# Patient Record
Sex: Female | Born: 1940 | Race: White | Hispanic: No | Marital: Married | Smoking: Current every day smoker
Health system: Southern US, Community
[De-identification: ages and names within clinical notes are randomized; demographics above are authoritative.]

## PROBLEM LIST (undated history)

## (undated) DIAGNOSIS — J449 Chronic obstructive pulmonary disease, unspecified: Secondary | ICD-10-CM

## (undated) DIAGNOSIS — A419 Sepsis, unspecified organism: Secondary | ICD-10-CM

## (undated) DIAGNOSIS — IMO0002 Reserved for concepts with insufficient information to code with codable children: Secondary | ICD-10-CM

## (undated) DIAGNOSIS — K2970 Gastritis, unspecified, without bleeding: Secondary | ICD-10-CM

## (undated) DIAGNOSIS — I209 Angina pectoris, unspecified: Secondary | ICD-10-CM

## (undated) DIAGNOSIS — E7800 Pure hypercholesterolemia, unspecified: Secondary | ICD-10-CM

## (undated) DIAGNOSIS — F419 Anxiety disorder, unspecified: Secondary | ICD-10-CM

## (undated) DIAGNOSIS — K224 Dyskinesia of esophagus: Secondary | ICD-10-CM

## (undated) DIAGNOSIS — K5792 Diverticulitis of intestine, part unspecified, without perforation or abscess without bleeding: Secondary | ICD-10-CM

## (undated) DIAGNOSIS — G2581 Restless legs syndrome: Secondary | ICD-10-CM

## (undated) DIAGNOSIS — I10 Essential (primary) hypertension: Secondary | ICD-10-CM

## (undated) DIAGNOSIS — K589 Irritable bowel syndrome without diarrhea: Secondary | ICD-10-CM

## (undated) DIAGNOSIS — J45909 Unspecified asthma, uncomplicated: Secondary | ICD-10-CM

## (undated) DIAGNOSIS — K219 Gastro-esophageal reflux disease without esophagitis: Secondary | ICD-10-CM

## (undated) DIAGNOSIS — IMO0001 Reserved for inherently not codable concepts without codable children: Secondary | ICD-10-CM

## (undated) DIAGNOSIS — I2510 Atherosclerotic heart disease of native coronary artery without angina pectoris: Secondary | ICD-10-CM

## (undated) HISTORY — DX: Anxiety disorder, unspecified: F41.9

## (undated) HISTORY — DX: Gastritis, unspecified, without bleeding: K29.70

## (undated) HISTORY — DX: Irritable bowel syndrome, unspecified: K58.9

## (undated) HISTORY — PX: ABDOMINAL HYSTERECTOMY: SHX81

## (undated) HISTORY — DX: Pure hypercholesterolemia, unspecified: E78.00

## (undated) HISTORY — DX: Essential (primary) hypertension: I10

## (undated) HISTORY — PX: OTHER SURGICAL HISTORY: SHX169

## (undated) HISTORY — PX: HERNIA REPAIR: SHX51

## (undated) HISTORY — DX: Chronic obstructive pulmonary disease, unspecified: J44.9

## (undated) HISTORY — DX: Dyskinesia of esophagus: K22.4

## (undated) HISTORY — PX: BLADDER SURGERY: SHX569

## (undated) HISTORY — DX: Diverticulitis of intestine, part unspecified, without perforation or abscess without bleeding: K57.92

## (undated) HISTORY — PX: APPENDECTOMY: SHX54

## (undated) HISTORY — DX: Reserved for concepts with insufficient information to code with codable children: IMO0002

## (undated) HISTORY — PX: CORONARY STENT PLACEMENT: SHX1402

---

## 2000-01-07 ENCOUNTER — Encounter: Payer: Self-pay | Admitting: Cardiology

## 2000-01-07 ENCOUNTER — Inpatient Hospital Stay (HOSPITAL_COMMUNITY): Admission: AD | Admit: 2000-01-07 | Discharge: 2000-01-09 | Payer: Self-pay | Admitting: Cardiology

## 2000-01-09 ENCOUNTER — Encounter: Payer: Self-pay | Admitting: Cardiology

## 2005-08-02 ENCOUNTER — Ambulatory Visit: Payer: Self-pay | Admitting: Cardiology

## 2005-08-09 ENCOUNTER — Ambulatory Visit: Payer: Self-pay

## 2005-08-12 ENCOUNTER — Ambulatory Visit: Payer: Self-pay | Admitting: Cardiology

## 2005-09-23 ENCOUNTER — Ambulatory Visit: Payer: Self-pay | Admitting: Cardiology

## 2011-11-23 ENCOUNTER — Institutional Professional Consult (permissible substitution): Payer: Self-pay | Admitting: Cardiology

## 2011-12-21 ENCOUNTER — Ambulatory Visit (INDEPENDENT_AMBULATORY_CARE_PROVIDER_SITE_OTHER): Payer: Medicare PPO | Admitting: Pulmonary Disease

## 2011-12-21 ENCOUNTER — Encounter: Payer: Self-pay | Admitting: Pulmonary Disease

## 2011-12-21 VITALS — BP 108/62 | HR 62 | Temp 98.6°F | Ht 60.0 in | Wt 120.8 lb

## 2011-12-21 DIAGNOSIS — R911 Solitary pulmonary nodule: Secondary | ICD-10-CM | POA: Insufficient documentation

## 2011-12-21 DIAGNOSIS — J449 Chronic obstructive pulmonary disease, unspecified: Secondary | ICD-10-CM | POA: Insufficient documentation

## 2011-12-21 DIAGNOSIS — F17200 Nicotine dependence, unspecified, uncomplicated: Secondary | ICD-10-CM

## 2011-12-21 DIAGNOSIS — R490 Dysphonia: Secondary | ICD-10-CM | POA: Insufficient documentation

## 2011-12-21 DIAGNOSIS — Z720 Tobacco use: Secondary | ICD-10-CM | POA: Insufficient documentation

## 2011-12-21 MED ORDER — TIOTROPIUM BROMIDE MONOHYDRATE 18 MCG IN CAPS: 18.0000 ug | ORAL_CAPSULE | Freq: Every day | RESPIRATORY_TRACT | Status: DC

## 2011-12-21 NOTE — Assessment & Plan Note (Signed)
She has noticed more hoarseness since her recent hernia surgery in March 2013.  From her description it seems like she had endotracheal intubation for her procedure.  If her symptoms persist, she may need further evaluation by ENT.  Explained how smoking is likely affecting this as well.

## 2011-12-21 NOTE — Patient Instructions (Signed)
Will schedule PET scan Will schedule breathing test (PFT) Spiriva one puff daily Albuterol two puffs as needed for cough, wheeze, or chest congestion Follow up in 2 to 3 weeks

## 2011-12-21 NOTE — Assessment & Plan Note (Signed)
She was found to have pulmonary nodule through CT chest from June 2012 during research protocol at [facility].  She was noted to have persistence of lesion on CT abdomen from February 2013.  She has an extensive history of smoking.  She does not have any other imaging studies predating these tests available for review.  To further assess will arrange for PET scan.  Will then determine if further radiographic monitoring is indicated, or if she needs to proceed with tissue sampling.

## 2011-12-21 NOTE — Progress Notes (Deleted)
  Subjective:    Patient ID: Melinda Schroeder, female    DOB: 20-May-1941, 71 y.o.   MRN: [id number]  HPI    Review of Systems  Constitutional: Positive for appetite change and unexpected weight change.  HENT: Negative for congestion, sore throat, trouble swallowing and dental problem.   Respiratory: Positive for cough. Negative for shortness of breath.   Cardiovascular: Negative for chest pain, palpitations and leg swelling.  Gastrointestinal: Negative for abdominal pain.  Musculoskeletal: Negative for joint swelling.  Skin: Negative for rash.  Neurological: Negative for headaches.  Psychiatric/Behavioral: Negative for dysphoric mood. The patient is nervous/anxious.        Objective:   Physical Exam        Assessment & Plan:

## 2011-12-21 NOTE — Assessment & Plan Note (Signed)
She had spirometry in June 2012 which showed severe obstruction.  To further assess will arrange for full pulmonary function testing.  Will add spiriva to her inhaler regimen.  I have given her a sample of this.  Will continue albuterol as needed.

## 2011-12-21 NOTE — Assessment & Plan Note (Signed)
Explained the importance of smoking cessation.  Reviewed different treatment options.  She was intolerant of chantix, and did not have benefit from nicotine patch.  She will think about her options and discuss further at next visit.

## 2011-12-21 NOTE — Progress Notes (Signed)
Chief Complaint  Patient presents with  . Advice Only    Lung mass per Dr. Veatrice Kells    History of Present Illness: Melinda Schroeder is a 71 y.o. female for evaluation of pulmonary nodule.  She has extensive history of smoking.  She started smoking in her 57's.  She smoked up to 1 pack per day, and currently smokes 1/2 pack per day.    She is enrolled in a research protocol through [facility].  She had CT chest through the protocol from June 2012.  This apparently was not reviewed until January 2013.  This showed 1.3 cm right lower lobe spiculated nodule.  She had CT abdomen in February 2013 prior to hernia surgery which showed persistence of right lower lobe 1.1 cm nodule.  She was then advised to have further evaluation with pulmonary.  She does not recall having any other chest imaging studies prior to June 2012.    She also had spirometry in June 2012 which showed severe obstruction (PFT 03/02/11>>FEV1 0.98(51%), FEV1% 58).  She has an albuterol inhaler, but does not use this much.  She gets occasional cough.  She does not have wheeze, sputum, or hemoptysis.  She gets winded if she walks up stairs, but does okay on level ground.  She does not think her breathing limits her activity.  She has lost about 10 lbs over the past year.    She has noticed more trouble with hoarseness since she had her hernia surgery in March 2013.  She believes she was intubated for the procedure.  There is no history of asthma.  She had pneumonia years ago.  She denies history of TB.  She recalls having PPD at work years ago, and was told this was negative. She gets bronchitis about once per year.  She uses to work in [employer], and now works as a Interior and spatial designer.  She has a Development worker, international aid, and denies other animal exposures.   Past Medical History  Diagnosis Date  . Diverticulitis   . Gastritis   . Hypercholesterolemia   . Hypertension   . Anxiety   . COPD (chronic obstructive pulmonary disease)     . DDD (degenerative disc disease)   . IBS (irritable bowel syndrome)   . Gout   . Esophageal dysmotilities     Past Surgical History  Procedure Date  . Abdominal hysterectomy   . Bladder surgery   . Breast mass right excision   . Hernia repair   . Coronary stent placement     Current Outpatient Prescriptions on File Prior to Visit  Medication Sig Dispense Refill  . albuterol (PROVENTIL HFA;VENTOLIN HFA) 108 (90 BASE) MCG/ACT inhaler Inhale 2 puffs into the lungs every 6 (six) hours as needed.      [name] losartan-hydrochlorothiazide (HYZAAR) 100-12.5 MG per tablet Take 1 tablet by mouth daily.      . metoprolol succinate (TOPROL-XL) 100 MG 24 hr tablet Take 100 mg by mouth 2 (two) times daily. Take with or immediately following a meal.      . omeprazole (PRILOSEC) 20 MG capsule Take 20 mg by mouth daily.      [name] PARoxetine (PAXIL) 20 MG tablet Take 20 mg by mouth daily.      . simvastatin (ZOCOR) 80 MG tablet Take 80 mg by mouth at bedtime.        Allergies  Allergen Reactions  . Ace Inhibitors     cough  . Sulfa Antibiotics  nausea    family history includes Emphysema in her father; Stomach cancer in her brother; and Stroke in her mother.   reports that she has been smoking.  She does not have any smokeless tobacco history on file. She reports that she does not drink alcohol or use illicit drugs.  Review of Systems  Constitutional: Positive for appetite change and unexpected weight change.  HENT: Negative for congestion, sore throat, trouble swallowing and dental problem.   Respiratory: Positive for cough. Negative for shortness of breath.   Cardiovascular: Negative for chest pain, palpitations and leg swelling.  Gastrointestinal: Negative for abdominal pain.  Musculoskeletal: Negative for joint swelling.  Skin: Negative for rash.  Neurological: Negative for headaches.  Psychiatric/Behavioral: Negative for dysphoric mood. The patient is nervous/anxious.     Physical  Exam: BP 108/62  Pulse 62  Temp(Src) 98.6 F (37 C) (Oral)  Ht 5' (1.524 m)  Wt 120 lb 12.8 oz (54.795 kg)  BMI 23.59 kg/m2  SpO2 96% Body mass index is 23.59 kg/(m^2).  General - Thin, no distress, speaks in full sentences, deep voice quality HEENT - Wears glasses, PERRLA, EOMI, wears glasses, no sinus tenderness, no oral exudate, MP 3, no LAN Cardiac - s1s2 regular, no murmur Chest - prolonged exhalation, normal respiratory excursion, no wheeze/rales/dullness Abdomen - soft, nontender, normal bowel sounds Extremities - no e/c/c Neurologic - normal strength, CN intact Skin - no rashes Psychiatric - normal mood, behavior  Assessment/Plan:  Outpatient Encounter Prescriptions as of 12/21/2011  Medication Sig Dispense Refill  . albuterol (PROVENTIL HFA;VENTOLIN HFA) 108 (90 BASE) MCG/ACT inhaler Inhale 2 puffs into the lungs every 6 (six) hours as needed.      [name] losartan-hydrochlorothiazide (HYZAAR) 100-12.5 MG per tablet Take 1 tablet by mouth daily.      . metoprolol succinate (TOPROL-XL) 100 MG 24 hr tablet Take 100 mg by mouth 2 (two) times daily. Take with or immediately following a meal.      . omeprazole (PRILOSEC) 20 MG capsule Take 20 mg by mouth daily.      [name] PARoxetine (PAXIL) 20 MG tablet Take 20 mg by mouth daily.      . simvastatin (ZOCOR) 80 MG tablet Take 80 mg by mouth at bedtime.        Melinda Schroeder Pager:  [phone] 12/21/2011, 4:22 PM

## 2012-01-03 ENCOUNTER — Encounter (HOSPITAL_COMMUNITY)
Admission: RE | Admit: 2012-01-03 | Discharge: 2012-01-03 | Disposition: A | Discharge status: Home / Self Care | Payer: Medicare PPO | Source: Ambulatory Visit | Attending: Pulmonary Disease | Admitting: Pulmonary Disease

## 2012-01-03 DIAGNOSIS — R222 Localized swelling, mass and lump, trunk: Secondary | ICD-10-CM | POA: Insufficient documentation

## 2012-01-03 DIAGNOSIS — R911 Solitary pulmonary nodule: Secondary | ICD-10-CM

## 2012-01-03 DIAGNOSIS — J984 Other disorders of lung: Secondary | ICD-10-CM | POA: Insufficient documentation

## 2012-01-03 LAB — GLUCOSE, CAPILLARY: Glucose-Capillary: 103 mg/dL — ABNORMAL HIGH (ref 70–99)

## 2012-01-03 MED ORDER — FLUDEOXYGLUCOSE F - 18 (FDG) INJECTION
18.4000 | Freq: Once | INTRAVENOUS | Status: AC | PRN
  Administered 2012-01-03: 18.4 via INTRAVENOUS

## 2012-01-04 ENCOUNTER — Telehealth: Payer: Self-pay | Admitting: Pulmonary Disease

## 2012-01-04 NOTE — Telephone Encounter (Signed)
Nm Pet Image Initial (pi) Skull Base To Thigh  01/03/2012  *RADIOLOGY REPORT*  Clinical Data:  Recent imaging demonstrates a solitary pulmonary nodule.  FDG PET CT requested to evaluate for possible malignancy.  NUCLEAR MEDICINE PET SKULL BASE TO THIGH  Fasting Blood Glucose:  103  Technique:  18.4 mCi F-18 FDG was injected intravenously.  CT data was obtained and used for attenuation correction and anatomic localization only.  (This was not acquired as a diagnostic CT examination.) Additional exam technical data entered on technologist worksheet.  Comparison:  None  Findings:  Head/Neck:  No hypermetabolic lymph nodes in the neck.  Chest:  12 mm smoothly marginated pulmonary nodule in the right lung base shows no hypermetabolic activity, suggesting a benign etiology.  No other suspicious pulmonary nodules are identified on CT images.  No hypermetabolic mediastinal or hilar nodes.  Abdomen/Pelvis:  No abnormal hypermetabolic activity within the liver, pancreas, adrenal glands, or spleen.  No hypermetabolic lymph nodes in the abdomen or pelvis.  Skeleton:  No focal hypermetabolic activity to suggest skeletal metastasis.  IMPRESSION: 12 mm right lower lobe pulmonary nodule shows no metabolic activity, suggesting a benign etiology.  Follow-up by chest CT is recommended in 6 months to confirm stability of this nodule.  Original Report Authenticated By: Danae Orleans, M.D.    Will have my nurse inform patient that PET scan was negative.  This is very reassuring.  Will discuss in more detail at visit on 01/07/12.

## 2012-01-04 NOTE — Telephone Encounter (Signed)
[id number] x1 

## 2012-01-05 NOTE — Telephone Encounter (Signed)
Pt informed of PET scan results per Dr Craige Cotta and will f/u with him in office on 01-07-12.

## 2012-01-07 ENCOUNTER — Ambulatory Visit (INDEPENDENT_AMBULATORY_CARE_PROVIDER_SITE_OTHER): Payer: Medicare PPO | Admitting: Pulmonary Disease

## 2012-01-07 ENCOUNTER — Encounter: Payer: Self-pay | Admitting: Pulmonary Disease

## 2012-01-07 VITALS — BP 132/70 | HR 66 | Temp 98.0°F | Ht 60.0 in | Wt 118.0 lb

## 2012-01-07 DIAGNOSIS — J449 Chronic obstructive pulmonary disease, unspecified: Secondary | ICD-10-CM

## 2012-01-07 DIAGNOSIS — Z720 Tobacco use: Secondary | ICD-10-CM

## 2012-01-07 DIAGNOSIS — J4489 Other specified chronic obstructive pulmonary disease: Secondary | ICD-10-CM

## 2012-01-07 DIAGNOSIS — R911 Solitary pulmonary nodule: Secondary | ICD-10-CM

## 2012-01-07 DIAGNOSIS — R490 Dysphonia: Secondary | ICD-10-CM

## 2012-01-07 DIAGNOSIS — F17200 Nicotine dependence, unspecified, uncomplicated: Secondary | ICD-10-CM

## 2012-01-07 LAB — PULMONARY FUNCTION TEST

## 2012-01-07 NOTE — Assessment & Plan Note (Signed)
Her PFT today is better compared to her spirometry from June 2012.  She has GOLD 1 COPD.  She does not have much as far as respiratory symptoms.  Will have her stop spiriva.  She can continue as needed albuterol.

## 2012-01-07 NOTE — Assessment & Plan Note (Signed)
Improved.  Will monitor clinically, and then decide if she needs evaluation by ENT.

## 2012-01-07 NOTE — Patient Instructions (Signed)
Stop spiriva Follow up in 6 months after CT chest

## 2012-01-07 NOTE — Progress Notes (Signed)
PFT done today. 

## 2012-01-07 NOTE — Progress Notes (Signed)
Chief Complaint  Patient presents with  . Follow-up    w/ PFT. Pt states her breathing has been fine. pt also wants to discuss pet scan    History of Present Illness: Melinda Schroeder is a 71 y.o. female smoker with COPD, pulmonary nodule, and hoarseness.  Her breathing has been okay.  She is not having cough, wheeze, chest congestion, or chest tightness.  She does not feel like her breathing causes trouble with her activity.  She is not using her spiriva much.  She does not think this helps much.   Past Medical History  Diagnosis Date  . Diverticulitis   . Gastritis   . Hypercholesterolemia   . Hypertension   . Anxiety   . COPD (chronic obstructive pulmonary disease)   . DDD (degenerative disc disease)   . IBS (irritable bowel syndrome)   . Gout   . Esophageal dysmotilities     Past Surgical History  Procedure Date  . Abdominal hysterectomy   . Bladder surgery   . Breast mass right excision   . Hernia repair   . Coronary stent placement     Allergies  Allergen Reactions  . Ace Inhibitors     cough  . Sulfa Antibiotics     nausea    Physical Exam:  Blood pressure 132/70, pulse 66, temperature 98 F (36.7 C), temperature source Oral, height 5' (1.524 m), weight 118 lb (53.524 kg), SpO2 95.00%. Body mass index is 23.05 kg/(m^2). Wt Readings from Last 2 Encounters:  01/07/12 118 lb (53.524 kg)  12/21/11 120 lb 12.8 oz (54.795 kg)    General - Thin, no distress, speaks in full sentences, deep voice quality  HEENT - Wears glasses, PERRLA, EOMI, wears glasses, no sinus tenderness, no oral exudate, MP 3, no LAN  Cardiac - s1s2 regular, no murmur  Chest - prolonged exhalation, normal respiratory excursion, no wheeze/rales/dullness  Abdomen - soft, nontender, normal bowel sounds  Extremities - no e/c/c  Neurologic - normal strength, CN intact  Skin - no rashes  Psychiatric - normal mood, behavior   PFT 01/07/12>>FEV1 1.55 (92%), FEV1% 60, TLC 4.89 (122%), DLCO 66%,  no BD   Nm Pet Image Initial (pi) Skull Base To Thigh  01/03/2012  *RADIOLOGY REPORT*  Clinical Data:  Recent imaging demonstrates a solitary pulmonary nodule.   FDG PET CT requested to evaluate for possible malignancy.  NUCLEAR MEDICINE PET SKULL BASE TO THIGH  Fasting Blood Glucose:  103  Technique:  18.4 mCi F-18 FDG was injected intravenously.  CT data was obtained and used for attenuation correction and anatomic localization only.  (This was not acquired as a diagnostic CT examination.) Additional exam technical data entered on technologist worksheet.   Comparison:  None   Findings:  Head/Neck:  No hypermetabolic lymph nodes in the neck.  Chest:  12 mm smoothly marginated pulmonary nodule in the right lung base shows no hypermetabolic activity, suggesting a benign etiology.  No other suspicious pulmonary nodules are identified on CT images.  No hypermetabolic mediastinal or hilar nodes.  Abdomen/Pelvis:  No abnormal hypermetabolic activity within the liver, pancreas, adrenal glands, or spleen.  No hypermetabolic lymph nodes in the abdomen or pelvis.  Skeleton:  No focal hypermetabolic activity to suggest skeletal metastasis.   IMPRESSION: 12 mm right lower lobe pulmonary nodule shows no metabolic activity, suggesting a benign etiology.  Follow-up by chest CT is recommended in 6 months to confirm stability of this nodule.   Original Report Authenticated By:  Danae Orleans, M.D.    Assessment/Plan:  Outpatient Encounter Prescriptions as of 01/07/2012  Medication Sig Dispense Refill  . albuterol (PROVENTIL HFA;VENTOLIN HFA) 108 (90 BASE) MCG/ACT inhaler Inhale 2 puffs into the lungs every 6 (six) hours as needed.      [name] HYDROcodone-acetaminophen (NORCO) 7.5-325 MG per tablet As needed for back pain      . losartan-hydrochlorothiazide (HYZAAR) 100-12.5 MG per tablet Take 1 tablet by mouth daily.      . metoprolol succinate (TOPROL-XL) 100 MG 24 hr tablet Take 100 mg by mouth 2 (two) times daily.  Take with or immediately following a meal.      . omeprazole (PRILOSEC) 20 MG capsule Take 20 mg by mouth daily.      [name] PARoxetine (PAXIL) 20 MG tablet Take 20 mg by mouth daily.      . simvastatin (ZOCOR) 80 MG tablet Take 80 mg by mouth at bedtime.      [name] tiotropium (SPIRIVA HANDIHALER) 18 MCG inhalation capsule Place 1 capsule (18 mcg total) into inhaler and inhale daily.  30 capsule  5    [name] Pager:  [phone] 01/07/2012, 1:31 PM

## 2012-01-07 NOTE — Assessment & Plan Note (Signed)
PET scan was re-assuring.  Will need f/u CT chest in 6 months w/o contrast to monitor.

## 2012-01-07 NOTE — Assessment & Plan Note (Signed)
Explained the importance of smoking cessation.  Reviewed different treatment options.  She was intolerant of chantix, and did not have benefit from nicotine patch.  She will think about her options.

## 2012-01-17 ENCOUNTER — Ambulatory Visit: Payer: Medicare PPO | Admitting: Pulmonary Disease

## 2012-06-20 ENCOUNTER — Ambulatory Visit (INDEPENDENT_AMBULATORY_CARE_PROVIDER_SITE_OTHER)
Admission: RE | Admit: 2012-06-20 | Discharge: 2012-06-20 | Disposition: A | Discharge status: Home / Self Care | Payer: Medicare PPO | Source: Ambulatory Visit | Attending: Pulmonary Disease | Admitting: Pulmonary Disease

## 2012-06-20 DIAGNOSIS — R911 Solitary pulmonary nodule: Secondary | ICD-10-CM

## 2012-06-21 ENCOUNTER — Telehealth: Payer: Self-pay | Admitting: Pulmonary Disease

## 2012-06-21 NOTE — Telephone Encounter (Signed)
Ct Chest Wo Contrast  06/20/2012  *RADIOLOGY REPORT*  Clinical Data: Follow-up evaluation of pulmonary nodule.  History of smoking.  CT CHEST WITHOUT CONTRAST  Technique:  Multidetector CT imaging of the chest was performed following the standard protocol without IV contrast.  Comparison: PET CT 01/03/2012.  Findings:  Mediastinum: Heart size is normal. There is no significant pericardial fluid, thickening or pericardial calcification. There is atherosclerosis of the thoracic aorta, the great vessels of the mediastinum and the coronary arteries, including calcified atherosclerotic plaque in the left main, left anterior descending, left circumflex and right coronary arteries. No pathologically enlarged mediastinal or hilar lymph nodes. Please note that accurate exclusion of hilar adenopathy is limited on noncontrast CT scans.  Esophagus is unremarkable in appearance.  Lungs/Pleura:  In the inferior aspect of the anterobasal segment of the right lower lobe there is again a pleural based 11 mm nodule which has relatively well defined smooth borders.  No other definite suspicious-appearing pulmonary nodules or masses are otherwise noted. Mild diffuse bronchial wall thickening with mild centrilobular emphysema.  No acute consolidative airspace disease. No pleural effusions.  Upper Abdomen: Unremarkable.  Musculoskeletal: There are no aggressive appearing lytic or blastic lesions noted in the visualized portions of the skeleton.  IMPRESSION: 1.  11 mm smoothly marginated pleural based nodule in the right lower lobe is unchanged in size and appearance compared to prior examinations.  Given the lesion's stability and the lack of hypermetabolism on prior PET CT 01/03/2012, this is favored to be a benign lesion.  However, continued attention on follow-up studies until at least 2 years of stability has been documented is recommended. At this point, a follow-up chest CT in 6 months is recommended. 2. Atherosclerosis, including  left main and three-vessel coronary artery disease. Assessment for potential risk factor modification, dietary therapy or pharmacologic therapy may be warranted, if clinically indicated. 3.  Mild diffuse bronchial wall thickening with mild centrilobular emphysema; findings suggestive of very mild COPD.   Original Report Authenticated By: Florencia Reasons, M.D.     Discussed results with pt over phone.  Will discuss in more detail at next ROV later this month.

## 2012-07-10 ENCOUNTER — Encounter: Payer: Self-pay | Admitting: Pulmonary Disease

## 2012-07-10 ENCOUNTER — Ambulatory Visit (INDEPENDENT_AMBULATORY_CARE_PROVIDER_SITE_OTHER): Payer: Medicare PPO | Admitting: Pulmonary Disease

## 2012-07-10 VITALS — BP 126/62 | HR 63 | Temp 98.3°F | Ht 60.0 in | Wt 119.0 lb

## 2012-07-10 DIAGNOSIS — J449 Chronic obstructive pulmonary disease, unspecified: Secondary | ICD-10-CM

## 2012-07-10 DIAGNOSIS — R911 Solitary pulmonary nodule: Secondary | ICD-10-CM

## 2012-07-10 DIAGNOSIS — Z720 Tobacco use: Secondary | ICD-10-CM

## 2012-07-10 DIAGNOSIS — F17200 Nicotine dependence, unspecified, uncomplicated: Secondary | ICD-10-CM

## 2012-07-10 NOTE — Assessment & Plan Note (Signed)
She will discuss smoking cessation options with her PCP.

## 2012-07-10 NOTE — Progress Notes (Signed)
Chief Complaint  Patient presents with  . Follow-up    breathing is good. denies any wheezing, chest tx, cough. discuss CT more in detail    History of Present Illness: Melinda Schroeder is a 71 y.o. female smoker with COPD, and  pulmonary nodule.  She is here to review her CT chest.  Her breathing is doing okay.  She does not have cough, wheeze, or sputum.  She denies chest pain or leg swelling.  She used her inhaler once when she was raking leaves.    She continues to smoke about 1 pack per day.  Tests: CT chest [facility]) 03/02/11>>1.3 cm spiculated nodule RLL CT abd/pelvis [facility]) 11/04/11>>1.1 cm nodule RLL PET scan 01/03/12>>12 mm RLL nodule no activity. PFT 01/07/12>>FEV1 1.55 (92%), FEV1% 60, TLC 4.89 (122%), DLCO 66%, no BD CT chest 06/20/12>>no change to 1.1 cm RLL nodule, mild centrilobular emphysema  Past Medical History  Diagnosis Date  . Diverticulitis   . Gastritis   . Hypercholesterolemia   . Hypertension   . Anxiety   . COPD (chronic obstructive pulmonary disease)   . DDD (degenerative disc disease)   . IBS (irritable bowel syndrome)   . Gout   . Esophageal dysmotilities     Past Surgical History  Procedure Date  . Abdominal hysterectomy   . Bladder surgery   . Breast mass right excision   . Hernia repair   . Coronary stent placement     Allergies  Allergen Reactions  . Ace Inhibitors     cough  . Sulfa Antibiotics     nausea    Physical Exam:  Filed Vitals:   07/10/12 1120 07/10/12 1122  BP:  126/62  Pulse:  63  Temp: 98.3 F (36.8 C)   TempSrc: Oral   Height: 5' (1.524 m)   Weight: 119 lb (53.978 kg)   SpO2:  97%   Body mass index is 23.24 kg/(m^2).  Wt Readings from Last 2 Encounters:  07/10/12 119 lb (53.978 kg)  01/07/12 118 lb (53.524 kg)    General - Thin, deep voice quality  HEENT - Wears glasses, no sinus tenderness, no oral exudate, MP 3, no LAN  Cardiac - s1s2 regular, no murmur  Chest - prolonged exhalation,  normal respiratory excursion, no wheeze/rales/dullness  Abdomen - soft, nontender Extremities - no e/c/c  Neurologic - normal strength Skin - no rashes  Psychiatric - normal mood, behavior  06/20/2012 *RADIOLOGY REPORT*  Clinical Data: Follow-up evaluation of pulmonary nodule. History of smoking.  CT CHEST WITHOUT CONTRAST Technique: Multidetector CT imaging of the chest was performed following the standard protocol without IV contrast.  Comparison: PET CT 01/03/2012.  Findings: Mediastinum: Heart size is normal. There is no significant pericardial fluid, thickening or pericardial calcification. There is atherosclerosis of the thoracic aorta, the great vessels of the mediastinum and the coronary arteries, including calcified atherosclerotic plaque in the left main, left anterior descending, left circumflex and right coronary arteries. No pathologically enlarged mediastinal or hilar lymph nodes. Please note that accurate exclusion of hilar adenopathy is limited on noncontrast CT scans. Esophagus is unremarkable in appearance. Lungs/Pleura: In the inferior aspect of the anterobasal segment of the right lower lobe there is again a pleural based 11 mm nodule which has relatively well defined smooth borders. No other definite suspicious-appearing pulmonary nodules or masses are otherwise noted. Mild diffuse bronchial wall thickening with mild centrilobular emphysema. No acute consolidative airspace disease. No pleural effusions. Upper Abdomen: Unremarkable. Musculoskeletal: There are  no aggressive appearing lytic or blastic lesions noted in the visualized portions of the skeleton.  IMPRESSION: 1. 11 mm smoothly marginated pleural based nodule in the right lower lobe is unchanged in size and appearance compared to prior examinations. Given the lesion's stability and the lack of hypermetabolism on prior PET CT 01/03/2012, this is favored to be a benign lesion. However, continued attention on follow-up studies  until at least 2 years of stability has been documented is recommended. At this point, a follow-up chest CT in 6 months is recommended. 2. Atherosclerosis, including left main and three-vessel coronary artery disease. Assessment for potential risk factor modification, dietary therapy or pharmacologic therapy may be warranted, if clinically indicated. 3. Mild diffuse bronchial wall thickening with mild centrilobular emphysema; findings suggestive of very mild COPD.  Original Report Authenticated By: Florencia Reasons, M.D.    Assessment/Plan:  Outpatient Encounter Prescriptions as of 07/10/2012  Medication Sig Dispense Refill  . albuterol (PROVENTIL HFA;VENTOLIN HFA) 108 (90 BASE) MCG/ACT inhaler Inhale 2 puffs into the lungs every 6 (six) hours as needed.      [name] HYDROcodone-acetaminophen (NORCO) 7.5-325 MG per tablet As needed for back pain      . losartan-hydrochlorothiazide (HYZAAR) 100-12.5 MG per tablet Take 1 tablet by mouth daily.      . metoprolol succinate (TOPROL-XL) 100 MG 24 hr tablet Take 100 mg by mouth 2 (two) times daily. Take with or immediately following a meal.      . omeprazole (PRILOSEC) 20 MG capsule Take 20 mg by mouth daily.      [name] PARoxetine (PAXIL) 20 MG tablet Take 20 mg by mouth daily.      . simvastatin (ZOCOR) 80 MG tablet Take 80 mg by mouth at bedtime.        Yunior Jain Pager:  [phone] 07/10/2012, 11:46 AM

## 2012-07-10 NOTE — Patient Instructions (Signed)
Will schedule CT chest for April 2014 Follow up after CT chest in April 2014

## 2012-07-10 NOTE — Assessment & Plan Note (Signed)
She has minimal symptoms.  Continue prn albuterol.

## 2012-07-10 NOTE — Assessment & Plan Note (Signed)
Stable on CT chest from October 2013.  Will schedule f/u non contrast CT chest for April 2014 which would complete 2 year radiographic monitoring.

## 2012-12-07 ENCOUNTER — Telehealth: Payer: Self-pay | Admitting: Pulmonary Disease

## 2012-12-07 NOTE — Telephone Encounter (Signed)
I spoke with pt. She stated she is scheduled for a CT chest over at [facility] in June for study she is in. Per pt Dr. Craige Cotta wants her to have one her in April. She is just wanting to have the one done over at [facility] but wants to make sure Dr. Craige Cotta is okay with this. Please advise Dr. Craige Cotta thanks

## 2012-12-07 NOTE — Telephone Encounter (Signed)
I spoke with pt and she is aware. She will call once she has CT done for f/u. Nothing further was needed

## 2012-12-07 NOTE — Telephone Encounter (Signed)
Okay to defer CT chest in April, and have CT chest at [facility] in June.  Please reschedule her follow up for end of June 2014.

## 2013-04-04 ENCOUNTER — Telehealth: Payer: Self-pay | Admitting: Pulmonary Disease

## 2013-04-04 DIAGNOSIS — R911 Solitary pulmonary nodule: Secondary | ICD-10-CM

## 2013-04-04 NOTE — Telephone Encounter (Signed)
Spoke with patient Patient has CT scheduled for July 29 Patient requesting to be seen the week following that scan (week of Aug.4) Patient requesting that Monday Aug 4 or Wed Aug 6 d/t other appts No openings-- Lillia Abed please advise if patient can be worked in to either of these days thanks!

## 2013-04-04 NOTE — Telephone Encounter (Signed)
Spoke with the pt  She states that she is ready to set up her CT Chest  Order sent to [facility] for this and she will schedule ov with VS afterwards Nothing further needed

## 2013-04-05 NOTE — Telephone Encounter (Signed)
Pt has been scheduled for 04/16/2013 at 1:30pm.

## 2013-04-16 ENCOUNTER — Encounter: Payer: Self-pay | Admitting: Pulmonary Disease

## 2013-04-16 ENCOUNTER — Ambulatory Visit (INDEPENDENT_AMBULATORY_CARE_PROVIDER_SITE_OTHER): Payer: Medicare Other | Admitting: Pulmonary Disease

## 2013-04-16 VITALS — BP 102/56 | HR 63 | Temp 98.0°F | Ht 60.0 in | Wt 112.8 lb

## 2013-04-16 DIAGNOSIS — R911 Solitary pulmonary nodule: Secondary | ICD-10-CM

## 2013-04-16 DIAGNOSIS — J4489 Other specified chronic obstructive pulmonary disease: Secondary | ICD-10-CM

## 2013-04-16 DIAGNOSIS — F17200 Nicotine dependence, unspecified, uncomplicated: Secondary | ICD-10-CM

## 2013-04-16 DIAGNOSIS — J449 Chronic obstructive pulmonary disease, unspecified: Secondary | ICD-10-CM

## 2013-04-16 DIAGNOSIS — Z720 Tobacco use: Secondary | ICD-10-CM

## 2013-04-16 NOTE — Assessment & Plan Note (Signed)
Stable on CT chest since 2012.  No additional radiographic follow up needed.

## 2013-04-16 NOTE — Progress Notes (Signed)
Chief Complaint  Patient presents with  . Follow-up    Pt here to discuss CT results. Pt reports her breathing has been fine. Denies any wheezing, chest tx, and no cough    History of Present Illness: Melinda Schroeder is a 72 y.o. female smoker with COPD, and  pulmonary nodule.  She is here to review her CT chest.  She continues to smoke 1/2 pack per day.  She denies cough, wheeze, sputum, or chest pain.  She does not use her albuterol.  She does not feel her breathing limits her activity.  She denies leg swelling.  Tests: CT chest [facility]) 03/02/11>>1.3 cm spiculated nodule RLL CT abd/pelvis [facility]) 11/04/11>>1.1 cm nodule RLL PET scan 01/03/12>>12 mm RLL nodule no activity. PFT 01/07/12>>FEV1 1.55 (92%), FEV1% 60, TLC 4.89 (122%), DLCO 66%, no BD CT chest 06/20/12>>no change to 1.1 cm RLL nodule, mild centrilobular emphysema CT chest 04/10/13 >> no change in RLL nodule  She  has a past medical history of Diverticulitis; Gastritis; Hypercholesterolemia; Hypertension; Anxiety; COPD (chronic obstructive pulmonary disease); DDD (degenerative disc disease); IBS (irritable bowel syndrome); Gout; and Esophageal dysmotilities.  She  has past surgical history that includes Abdominal hysterectomy; Bladder surgery; breast mass right excision; Hernia repair; and Coronary stent placement.  Allergies  Allergen Reactions  . Ace Inhibitors     cough  . Sulfa Antibiotics     nausea    Physical Exam:  General - Thin, deep voice quality  HEENT - Wears glasses, no sinus tenderness, no oral exudate, MP 3, no LAN  Cardiac - s1s2 regular, no murmur  Chest - prolonged exhalation, normal respiratory excursion, no wheeze/rales/dullness  Abdomen - soft, nontender Extremities - no e/c/c  Neurologic - normal strength Skin - no rashes  Psychiatric - normal mood, behavior  Assessment/Plan:  Siddh Vandeventer Pager:  [phone] 04/16/2013, 1:58 PM

## 2013-04-16 NOTE — Patient Instructions (Signed)
Follow up as needed

## 2013-04-16 NOTE — Assessment & Plan Note (Signed)
Minimal symptoms.  She can use albuterol as needed.

## 2013-04-16 NOTE — Assessment & Plan Note (Signed)
Discussed different options.  She has tried chantix >> caused hallucinations.  She would like to speak with her PCP about option of burpopion.

## 2013-04-27 ENCOUNTER — Encounter: Payer: Self-pay | Admitting: Pulmonary Disease

## 2013-05-23 ENCOUNTER — Other Ambulatory Visit: Payer: Self-pay | Admitting: Neurosurgery

## 2013-05-28 ENCOUNTER — Encounter (HOSPITAL_COMMUNITY): Payer: Self-pay | Admitting: Pharmacy Technician

## 2013-05-29 ENCOUNTER — Encounter (HOSPITAL_COMMUNITY)
Admission: RE | Admit: 2013-05-29 | Discharge: 2013-05-29 | Disposition: A | Discharge status: Home / Self Care | Payer: Medicare Other | Source: Ambulatory Visit | Attending: Neurosurgery | Admitting: Neurosurgery

## 2013-05-29 ENCOUNTER — Encounter (HOSPITAL_COMMUNITY): Payer: Self-pay

## 2013-05-29 DIAGNOSIS — Z01810 Encounter for preprocedural cardiovascular examination: Secondary | ICD-10-CM | POA: Insufficient documentation

## 2013-05-29 DIAGNOSIS — Z01818 Encounter for other preprocedural examination: Secondary | ICD-10-CM | POA: Insufficient documentation

## 2013-05-29 DIAGNOSIS — Z01812 Encounter for preprocedural laboratory examination: Secondary | ICD-10-CM | POA: Insufficient documentation

## 2013-05-29 HISTORY — DX: Gastro-esophageal reflux disease without esophagitis: K21.9

## 2013-05-29 HISTORY — DX: Angina pectoris, unspecified: I20.9

## 2013-05-29 HISTORY — DX: Unspecified asthma, uncomplicated: J45.909

## 2013-05-29 HISTORY — DX: Atherosclerotic heart disease of native coronary artery without angina pectoris: I25.10

## 2013-05-29 HISTORY — DX: Restless legs syndrome: G25.81

## 2013-05-29 LAB — CBC
Hemoglobin: 13.1 g/dL (ref 12.0–15.0)
MCH: 28.5 pg (ref 26.0–34.0)
MCHC: 32.9 g/dL (ref 30.0–36.0)
MCV: 86.5 fL (ref 78.0–100.0)
Platelets: 454 10*3/uL — ABNORMAL HIGH (ref 150–400)
RBC: 4.6 MIL/uL (ref 3.87–5.11)

## 2013-05-29 LAB — SURGICAL PCR SCREEN
MRSA, PCR: NEGATIVE
Staphylococcus aureus: NEGATIVE

## 2013-05-29 LAB — BASIC METABOLIC PANEL
BUN: 9 mg/dL (ref 6–23)
CO2: 29 mEq/L (ref 19–32)
Calcium: 9.4 mg/dL (ref 8.4–10.5)
Glucose, Bld: 114 mg/dL — ABNORMAL HIGH (ref 70–99)
Sodium: 133 mEq/L — ABNORMAL LOW (ref 135–145)

## 2013-05-29 NOTE — Pre-Procedure Instructions (Addendum)
Melinda Schroeder  05/29/2013   Your procedure is scheduled on:  Sept. 26 @0730    Report to [facility] at 0530 AM.  Call this number if you have problems the morning of surgery: [phone]   Remember:   Do not eat food or drink liquids after midnight.   Take these medicines the morning of surgery with A SIP OF WATER: Norco (Hydrocodone-acetaminophen) if needed,Toprol-xl (Metoprolol succinate), Prilosec (omerprazole), Paxil (Paroxetine, Spiriva  (tiotropium).  Stop taking Aspirin, Aleve, Ibuprofen, Fish oil, BC's, Goody's, Plavix, Coumadin, or any Herbal medications.   Do not wear jewelry, make-up or nail polish.  Do not wear lotions, powders, or perfumes. You may wear deodorant.  Do not shave 48 hours prior to surgery.  Do not bring valuables to the hospital.  [facility] is not responsible                   for any belongings or valuables.  Contacts, dentures or bridgework may not be worn into surgery.  Leave suitcase in the car. After surgery it may be brought to your room.  For patients admitted to the hospital, checkout time is 11:00 AM the day of  discharge.   Patients discharged the day of surgery will not be allowed to drive  home.    Special Instructions: Shower using CHG 2 nights before surgery and the night before surgery.  If you shower the day of surgery use CHG.  Use special wash - you have one bottle of CHG for all showers.  You should use approximately 1/3 of the bottle for each shower.   Please read over the following fact sheets that you were given: Pain Booklet, Coughing and Deep Breathing, Blood Transfusion Information and Surgical Site Infection Prevention

## 2013-05-29 NOTE — Progress Notes (Signed)
PCP is Amy Moon at [facility] is Dr Bing Matter at [facility] [phone]  Denies having recent EKG, chest xray, echo, or card cath.  States that she had a stress test in 2013 at [facility] with Dr Norman Herrlich.  Fax sent for info.

## 2013-05-30 NOTE — Progress Notes (Signed)
Anesthesia Chart Review:  Patient is a 72 year old female scheduled for C4-5 ACDF pm 06/08/13 by Dr. Franky Macho.  History includes smoking, HTN, CAD s/p angioplasty and later stent in the early to mid '90's, hypercholesterolemia, COPD, anxiety, GERD, RLS, diverticulitis, gout, DDD, esophageal dysmotility, pulmonary nodule (stabe by CT since 2012; followed by pulmonologist Dr. Craige Cotta).  PCP is Dr. Gaye Alken at [facility].  Cardiologist is documented as Dr. Bing Matter at [facility]) in [place], but last visit was with Dr. Norman Herrlich on 11/17/11 for preoperative clearance prior to spigelian hernia repair. It appears she was instructed to follow-up there PRN. She remains on ARB, B-blocker, and statin therapy.  EKG on 05/29/13 showed NSR, septal infarct (age undetermined), ST/T wave abnormality, consider anterolateral ischemia.  Inferior T wave abnormality has improved but anterolateral T wave abnormality is more prominent when compared to her prior EKG on 11/17/11.  She had a normal stress echo on 11/17/11 [facility]).    CXR on 05/29/13 showed: Stable right lower lobe nodule. Continued followup as per prior CT recommendations is recommended. No acute abnormality noted. (Chest CT on 04/06/13 at [facility] [place] [facility] showed stable right basilar nodule.)  PFT 01/07/12: FEV1 1.55 (92%), FEV1% 60, TLC 4.89 (122%), DLCO 66%, no BD.  Preoperative labs noted.  Patient has had functional cardiac testing within the past two years.  No CV symptoms documented at her PAT visit.  If there are no acute changes or new CV symptomology then it is anticipated that she can proceed as plan.  Anesthesiologist Dr. Katrinka Blazing agrees with this plan.  Velna Ochs [facility]/Anesthesiology Phone [phone] 05/31/2013 3:11 PM

## 2013-06-07 MED ORDER — CEFAZOLIN SODIUM-DEXTROSE 2-3 GM-% IV SOLR
2.0000 g | INTRAVENOUS | Status: AC
  Administered 2013-06-08: 2 g via INTRAVENOUS
  Filled 2013-06-07: qty 50

## 2013-06-08 ENCOUNTER — Encounter (HOSPITAL_COMMUNITY): Payer: Self-pay | Admitting: Vascular Surgery

## 2013-06-08 ENCOUNTER — Encounter (HOSPITAL_COMMUNITY)
Admission: RE | Disposition: A | Discharge status: Home / Self Care | Payer: Self-pay | Source: Ambulatory Visit | Attending: Neurosurgery

## 2013-06-08 ENCOUNTER — Inpatient Hospital Stay (HOSPITAL_COMMUNITY): Payer: Medicare Other | Admitting: Anesthesiology

## 2013-06-08 ENCOUNTER — Inpatient Hospital Stay (HOSPITAL_COMMUNITY): Payer: Medicare Other

## 2013-06-08 ENCOUNTER — Ambulatory Visit (HOSPITAL_COMMUNITY)
Admission: RE | Admit: 2013-06-08 | Discharge: 2013-06-09 | Disposition: A | Discharge status: Home / Self Care | Payer: Medicare Other | Source: Ambulatory Visit | Attending: Neurosurgery | Admitting: Neurosurgery

## 2013-06-08 ENCOUNTER — Encounter (HOSPITAL_COMMUNITY): Payer: Self-pay | Admitting: *Deleted

## 2013-06-08 DIAGNOSIS — J449 Chronic obstructive pulmonary disease, unspecified: Secondary | ICD-10-CM | POA: Insufficient documentation

## 2013-06-08 DIAGNOSIS — J4489 Other specified chronic obstructive pulmonary disease: Secondary | ICD-10-CM | POA: Insufficient documentation

## 2013-06-08 DIAGNOSIS — Z79899 Other long term (current) drug therapy: Secondary | ICD-10-CM | POA: Insufficient documentation

## 2013-06-08 DIAGNOSIS — I10 Essential (primary) hypertension: Secondary | ICD-10-CM | POA: Insufficient documentation

## 2013-06-08 DIAGNOSIS — M47812 Spondylosis without myelopathy or radiculopathy, cervical region: Principal | ICD-10-CM | POA: Insufficient documentation

## 2013-06-08 HISTORY — PX: ANTERIOR CERVICAL DECOMP/DISCECTOMY FUSION: SHX1161

## 2013-06-08 SURGERY — ANTERIOR CERVICAL DECOMPRESSION/DISCECTOMY FUSION 1 LEVEL
Anesthesia: General | Site: Neck | Wound class: Clean

## 2013-06-08 MED ORDER — SENNOSIDES-DOCUSATE SODIUM 8.6-50 MG PO TABS
1.0000 | ORAL_TABLET | Freq: Every evening | ORAL | Status: DC | PRN
  Filled 2013-06-08: qty 1

## 2013-06-08 MED ORDER — MENTHOL 3 MG MT LOZG
1.0000 | LOZENGE | OROMUCOSAL | Status: DC | PRN
  Administered 2013-06-08: 3 mg via ORAL
  Filled 2013-06-08: qty 9

## 2013-06-08 MED ORDER — TIOTROPIUM BROMIDE MONOHYDRATE 18 MCG IN CAPS
18.0000 ug | ORAL_CAPSULE | Freq: Every day | RESPIRATORY_TRACT | Status: DC
  Filled 2013-06-08: qty 5

## 2013-06-08 MED ORDER — PHENYLEPHRINE HCL 10 MG/ML IJ SOLN
10.0000 mg | INTRAVENOUS | Status: DC | PRN
  Administered 2013-06-08: 40 ug/min via INTRAVENOUS

## 2013-06-08 MED ORDER — ACETAMINOPHEN 325 MG PO TABS: 650.0000 mg | ORAL_TABLET | ORAL | Status: DC | PRN

## 2013-06-08 MED ORDER — OXYCODONE-ACETAMINOPHEN 5-325 MG PO TABS
ORAL_TABLET | ORAL | Status: AC
  Filled 2013-06-08: qty 2

## 2013-06-08 MED ORDER — ONDANSETRON HCL 4 MG/2ML IJ SOLN
4.0000 mg | INTRAMUSCULAR | Status: DC | PRN
  Administered 2013-06-08 (×2): 4 mg via INTRAVENOUS
  Filled 2013-06-08 (×2): qty 2

## 2013-06-08 MED ORDER — HYDROMORPHONE HCL PF 1 MG/ML IJ SOLN
0.5000 mg | INTRAMUSCULAR | Status: DC | PRN
  Administered 2013-06-08 (×2): 1 mg via INTRAVENOUS
  Filled 2013-06-08 (×2): qty 1

## 2013-06-08 MED ORDER — ACETAMINOPHEN 650 MG RE SUPP: 650.0000 mg | RECTAL | Status: DC | PRN

## 2013-06-08 MED ORDER — PHENOL 1.4 % MT LIQD
1.0000 | OROMUCOSAL | Status: DC | PRN
  Administered 2013-06-08: 1 via OROMUCOSAL
  Filled 2013-06-08: qty 177

## 2013-06-08 MED ORDER — FENTANYL CITRATE 0.05 MG/ML IJ SOLN
INTRAMUSCULAR | Status: DC | PRN
  Administered 2013-06-08 (×2): 50 ug via INTRAVENOUS
  Administered 2013-06-08 (×2): 75 ug via INTRAVENOUS

## 2013-06-08 MED ORDER — HYDROCODONE-ACETAMINOPHEN 5-325 MG PO TABS: 1.0000 | ORAL_TABLET | ORAL | Status: DC | PRN

## 2013-06-08 MED ORDER — OXYCODONE-ACETAMINOPHEN 5-325 MG PO TABS
1.0000 | ORAL_TABLET | ORAL | Status: DC | PRN
  Administered 2013-06-08 – 2013-06-09 (×5): 2 via ORAL
  Filled 2013-06-08 (×4): qty 2

## 2013-06-08 MED ORDER — ROCURONIUM BROMIDE 100 MG/10ML IV SOLN
INTRAVENOUS | Status: DC | PRN
  Administered 2013-06-08: 20 mg via INTRAVENOUS
  Administered 2013-06-08: 30 mg via INTRAVENOUS

## 2013-06-08 MED ORDER — GLYCOPYRROLATE 0.2 MG/ML IJ SOLN
INTRAMUSCULAR | Status: DC | PRN
  Administered 2013-06-08: 0.2 mg via INTRAVENOUS

## 2013-06-08 MED ORDER — LACTATED RINGERS IV SOLN
INTRAVENOUS | Status: DC | PRN
  Administered 2013-06-08 (×2): via INTRAVENOUS

## 2013-06-08 MED ORDER — SENNA 8.6 MG PO TABS
1.0000 | ORAL_TABLET | Freq: Two times a day (BID) | ORAL | Status: DC
  Filled 2013-06-08 (×3): qty 1

## 2013-06-08 MED ORDER — 0.9 % SODIUM CHLORIDE (POUR BTL) OPTIME
TOPICAL | Status: DC | PRN
  Administered 2013-06-08: 1000 mL

## 2013-06-08 MED ORDER — METOPROLOL SUCCINATE ER 100 MG PO TB24
100.0000 mg | ORAL_TABLET | Freq: Two times a day (BID) | ORAL | Status: DC
  Filled 2013-06-08 (×3): qty 1

## 2013-06-08 MED ORDER — POTASSIUM CHLORIDE IN NACL 20-0.9 MEQ/L-% IV SOLN
INTRAVENOUS | Status: DC
  Administered 2013-06-08: 22:00:00 via INTRAVENOUS
  Filled 2013-06-08 (×3): qty 1000

## 2013-06-08 MED ORDER — LIDOCAINE HCL (CARDIAC) 20 MG/ML IV SOLN
INTRAVENOUS | Status: DC | PRN
  Administered 2013-06-08: 20 mg via INTRAVENOUS

## 2013-06-08 MED ORDER — HYDROCHLOROTHIAZIDE 12.5 MG PO CAPS
12.5000 mg | ORAL_CAPSULE | Freq: Every day | ORAL | Status: DC
  Filled 2013-06-08: qty 1

## 2013-06-08 MED ORDER — THROMBIN 5000 UNITS EX SOLR
CUTANEOUS | Status: DC | PRN
  Administered 2013-06-08 (×2): 5000 [IU] via TOPICAL

## 2013-06-08 MED ORDER — ONDANSETRON HCL 4 MG/2ML IJ SOLN: 4.0000 mg | Freq: Once | INTRAMUSCULAR | Status: DC | PRN

## 2013-06-08 MED ORDER — PROPOFOL 10 MG/ML IV BOLUS
INTRAVENOUS | Status: DC | PRN
  Administered 2013-06-08: 160 mg via INTRAVENOUS
  Administered 2013-06-08: 40 mg via INTRAVENOUS

## 2013-06-08 MED ORDER — ATORVASTATIN CALCIUM 40 MG PO TABS
40.0000 mg | ORAL_TABLET | Freq: Every day | ORAL | Status: DC
  Administered 2013-06-08: 40 mg via ORAL
  Filled 2013-06-08 (×2): qty 1

## 2013-06-08 MED ORDER — SODIUM CHLORIDE 0.9 % IJ SOLN
3.0000 mL | Freq: Two times a day (BID) | INTRAMUSCULAR | Status: DC
  Administered 2013-06-08 (×2): 3 mL via INTRAVENOUS

## 2013-06-08 MED ORDER — LIDOCAINE-EPINEPHRINE 0.5 %-1:200000 IJ SOLN
INTRAMUSCULAR | Status: DC | PRN
  Administered 2013-06-08: 30 mL

## 2013-06-08 MED ORDER — HYDROMORPHONE HCL PF 1 MG/ML IJ SOLN: 0.2500 mg | INTRAMUSCULAR | Status: DC | PRN

## 2013-06-08 MED ORDER — LOSARTAN POTASSIUM-HCTZ 100-12.5 MG PO TABS: 1.0000 | ORAL_TABLET | Freq: Every day | ORAL | Status: DC

## 2013-06-08 MED ORDER — DIAZEPAM 5 MG PO TABS
5.0000 mg | ORAL_TABLET | Freq: Four times a day (QID) | ORAL | Status: DC | PRN
  Administered 2013-06-08 – 2013-06-09 (×2): 5 mg via ORAL
  Filled 2013-06-08 (×2): qty 1

## 2013-06-08 MED ORDER — ZOLPIDEM TARTRATE 5 MG PO TABS: 5.0000 mg | ORAL_TABLET | Freq: Every evening | ORAL | Status: DC | PRN

## 2013-06-08 MED ORDER — SODIUM CHLORIDE 0.9 % IJ SOLN: 3.0000 mL | INTRAMUSCULAR | Status: DC | PRN

## 2013-06-08 MED ORDER — PANTOPRAZOLE SODIUM 40 MG PO TBEC: 80.0000 mg | DELAYED_RELEASE_TABLET | Freq: Every day | ORAL | Status: DC

## 2013-06-08 MED ORDER — MIDAZOLAM HCL 5 MG/5ML IJ SOLN
INTRAMUSCULAR | Status: DC | PRN
  Administered 2013-06-08: 2 mg via INTRAVENOUS

## 2013-06-08 MED ORDER — PAROXETINE HCL 20 MG PO TABS
20.0000 mg | ORAL_TABLET | Freq: Every day | ORAL | Status: DC
  Filled 2013-06-08: qty 1

## 2013-06-08 MED ORDER — LOSARTAN POTASSIUM 50 MG PO TABS
100.0000 mg | ORAL_TABLET | Freq: Every day | ORAL | Status: DC
  Filled 2013-06-08: qty 2

## 2013-06-08 MED ORDER — HEMOSTATIC AGENTS (NO CHARGE) OPTIME
TOPICAL | Status: DC | PRN
  Administered 2013-06-08: 1 via TOPICAL

## 2013-06-08 SURGICAL SUPPLY — 74 items
ADH SKN CLS APL DERMABOND .7 (GAUZE/BANDAGES/DRESSINGS) ×1
ADH SKN CLS LQ APL DERMABOND (GAUZE/BANDAGES/DRESSINGS) ×1
BANDAGE GAUZE ELAST BULKY 4 IN (GAUZE/BANDAGES/DRESSINGS) ×2 IMPLANT
BIT DRILL NEURO 2X3.1 SFT TUCH (MISCELLANEOUS) ×1 IMPLANT
BLADE SURG ROTATE 9660 (MISCELLANEOUS) IMPLANT
BUR DRUM 4.0 (BURR) ×1 IMPLANT
CANISTER SUCTION 2500CC (MISCELLANEOUS) ×2 IMPLANT
CLOTH BEACON ORANGE TIMEOUT ST (SAFETY) ×2 IMPLANT
CONT SPEC 4OZ CLIKSEAL STRL BL (MISCELLANEOUS) ×2 IMPLANT
DECANTER SPIKE VIAL GLASS SM (MISCELLANEOUS) ×2 IMPLANT
DERMABOND ADHESIVE PROPEN (GAUZE/BANDAGES/DRESSINGS) ×1
DERMABOND ADVANCED (GAUZE/BANDAGES/DRESSINGS) ×1
DERMABOND ADVANCED .7 DNX12 (GAUZE/BANDAGES/DRESSINGS) ×1 IMPLANT
DERMABOND ADVANCED .7 DNX6 (GAUZE/BANDAGES/DRESSINGS) IMPLANT
DRAPE LAPAROTOMY 100X72 PEDS (DRAPES) ×2 IMPLANT
DRAPE MICROSCOPE LEICA (MISCELLANEOUS) ×2 IMPLANT
DRAPE POUCH INSTRU U-SHP 10X18 (DRAPES) ×2 IMPLANT
DRAPE PROXIMA HALF (DRAPES) ×2 IMPLANT
DRILL NEURO 2X3.1 SOFT TOUCH (MISCELLANEOUS) ×2
DURAPREP 6ML APPLICATOR 50/CS (WOUND CARE) ×2 IMPLANT
ELECT COATED BLADE 2.86 ST (ELECTRODE) ×2 IMPLANT
ELECT REM PT RETURN 9FT ADLT (ELECTROSURGICAL) ×2
ELECTRODE REM PT RTRN 9FT ADLT (ELECTROSURGICAL) ×1 IMPLANT
GAUZE SPONGE 4X4 16PLY XRAY LF (GAUZE/BANDAGES/DRESSINGS) IMPLANT
GLOVE BIO SURGEON STRL SZ 6.5 (GLOVE) IMPLANT
GLOVE BIO SURGEON STRL SZ7 (GLOVE) IMPLANT
GLOVE BIO SURGEON STRL SZ7.5 (GLOVE) IMPLANT
GLOVE BIO SURGEON STRL SZ8 (GLOVE) IMPLANT
GLOVE BIO SURGEON STRL SZ8.5 (GLOVE) IMPLANT
GLOVE BIOGEL M 8.0 STRL (GLOVE) ×1 IMPLANT
GLOVE ECLIPSE 6.5 STRL STRAW (GLOVE) ×2 IMPLANT
GLOVE ECLIPSE 7.0 STRL STRAW (GLOVE) IMPLANT
GLOVE ECLIPSE 7.5 STRL STRAW (GLOVE) IMPLANT
GLOVE ECLIPSE 8.0 STRL XLNG CF (GLOVE) IMPLANT
GLOVE ECLIPSE 8.5 STRL (GLOVE) IMPLANT
GLOVE EXAM NITRILE LRG STRL (GLOVE) IMPLANT
GLOVE EXAM NITRILE MD LF STRL (GLOVE) IMPLANT
GLOVE EXAM NITRILE XL STR (GLOVE) IMPLANT
GLOVE EXAM NITRILE XS STR PU (GLOVE) IMPLANT
GLOVE INDICATOR 6.5 STRL GRN (GLOVE) IMPLANT
GLOVE INDICATOR 7.0 STRL GRN (GLOVE) IMPLANT
GLOVE INDICATOR 7.5 STRL GRN (GLOVE) IMPLANT
GLOVE INDICATOR 8.0 STRL GRN (GLOVE) IMPLANT
GLOVE INDICATOR 8.5 STRL (GLOVE) ×2 IMPLANT
GLOVE OPTIFIT SS 8.0 STRL (GLOVE) IMPLANT
GLOVE SURG SS PI 6.5 STRL IVOR (GLOVE) IMPLANT
GLOVE SURG SS PI 8.0 STRL IVOR (GLOVE) ×3 IMPLANT
GOWN BRE IMP SLV AUR LG STRL (GOWN DISPOSABLE) ×4 IMPLANT
GOWN BRE IMP SLV AUR XL STRL (GOWN DISPOSABLE) IMPLANT
GOWN STRL REIN 2XL LVL4 (GOWN DISPOSABLE) ×2 IMPLANT
KIT BASIN OR (CUSTOM PROCEDURE TRAY) ×2 IMPLANT
KIT ROOM TURNOVER OR (KITS) ×2 IMPLANT
NDL HYPO 25X1 1.5 SAFETY (NEEDLE) ×1 IMPLANT
NDL SPNL 22GX3.5 QUINCKE BK (NEEDLE) ×1 IMPLANT
NEEDLE HYPO 25X1 1.5 SAFETY (NEEDLE) ×2 IMPLANT
NEEDLE SPNL 22GX3.5 QUINCKE BK (NEEDLE) ×2 IMPLANT
NS IRRIG 1000ML POUR BTL (IV SOLUTION) ×2 IMPLANT
PACK LAMINECTOMY NEURO (CUSTOM PROCEDURE TRAY) ×2 IMPLANT
PAD ARMBOARD 7.5X6 YLW CONV (MISCELLANEOUS) ×6 IMPLANT
PIN DISTRACTION 14MM (PIN) ×4 IMPLANT
PLATE HELIX R 22MM (Plate) ×1 IMPLANT
RUBBERBAND STERILE (MISCELLANEOUS) ×4 IMPLANT
SCREW 4.0X13 (Screw) IMPLANT
SCREW 4.0X13MM (Screw) ×4 IMPLANT
SPACER ACF PARALLEL 7MM (Bone Implant) ×1 IMPLANT
SPONGE INTESTINAL PEANUT (DISPOSABLE) ×2 IMPLANT
SPONGE SURGIFOAM ABS GEL SZ50 (HEMOSTASIS) ×2 IMPLANT
SUT VIC AB 0 CT1 27 (SUTURE) ×2
SUT VIC AB 0 CT1 27XBRD ANTBC (SUTURE) ×1 IMPLANT
SUT VIC AB 3-0 SH 8-18 (SUTURE) ×2 IMPLANT
SYR 20ML ECCENTRIC (SYRINGE) ×2 IMPLANT
TOWEL OR 17X24 6PK STRL BLUE (TOWEL DISPOSABLE) ×2 IMPLANT
TOWEL OR 17X26 10 PK STRL BLUE (TOWEL DISPOSABLE) ×2 IMPLANT
WATER STERILE IRR 1000ML POUR (IV SOLUTION) ×2 IMPLANT

## 2013-06-08 NOTE — Anesthesia Preprocedure Evaluation (Addendum)
Anesthesia Evaluation  Patient identified by MRN, date of birth, ID band Patient awake    Reviewed: Allergy & Precautions, H&P , NPO status , Patient's Chart, lab work & pertinent test results, reviewed documented beta blocker date and time   Airway Mallampati: III TM Distance: >3 FB Neck ROM: Full    Dental  (+) Edentulous Upper, Edentulous Lower and Dental Advisory Given   Pulmonary shortness of breath, COPD         Cardiovascular hypertension, Pt. on medications and Pt. on home beta blockers + angina + CAD and + Cardiac Stents     Neuro/Psych Anxiety  Neuromuscular disease    GI/Hepatic GERD-  Medicated and Controlled,  Endo/Other    Renal/GU      Musculoskeletal   Abdominal   Peds  Hematology   Anesthesia Other Findings   Reproductive/Obstetrics                          Anesthesia Physical Anesthesia Plan  ASA: III  Anesthesia Plan: General   Post-op Pain Management:    Induction: Intravenous  Airway Management Planned: Oral ETT  Additional Equipment:   Intra-op Plan:   Post-operative Plan: Extubation in OR  Informed Consent: I have reviewed the patients History and Physical, chart, labs and discussed the procedure including the risks, benefits and alternatives for the proposed anesthesia with the patient or authorized representative who has indicated his/her understanding and acceptance.     Plan Discussed with: CRNA, Anesthesiologist and Surgeon  Anesthesia Plan Comments:         Anesthesia Quick Evaluation

## 2013-06-08 NOTE — Preoperative (Signed)
Beta Blockers   Reason not to administer Beta Blockers:took metoprolol at 0530, 06/08/2013

## 2013-06-08 NOTE — Transfer of Care (Signed)
Immediate Anesthesia Transfer of Care Note  Patient: Melinda Schroeder  Procedure(s) Performed: Procedure(s) with comments: Cervical Four Five anterior cervical decompression with fusion plating and bonegraft (N/A) - ANTERIOR CERVICAL DECOMPRESSION/DISCECTOMY FUSION 1 LEVEL  Patient Location: PACU  Anesthesia Type:General  Level of Consciousness: sedated  Airway & Oxygen Therapy: Patient Spontanous Breathing and Patient connected to nasal cannula oxygen  Post-op Assessment: Report given to PACU RN and Post -op Vital signs reviewed and stable  Post vital signs: Reviewed and stable  Complications: No apparent anesthesia complications

## 2013-06-08 NOTE — Plan of Care (Signed)
Problem: Consults Goal: Diagnosis - Spinal Surgery Outcome: Completed/Met Date Met:  06/08/13 Cervical Spine Fusion     

## 2013-06-08 NOTE — Op Note (Signed)
06/08/2013  10:11 AM  PATIENT:  Melinda Schroeder  72 y.o. female with right upper extremity pain due to foraminal compromise at C4/5 on the right side coupled with facet arthropathy in the right C4/5 facet/   PRE-OPERATIVE DIAGNOSIS:  cervical spondylosis cervical radiculopathy  POST-OPERATIVE DIAGNOSIS:  cervical spondylosis cervical radiculopathy  PROCEDURE:  Anterior Cervical decompression C4/5 Arthrodesis C4/5 with 7mm structural allograft Anterior instrumentation(Helix plate, nuvasive) C4/5  SURGEON:  Surgeon(s): Carmela Hurt, MD Karn Cassis, MD  ASSISTANTS:Botero, Lynne Logan  ANESTHESIA:   general  EBL:  Total I/O In: 1100 [I.V.:1100] Out: 50 [Blood:50]  BLOOD ADMINISTERED:none  CELL SAVER GIVEN:none  COUNT:per nursing  DRAINS: none   SPECIMEN:  No Specimen  DICTATION: [name] was taken to the operating room, intubated, and placed under general anesthesia without difficulty. She was positioned supine with her head in slight extension on a horseshoe headrest. The neck was prepped and draped in a sterile manner. I infiltrated 3 cc's 1/2%lidocaine/1:200,000 strength epinephrine into the planned incision starting from the midline to the medial border of the left sternocleidomastoid muscle. I opened the incision with a 10 blade and dissected sharply through soft tissue to the platysma. I dissected in the plane superior to the platysma both rostrally and caudally. I then opened the platysma in a horizontal fashion with Metzenbaum scissors, and dissected in the inferior plane rostrally and caudally. With both blunt and sharp technique I created an avascular corridor to the cervical spine. I placed a spinal needle(s) in the disc space at C4/5 . I then reflected the longus colli from C4 to C5 and placed self retaining retractors. I opened the disc space(s) at c4/5 with a 15 blade. I removed disc with curettes, Kerrison punches, and the drill. Using the drill I removed osteophytes  and prepared for the decompression.  I decompressed the spinal canal and the C5 root(s) with the drill, Kerrison punches, and the curettes. I used the microscope to aid in microdissection. I removed the posterior longitudinal ligament to fully expose and decompress the thecal sac. I exposed the roots laterally taking down the C4/5 uncovertebral joints. With the decompression complete we moved on to the arthrodesis. We used the drill to level the surfaces of C4 and 5. I removed soft tissue to prepare the disc space and the bony surfaces. I measured the space and placed a 7mm structural allograft into the disc space.  We then placed the anterior instrumentation. I placed 2 screws in each vertebral body through the plate. I locked the screws into place. Intraoperative xray showed the graft, plate, and screws to be in good position. I irrigated the wound, achieved hemostasis, and closed the wound in layers. I approximated the platysma, and the subcuticular plane with vicryl sutures. I used Dermabond for a sterile dressing.   PLAN OF CARE: Admit for overnight observation  PATIENT DISPOSITION:  PACU - hemodynamically stable.   Delay start of Pharmacological VTE agent (>24hrs) due to surgical blood loss or risk of bleeding:  yes

## 2013-06-08 NOTE — Anesthesia Procedure Notes (Signed)
Procedure Name: Intubation Date/Time: 06/08/2013 8:14 AM Performed by: Alanda Amass A Pre-anesthesia Checklist: Patient identified, Timeout performed, Emergency Drugs available, Suction available and Patient being monitored Patient Re-evaluated:Patient Re-evaluated prior to inductionOxygen Delivery Method: Circle system utilized Preoxygenation: Pre-oxygenation with 100% oxygen Intubation Type: IV induction Ventilation: Mask ventilation without difficulty and Oral airway inserted - appropriate to patient size Laryngoscope Size: Mac and 3 Grade View: Grade I Tube type: Oral Tube size: 7.5 mm Number of attempts: 1 Airway Equipment and Method: Stylet Placement Confirmation: ETT inserted through vocal cords under direct vision,  breath sounds checked- equal and bilateral and positive ETCO2 Secured at: 20 cm Tube secured with: Tape Dental Injury: Teeth and Oropharynx as per pre-operative assessment

## 2013-06-08 NOTE — H&P (Signed)
BP 170/61  Pulse 66  Temp(Src) 98.6 F (37 C)  Resp 16  SpO2 99% OUTPATIENT OFFICE NOTE   HOPI:                                                   Melinda Schroeder presents today with a month-and-a-half long history of pain in the right side of the neck radiating from the posterior shoulder blade into the shoulder.  Only on the right and nothing on the left side.  Some mild weakness in the right shoulder.  She has no pain below the elbow.  She does not have any memory of antecedent trauma, this is just something that happened.   CURRENT MEDICATIONS:                     Hydrocodone, losartan, Paxil, Prilosec, simvastatin, and Toprol.   REVIEW OF SYSTEMS:                        Positive for back pain and neck pain.  She denies allergic, cardiovascular, constitutional, endocrine, ear, nose, throat, mouth, eye, gastrointestinal, genitourinary, hematologic, skin, neurologic, psychiatric, and respiratory problems.  She has no reproductive problems either.   PHYSICAL EXAMINATION:                    On exam, she is alert and oriented x4 and answering all questions appropriately.  Blood pressure today is 154/71 and pulse is 66.   IMPRESSION/PLAN:                             We went over in great detail the operation.  When we look at the MRI, she has foraminal narrowing at C4-5 on the right side.  It does correspond with her problem.  She does not have canal stenosis of any level.  She does not have a herniated disc.  She does have facet arthropathy and asymmetry with the C4-5 facet on the right being much larger than the one on the left.  Cord signal is normal.  I gave her a detailed instruction sheet that goes over the operation and I explained the procedure to she and her husband here in the office today.  She understands the risks and benefits, bleeding, infection, damage to the recurrent laryngeal nerve, damage to the spinal cord, bowel or bladder dysfunction, no relief of pain, fusion failure, and hardware  failure.  She would like to proceed sometime after 06/06/2013.  We will try to get this scheduled on 06/08/2013 for single-level ACDF at C4-5.

## 2013-06-08 NOTE — Anesthesia Postprocedure Evaluation (Signed)
  Anesthesia Post-op Note  Patient: Melinda Schroeder  Procedure(s) Performed: Procedure(s) with comments: Cervical Four Five anterior cervical decompression with fusion plating and bonegraft (N/A) - ANTERIOR CERVICAL DECOMPRESSION/DISCECTOMY FUSION 1 LEVEL  Patient Location: PACU  Anesthesia Type:General  Level of Consciousness: awake, oriented, sedated and patient cooperative  Airway and Oxygen Therapy: Patient Spontanous Breathing  Post-op Pain: mild  Post-op Assessment: Post-op Vital signs reviewed, Patient's Cardiovascular Status Stable, Respiratory Function Stable, Patent Airway, No signs of Nausea or vomiting and Pain level controlled  Post-op Vital Signs: stable  Complications: No apparent anesthesia complications

## 2013-06-09 MED ORDER — ONDANSETRON HCL 4 MG PO TABS: 4.0000 mg | ORAL_TABLET | Freq: Three times a day (TID) | ORAL | Status: DC | PRN

## 2013-06-09 MED ORDER — OXYCODONE-ACETAMINOPHEN 5-325 MG PO TABS: 1.0000 | ORAL_TABLET | ORAL | Status: DC | PRN

## 2013-06-09 NOTE — Discharge Summary (Signed)
Physician Discharge Summary  Patient ID: Melinda Schroeder MRN: [id number] DOB/AGE: 71-21-1942 71 y.o.  Admit date: 06/08/2013 Discharge date: 06/09/2013  Admission Diagnoses: Cervical spondylosis with radiculopathy C4-C5 right  Discharge Diagnoses: Cervical spondylosis with radiculopathy C4-C5 right Active Problems:   * No active hospital problems. *   Discharged Condition: good  Hospital Course: She was admitted to undergo surgical decompression C4-C5 which he tolerated well. Her incision is clean dry she has significant ecchymoses underneath the skin.  Consults: None  Significant Diagnostic Studies: None  Treatments: surgery: Anterior cervical decompression C4-C5 arthrodesis with structural allograft anterior plate fixation [id number]  Discharge Exam: Blood pressure 110/50, pulse 63, temperature 98.1 F (36.7 C), temperature source Oral, resp. rate 18, SpO2 94.00%. Motor function is intact in upper extremities incision is clean and dry with significant ecchymoses as the patient has very fragile skin.  Disposition: Discharge home   Discharge Orders   Future Orders Complete By Expires   Call MD for:  redness, tenderness, or signs of infection (pain, swelling, redness, odor or green/yellow discharge around incision site)  As directed    Call MD for:  severe uncontrolled pain  As directed    Call MD for:  temperature >100.4  As directed    Diet - low sodium heart healthy  As directed    Increase activity slowly  As directed        Medication List         HYDROcodone-acetaminophen 7.5-325 MG per tablet  Commonly known as:  NORCO  Take 1 tablet by mouth every 8 (eight) hours as needed for pain. As needed for back pain     losartan-hydrochlorothiazide 100-12.5 MG per tablet  Commonly known as:  HYZAAR  Take 1 tablet by mouth daily.     metoprolol succinate 100 MG 24 hr tablet  Commonly known as:  TOPROL-XL  Take 100 mg by mouth 2 (two) times daily. Take with or immediately  following a meal.     omeprazole 40 MG capsule  Commonly known as:  PRILOSEC  Take 40 mg by mouth daily.     ondansetron 4 MG tablet  Commonly known as:  ZOFRAN  Take 1 tablet (4 mg total) by mouth every 8 (eight) hours as needed for nausea.     oxyCODONE-acetaminophen 5-325 MG per tablet  Commonly known as:  PERCOCET/ROXICET  Take 1 tablet by mouth every 4 (four) hours as needed for pain.     PARoxetine 20 MG tablet  Commonly known as:  PAXIL  Take 20 mg by mouth daily.     simvastatin 80 MG tablet  Commonly known as:  ZOCOR  Take 80 mg by mouth at bedtime.     tiotropium 18 MCG inhalation capsule  Commonly known as:  SPIRIVA  Place 18 mcg into inhaler and inhale daily.         SignedStefani Dama 06/09/2013, 9:37 AM

## 2013-06-09 NOTE — Progress Notes (Signed)
Pt. discharged home accompanied by husband. Prescriptions and discharge instructions given with verbalization of understanding. Incision site on neck with no s/s of infection - no swelling, redness, bleeding, and/or drainage noted. Soft collar intact. Pain med given just before leaving. Opportunity given to ask questions but no question asked. Pt. transported out of this unit in wheelchair by the volunteer. 

## 2013-06-12 ENCOUNTER — Encounter (HOSPITAL_COMMUNITY): Payer: Self-pay | Admitting: Neurosurgery

## 2013-09-04 ENCOUNTER — Telehealth: Payer: Self-pay | Admitting: Pulmonary Disease

## 2013-09-04 NOTE — Telephone Encounter (Signed)
I spoke with Tammy. She reports pt had PNA vaccine 2013. She is wanting to get prevnar. She is aware Dr. Craige Cotta is out of the office until 09/17/12. Please advise thanks

## 2013-09-05 NOTE — Telephone Encounter (Signed)
Spoke with the pt and notified okay to come here for prevnar  She will call back to schedule appt to come in for inj schedule I offered to make this appt but she wanted to call back  Nothing further needed

## 2013-09-05 NOTE — Telephone Encounter (Signed)
Okay for her to get Prevnar vaccination.

## 2014-09-03 ENCOUNTER — Telehealth: Payer: Self-pay

## 2014-09-03 ENCOUNTER — Other Ambulatory Visit: Payer: Self-pay

## 2014-09-03 DIAGNOSIS — K3189 Other diseases of stomach and duodenum: Secondary | ICD-10-CM

## 2014-09-03 NOTE — Telephone Encounter (Signed)
EUS scheduled, pt instructed and medications reviewed.  Patient instructions mailed to home.  Patient to call with any questions or concerns.  

## 2014-09-03 NOTE — Telephone Encounter (Signed)
Left message on machine to call back regarding the EUS for 10/17/14 830 am

## 2014-10-08 ENCOUNTER — Encounter (HOSPITAL_COMMUNITY): Payer: Self-pay | Admitting: *Deleted

## 2014-10-14 DIAGNOSIS — M79604 Pain in right leg: Secondary | ICD-10-CM | POA: Diagnosis not present

## 2014-10-14 DIAGNOSIS — E785 Hyperlipidemia, unspecified: Secondary | ICD-10-CM | POA: Diagnosis not present

## 2014-10-14 DIAGNOSIS — Z6822 Body mass index (BMI) 22.0-22.9, adult: Secondary | ICD-10-CM | POA: Diagnosis not present

## 2014-10-14 DIAGNOSIS — I10 Essential (primary) hypertension: Secondary | ICD-10-CM | POA: Diagnosis not present

## 2014-10-14 DIAGNOSIS — D72829 Elevated white blood cell count, unspecified: Secondary | ICD-10-CM | POA: Diagnosis not present

## 2014-10-14 DIAGNOSIS — J449 Chronic obstructive pulmonary disease, unspecified: Secondary | ICD-10-CM | POA: Diagnosis not present

## 2014-10-14 DIAGNOSIS — Z720 Tobacco use: Secondary | ICD-10-CM | POA: Diagnosis not present

## 2014-10-14 DIAGNOSIS — M1990 Unspecified osteoarthritis, unspecified site: Secondary | ICD-10-CM | POA: Diagnosis not present

## 2014-10-17 ENCOUNTER — Ambulatory Visit (HOSPITAL_COMMUNITY): Payer: Commercial Managed Care - HMO | Admitting: Certified Registered"

## 2014-10-17 ENCOUNTER — Encounter (HOSPITAL_COMMUNITY): Payer: Self-pay

## 2014-10-17 ENCOUNTER — Encounter (HOSPITAL_COMMUNITY)
Admission: RE | Disposition: A | Discharge status: Home / Self Care | Payer: Self-pay | Source: Ambulatory Visit | Attending: Gastroenterology

## 2014-10-17 ENCOUNTER — Ambulatory Visit (HOSPITAL_COMMUNITY)
Admission: RE | Admit: 2014-10-17 | Discharge: 2014-10-17 | Disposition: A | Discharge status: Home / Self Care | Payer: Commercial Managed Care - HMO | Source: Ambulatory Visit | Attending: Gastroenterology | Admitting: Gastroenterology

## 2014-10-17 DIAGNOSIS — Z79899 Other long term (current) drug therapy: Secondary | ICD-10-CM | POA: Diagnosis not present

## 2014-10-17 DIAGNOSIS — Z8719 Personal history of other diseases of the digestive system: Secondary | ICD-10-CM | POA: Diagnosis not present

## 2014-10-17 DIAGNOSIS — Z955 Presence of coronary angioplasty implant and graft: Secondary | ICD-10-CM | POA: Diagnosis not present

## 2014-10-17 DIAGNOSIS — K219 Gastro-esophageal reflux disease without esophagitis: Secondary | ICD-10-CM | POA: Insufficient documentation

## 2014-10-17 DIAGNOSIS — F17210 Nicotine dependence, cigarettes, uncomplicated: Secondary | ICD-10-CM | POA: Diagnosis not present

## 2014-10-17 DIAGNOSIS — M1990 Unspecified osteoarthritis, unspecified site: Secondary | ICD-10-CM | POA: Diagnosis not present

## 2014-10-17 DIAGNOSIS — G2581 Restless legs syndrome: Secondary | ICD-10-CM | POA: Diagnosis not present

## 2014-10-17 DIAGNOSIS — J449 Chronic obstructive pulmonary disease, unspecified: Secondary | ICD-10-CM | POA: Diagnosis not present

## 2014-10-17 DIAGNOSIS — I2510 Atherosclerotic heart disease of native coronary artery without angina pectoris: Secondary | ICD-10-CM | POA: Insufficient documentation

## 2014-10-17 DIAGNOSIS — K3189 Other diseases of stomach and duodenum: Secondary | ICD-10-CM | POA: Insufficient documentation

## 2014-10-17 DIAGNOSIS — K319 Disease of stomach and duodenum, unspecified: Secondary | ICD-10-CM

## 2014-10-17 HISTORY — PX: EUS: SHX5427

## 2014-10-17 SURGERY — UPPER ENDOSCOPIC ULTRASOUND (EUS) LINEAR
Anesthesia: Monitor Anesthesia Care

## 2014-10-17 MED ORDER — ONDANSETRON HCL 4 MG/2ML IJ SOLN
INTRAMUSCULAR | Status: DC | PRN
  Administered 2014-10-17: 4 mg via INTRAVENOUS

## 2014-10-17 MED ORDER — PROPOFOL 10 MG/ML IV BOLUS
INTRAVENOUS | Status: AC
  Filled 2014-10-17: qty 20

## 2014-10-17 MED ORDER — LIDOCAINE HCL (PF) 2 % IJ SOLN
INTRAMUSCULAR | Status: DC | PRN
  Administered 2014-10-17: 20 mg via INTRADERMAL

## 2014-10-17 MED ORDER — LACTATED RINGERS IV SOLN
INTRAVENOUS | Status: DC
  Administered 2014-10-17: 1000 mL via INTRAVENOUS

## 2014-10-17 MED ORDER — PROPOFOL INFUSION 10 MG/ML OPTIME
INTRAVENOUS | Status: DC | PRN
  Administered 2014-10-17: 120 ug/kg/min via INTRAVENOUS

## 2014-10-17 MED ORDER — ONDANSETRON HCL 4 MG/2ML IJ SOLN
INTRAMUSCULAR | Status: AC
  Filled 2014-10-17: qty 2

## 2014-10-17 MED ORDER — PROPOFOL 10 MG/ML IV BOLUS
INTRAVENOUS | Status: DC | PRN
  Administered 2014-10-17: 20 mg via INTRAVENOUS
  Administered 2014-10-17: 30 mg via INTRAVENOUS
  Administered 2014-10-17: 50 mg via INTRAVENOUS

## 2014-10-17 MED ORDER — SODIUM CHLORIDE 0.9 % IV SOLN: INTRAVENOUS | Status: DC

## 2014-10-17 NOTE — Op Note (Signed)
[place], [id number]   ENDOSCOPIC ULTRASOUND PROCEDURE REPORT  PATIENT: Melinda Schroeder, Melinda Schroeder  MR#: [id number] BIRTHDATE: 09-Jun-1941  GENDER: female ENDOSCOPIST: Milus Banister, MD REFERRED BY:  Kyra Leyland, M.D. PROCEDURE DATE:  10/17/2014 PROCEDURE:   Upper EUS w/FNA ASA CLASS:      Class III INDICATIONS:   1.  submucosal lesion noted in stomach on recent EGD (Dr.  Lyda Jester). MEDICATIONS: Monitored anesthesia care  DESCRIPTION OF PROCEDURE:   After the risks benefits and alternatives of the procedure were  explained, informed consent was obtained. The patient was then placed in the left, lateral, decubitus postion and IV sedation was administered. Throughout the procedure, the patients blood pressure, pulse and oxygen saturations were monitored continuously.  Under direct visualization, the Pentax EUS Radial [id number]  endoscope was introduced through the mouth  and advanced to the second portion of the duodenum .  Water was used as necessary to provide an acoustic interface.  Upon completion of the imaging, water was removed and the patient was sent to the recovery room in satisfactory condition.   Endoscopic findings: 1. Lobular submucosal bulge along lesser curve of stomach, incisure. This measured 1-2cm across endoscopically. 2. UGI was otherwise normal.  EUS findings 1. The bulge above correlated with a hypoechoec, homogeneous solid mass that clearly involved the muscularis propria layer of the gastric wall. Measured 1.4cm maximally. This mass was sampled with 2 passes of a 25 gauge EUS FNA needle. 2. No perigastric adenopathy. 3. CBD was normal, non-dilated 4. Limited views of the pancreas, spleen, liver, portal vessels were all normal. ENDOSCOPIC IMPRESSION: 1.4cm submucosal mass along lesser curve of the stomach, sampled with EUS FNA. 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 At this size (<2cm) I do  not usually recommend surgical resection for GIST.  It is very unlikely that this lesion accounts for her 30-40 pound weight loss over the past year.  RECOMMENDATIONS: Await final pathology.  _______________________________ eSignedMilus Banister, MD 10/17/2014 9:51 AM

## 2014-10-17 NOTE — Discharge Instructions (Signed)

## 2014-10-17 NOTE — Anesthesia Preprocedure Evaluation (Addendum)
Anesthesia Evaluation  Patient identified by MRN, date of birth, ID band Patient awake    Reviewed: Allergy & Precautions, NPO status , Patient's Chart, lab work & pertinent test results, reviewed documented beta blocker date and time   Airway Mallampati: III  TM Distance: <3 FB Neck ROM: Limited    Dental  (+) Upper Dentures, Lower Dentures   Pulmonary COPDCurrent Smoker,  breath sounds clear to auscultation        Cardiovascular hypertension, Pt. on medications and Pt. on home beta blockers + angina + CAD and + Cardiac Stents Rhythm:Regular Rate:Normal     Neuro/Psych Anxiety  Neuromuscular disease    GI/Hepatic Neg liver ROS, GERD-  ,  Endo/Other  negative endocrine ROS  Renal/GU negative Renal ROS     Musculoskeletal  (+) Arthritis -,   Abdominal   Peds  Hematology negative hematology ROS (+)   Anesthesia Other Findings   Reproductive/Obstetrics                            Anesthesia Physical Anesthesia Plan  ASA: III  Anesthesia Plan: MAC   Post-op Pain Management:    Induction: Intravenous  Airway Management Planned: Nasal Cannula and Natural Airway  Additional Equipment:   Intra-op Plan:   Post-operative Plan:   Informed Consent: I have reviewed the patients History and Physical, chart, labs and discussed the procedure including the risks, benefits and alternatives for the proposed anesthesia with the patient or authorized representative who has indicated his/her understanding and acceptance.     Plan Discussed with: CRNA  Anesthesia Plan Comments:         Anesthesia Quick Evaluation

## 2014-10-17 NOTE — H&P (Signed)
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 Diverticulitis   . Gastritis   . Hypercholesterolemia   . Hypertension   . Anxiety   . COPD (chronic obstructive pulmonary disease)   . DDD (degenerative disc disease)   . IBS (irritable bowel syndrome)   . Esophageal dysmotilities   . Coronary artery disease     stent placed 15 years ago Dr Lia Foyer  . Anginal pain     years ago  . GERD (gastroesophageal reflux disease)   . Bronchitis, allergic   . Restless legs     Past Surgical History  Procedure Laterality Date  . Bladder surgery    . Breast mass right excision    . Hernia repair    . Coronary stent placement    . Appendectomy    . Shoulder sugery Left     rtc repair  . Anterior cervical decomp/discectomy fusion N/A 06/08/2013    Procedure: Cervical Four Five anterior cervical decompression with fusion plating and bonegraft;  Surgeon: Winfield Cunas, MD;  Location: [facility];  Service: Neurosurgery;  Laterality: N/A;  ANTERIOR CERVICAL DECOMPRESSION/DISCECTOMY FUSION 1 LEVEL  . Abdominal hysterectomy      complete    Current Facility-Administered Medications  Medication Dose Route Frequency Provider Last Rate Last Dose  . 0.9 %  sodium chloride infusion   Intravenous Continuous Milus Banister, MD      . lactated ringers infusion   Intravenous Continuous Milus Banister, MD 50 mL/hr at 10/17/14 0815 1,000 mL at 10/17/14 0815   Facility-Administered Medications Ordered in Other Encounters  Medication Dose Route Frequency Provider Last Rate Last Dose  . lidocaine (XYLOCAINE) 2 % injection    Anesthesia Intra-op Verlin Grills, CRNA   20 mg at 10/17/14 0853  . ondansetron (ZOFRAN) injection   Intravenous Anesthesia Intra-op Lollie Sails, CRNA   4 mg at 10/17/14 0840  . propofol (DIPRIVAN) 10 mg/mL bolus/IV push    Anesthesia Intra-op Verlin Grills, CRNA   50 mg at 10/17/14 0853  . propofol (DIPRIVAN)  infusion 10 mg/ml EMUL    Continuous PRN Verlin Grills, CRNA 36.2 mL/hr at 10/17/14 0853 120 mcg/kg/min at 10/17/14 0853    Allergies as of 09/03/2014 - Review Complete 06/08/2013  Allergen Reaction Noted  . Ace inhibitors  12/21/2011  . Sulfa antibiotics  12/21/2011    Family History  Problem Relation Age of Onset  . Emphysema Father   . Stomach cancer Brother   . Stroke Mother     History   Social History  . Marital Status: Married    Spouse Name: N/A    Number of Children: 1  . Years of Education: N/A   Occupational History  . hair stylist    Social History Main Topics  . Smoking status: Current Every Day Smoker -- 1.00 packs/day for 50 years    Types: Cigarettes  . Smokeless tobacco: Not on file  . Alcohol Use: No  . Drug Use: No  . Sexual Activity: Not on file   Other Topics Concern  . Not on file   Social History Narrative      Physical Exam: BP 119/60 mmHg  Pulse 64  Temp(Src) 98.1 F (36.7 C) (Oral)  Resp 15  Ht 5' (1.524 m)  Wt 111 lb (50.349 kg)  BMI 21.68 kg/m2  SpO2 99% Constitutional: generally well-appearing Psychiatric: alert and  oriented x3 Abdomen: soft, nontender, nondistended, no obvious ascites, no peritoneal signs, normal bowel sounds     Assessment and plan: 74 y.o. female with submucosal mass in stomach  For EUS today

## 2014-10-17 NOTE — Transfer of Care (Signed)
Immediate Anesthesia Transfer of Care Note  Patient: Melinda Schroeder  Procedure(s) Performed: Procedure(s) (LRB): UPPER ENDOSCOPIC ULTRASOUND (EUS) LINEAR (N/A)  Patient Location: PACU  Anesthesia Type: MAC  Level of Consciousness: sedated, patient cooperative and responds to stimulation  Airway & Oxygen Therapy: Patient Spontanous Breathing and Patient connected to face mask oxgen  Post-op Assessment: Report given to PACU RN and Post -op Vital signs reviewed and stable  Post vital signs: Reviewed and stable  Complications: No apparent anesthesia complications

## 2014-10-17 NOTE — Anesthesia Postprocedure Evaluation (Signed)
  Anesthesia Post-op Note  Patient: Melinda Schroeder  Procedure(s) Performed: Procedure(s): UPPER ENDOSCOPIC ULTRASOUND (EUS) LINEAR (N/A)  Patient Location: PACU  Anesthesia Type:MAC  Level of Consciousness: awake and alert   Airway and Oxygen Therapy: Patient Spontanous Breathing  Post-op Pain: none  Post-op Assessment: Post-op Vital signs reviewed  Post-op Vital Signs: Reviewed  Last Vitals:  Filed Vitals:   10/17/14 0941  BP: 90/33  Pulse: 62  Temp: 37 C  Resp: 19    Complications: No apparent anesthesia complications

## 2014-10-18 ENCOUNTER — Encounter (HOSPITAL_COMMUNITY): Payer: Self-pay | Admitting: Gastroenterology

## 2014-11-18 DIAGNOSIS — M25551 Pain in right hip: Secondary | ICD-10-CM | POA: Diagnosis not present

## 2014-11-18 DIAGNOSIS — Z6821 Body mass index (BMI) 21.0-21.9, adult: Secondary | ICD-10-CM | POA: Diagnosis not present

## 2014-11-18 DIAGNOSIS — M79604 Pain in right leg: Secondary | ICD-10-CM | POA: Diagnosis not present

## 2014-11-18 DIAGNOSIS — M79651 Pain in right thigh: Secondary | ICD-10-CM | POA: Diagnosis not present

## 2014-12-02 DIAGNOSIS — Z1231 Encounter for screening mammogram for malignant neoplasm of breast: Secondary | ICD-10-CM | POA: Diagnosis not present

## 2014-12-03 DIAGNOSIS — M79604 Pain in right leg: Secondary | ICD-10-CM | POA: Diagnosis not present

## 2015-01-14 DIAGNOSIS — Z9181 History of falling: Secondary | ICD-10-CM | POA: Diagnosis not present

## 2015-01-14 DIAGNOSIS — E785 Hyperlipidemia, unspecified: Secondary | ICD-10-CM | POA: Diagnosis not present

## 2015-01-14 DIAGNOSIS — Z1389 Encounter for screening for other disorder: Secondary | ICD-10-CM | POA: Diagnosis not present

## 2015-01-14 DIAGNOSIS — J449 Chronic obstructive pulmonary disease, unspecified: Secondary | ICD-10-CM | POA: Diagnosis not present

## 2015-01-14 DIAGNOSIS — Z6821 Body mass index (BMI) 21.0-21.9, adult: Secondary | ICD-10-CM | POA: Diagnosis not present

## 2015-01-14 DIAGNOSIS — I10 Essential (primary) hypertension: Secondary | ICD-10-CM | POA: Diagnosis not present

## 2015-01-14 DIAGNOSIS — M1990 Unspecified osteoarthritis, unspecified site: Secondary | ICD-10-CM | POA: Diagnosis not present

## 2015-04-21 DIAGNOSIS — M542 Cervicalgia: Secondary | ICD-10-CM | POA: Diagnosis not present

## 2015-04-21 DIAGNOSIS — Z6821 Body mass index (BMI) 21.0-21.9, adult: Secondary | ICD-10-CM | POA: Diagnosis not present

## 2015-05-21 DIAGNOSIS — M1990 Unspecified osteoarthritis, unspecified site: Secondary | ICD-10-CM | POA: Diagnosis not present

## 2015-05-21 DIAGNOSIS — E785 Hyperlipidemia, unspecified: Secondary | ICD-10-CM | POA: Diagnosis not present

## 2015-05-21 DIAGNOSIS — Z9181 History of falling: Secondary | ICD-10-CM | POA: Diagnosis not present

## 2015-05-21 DIAGNOSIS — J449 Chronic obstructive pulmonary disease, unspecified: Secondary | ICD-10-CM | POA: Diagnosis not present

## 2015-05-21 DIAGNOSIS — Z1389 Encounter for screening for other disorder: Secondary | ICD-10-CM | POA: Diagnosis not present

## 2015-05-21 DIAGNOSIS — Z6821 Body mass index (BMI) 21.0-21.9, adult: Secondary | ICD-10-CM | POA: Diagnosis not present

## 2015-05-21 DIAGNOSIS — Z139 Encounter for screening, unspecified: Secondary | ICD-10-CM | POA: Diagnosis not present

## 2015-05-21 DIAGNOSIS — I10 Essential (primary) hypertension: Secondary | ICD-10-CM | POA: Diagnosis not present

## 2015-06-02 DIAGNOSIS — Z23 Encounter for immunization: Secondary | ICD-10-CM | POA: Diagnosis not present

## 2015-06-23 DIAGNOSIS — M5412 Radiculopathy, cervical region: Secondary | ICD-10-CM | POA: Diagnosis not present

## 2015-06-23 DIAGNOSIS — Z6821 Body mass index (BMI) 21.0-21.9, adult: Secondary | ICD-10-CM | POA: Diagnosis not present

## 2015-07-01 DIAGNOSIS — M5412 Radiculopathy, cervical region: Secondary | ICD-10-CM | POA: Diagnosis not present

## 2015-07-01 DIAGNOSIS — M50321 Other cervical disc degeneration at C4-C5 level: Secondary | ICD-10-CM | POA: Diagnosis not present

## 2015-07-18 DIAGNOSIS — J440 Chronic obstructive pulmonary disease with acute lower respiratory infection: Secondary | ICD-10-CM | POA: Diagnosis not present

## 2015-07-21 DIAGNOSIS — D492 Neoplasm of unspecified behavior of bone, soft tissue, and skin: Secondary | ICD-10-CM | POA: Diagnosis not present

## 2015-07-21 DIAGNOSIS — M47892 Other spondylosis, cervical region: Secondary | ICD-10-CM | POA: Diagnosis not present

## 2015-08-05 DIAGNOSIS — M47812 Spondylosis without myelopathy or radiculopathy, cervical region: Secondary | ICD-10-CM | POA: Diagnosis not present

## 2015-08-05 DIAGNOSIS — M47892 Other spondylosis, cervical region: Secondary | ICD-10-CM | POA: Diagnosis not present

## 2015-08-05 DIAGNOSIS — I10 Essential (primary) hypertension: Secondary | ICD-10-CM | POA: Diagnosis not present

## 2015-09-02 DIAGNOSIS — M47812 Spondylosis without myelopathy or radiculopathy, cervical region: Secondary | ICD-10-CM | POA: Diagnosis not present

## 2015-09-11 DIAGNOSIS — J180 Bronchopneumonia, unspecified organism: Secondary | ICD-10-CM | POA: Diagnosis not present

## 2015-09-12 DIAGNOSIS — J180 Bronchopneumonia, unspecified organism: Secondary | ICD-10-CM | POA: Diagnosis not present

## 2015-10-01 DIAGNOSIS — D72829 Elevated white blood cell count, unspecified: Secondary | ICD-10-CM | POA: Diagnosis not present

## 2015-10-01 DIAGNOSIS — I10 Essential (primary) hypertension: Secondary | ICD-10-CM | POA: Diagnosis not present

## 2015-10-01 DIAGNOSIS — Z6821 Body mass index (BMI) 21.0-21.9, adult: Secondary | ICD-10-CM | POA: Diagnosis not present

## 2015-10-01 DIAGNOSIS — M5412 Radiculopathy, cervical region: Secondary | ICD-10-CM | POA: Diagnosis not present

## 2015-10-01 DIAGNOSIS — J449 Chronic obstructive pulmonary disease, unspecified: Secondary | ICD-10-CM | POA: Diagnosis not present

## 2015-10-01 DIAGNOSIS — E785 Hyperlipidemia, unspecified: Secondary | ICD-10-CM | POA: Diagnosis not present

## 2015-10-07 DIAGNOSIS — M47812 Spondylosis without myelopathy or radiculopathy, cervical region: Secondary | ICD-10-CM | POA: Diagnosis not present

## 2015-10-14 DIAGNOSIS — M47892 Other spondylosis, cervical region: Secondary | ICD-10-CM | POA: Diagnosis not present

## 2015-10-14 DIAGNOSIS — D492 Neoplasm of unspecified behavior of bone, soft tissue, and skin: Secondary | ICD-10-CM | POA: Diagnosis not present

## 2015-10-14 DIAGNOSIS — I10 Essential (primary) hypertension: Secondary | ICD-10-CM | POA: Diagnosis not present

## 2015-10-17 ENCOUNTER — Other Ambulatory Visit: Payer: Self-pay | Admitting: Neurosurgery

## 2015-10-20 DIAGNOSIS — D492 Neoplasm of unspecified behavior of bone, soft tissue, and skin: Secondary | ICD-10-CM | POA: Diagnosis not present

## 2015-10-20 DIAGNOSIS — M5020 Other cervical disc displacement, unspecified cervical region: Secondary | ICD-10-CM | POA: Diagnosis not present

## 2015-10-22 ENCOUNTER — Inpatient Hospital Stay (HOSPITAL_COMMUNITY)
Admission: RE | Admit: 2015-10-22 | Discharge: 2015-10-22 | Disposition: A | Discharge status: Home / Self Care | Payer: Commercial Managed Care - HMO | Source: Ambulatory Visit

## 2015-10-24 ENCOUNTER — Inpatient Hospital Stay (HOSPITAL_COMMUNITY)
Admission: RE | Admit: 2015-10-24 | Payer: Commercial Managed Care - HMO | Source: Ambulatory Visit | Admitting: Neurosurgery

## 2015-10-24 ENCOUNTER — Encounter (HOSPITAL_COMMUNITY): Admission: RE | Payer: Self-pay | Source: Ambulatory Visit

## 2015-10-24 SURGERY — POSTERIOR CERVICAL FUSION/FORAMINOTOMY LEVEL 2
Anesthesia: General

## 2015-11-12 ENCOUNTER — Telehealth: Payer: Self-pay

## 2015-11-12 NOTE — Telephone Encounter (Signed)
-----   Message from Milus Banister, MD sent at 11/11/2015  7:29 PM EST ----- Regarding: RE: Was it ok that I scheduled this pt w/[facility]? They just need another EUS. There was a Recall for 10-2015. Pleae let me know Barbie Haggis [phone] She can be directly scheduled for upper EUS.  Thanks   ----- Message -----    From: Barron Alvine, RN    Sent: 11/11/2015   4:02 PM      To: Milus Banister, MD Subject: FW: Was it ok that I scheduled this pt w/[facility]  Dr Ardis Hughs can I go ahead and schedule EUS directly and cancel the appt with Cecille Rubin?  ----- Message -----    From: Oliva Bustard    Sent: 11/11/2015   3:40 PM      To: Barron Alvine, RN Subject: Was it ok that I scheduled this pt w/[facility]? T#

## 2015-11-18 NOTE — Telephone Encounter (Signed)
Pt has been advised that the appt with Cecille Rubin has been cancelled and EUS will be set up for 12/04/15.  She will be called tomorrow with the appt info

## 2015-11-19 ENCOUNTER — Other Ambulatory Visit: Payer: Self-pay

## 2015-11-19 DIAGNOSIS — K319 Disease of stomach and duodenum, unspecified: Secondary | ICD-10-CM

## 2015-11-20 NOTE — Telephone Encounter (Signed)
EUS scheduled, pt instructed and medications reviewed.  Patient instructions mailed to home.  Patient to call with any questions or concerns.  

## 2015-11-25 ENCOUNTER — Ambulatory Visit: Payer: Commercial Managed Care - HMO | Admitting: Physician Assistant

## 2015-11-27 DIAGNOSIS — J440 Chronic obstructive pulmonary disease with acute lower respiratory infection: Secondary | ICD-10-CM | POA: Diagnosis not present

## 2015-11-27 DIAGNOSIS — Z6822 Body mass index (BMI) 22.0-22.9, adult: Secondary | ICD-10-CM | POA: Diagnosis not present

## 2015-11-28 ENCOUNTER — Encounter (HOSPITAL_COMMUNITY): Payer: Self-pay | Admitting: *Deleted

## 2015-11-29 DIAGNOSIS — E871 Hypo-osmolality and hyponatremia: Secondary | ICD-10-CM | POA: Diagnosis not present

## 2015-11-29 DIAGNOSIS — R112 Nausea with vomiting, unspecified: Secondary | ICD-10-CM | POA: Diagnosis not present

## 2015-11-29 DIAGNOSIS — E860 Dehydration: Secondary | ICD-10-CM | POA: Diagnosis not present

## 2015-11-29 DIAGNOSIS — R197 Diarrhea, unspecified: Secondary | ICD-10-CM | POA: Diagnosis not present

## 2015-11-29 DIAGNOSIS — E876 Hypokalemia: Secondary | ICD-10-CM | POA: Diagnosis not present

## 2015-11-29 DIAGNOSIS — R0602 Shortness of breath: Secondary | ICD-10-CM | POA: Diagnosis not present

## 2015-11-29 NOTE — Anesthesia Preprocedure Evaluation (Addendum)
Anesthesia Evaluation  Patient identified by MRN, date of birth, ID band Patient awake    Reviewed: Allergy & Precautions, NPO status , Patient's Chart, lab work & pertinent test results, reviewed documented beta blocker date and time   Airway Mallampati: III  TM Distance: <3 FB Neck ROM: Limited    Dental  (+) Upper Dentures, Lower Dentures   Pulmonary neg pulmonary ROS, COPD, Current Smoker (50 pack year),    breath sounds clear to auscultation       Cardiovascular hypertension, Pt. on medications and Pt. on home beta blockers + angina + CAD  Cardiac stents: 15 years ago.  negative cardio ROS   Rhythm:Regular Rate:Normal     Neuro/Psych Anxiety  Neuromuscular disease negative neurological ROS     GI/Hepatic negative GI ROS, Neg liver ROS, GERD  Medicated,Submucosal gastric mass   Endo/Other  negative endocrine ROS  Renal/GU negative Renal ROS  negative genitourinary   Musculoskeletal negative musculoskeletal ROS (+) Arthritis ,   Abdominal   Peds negative pediatric ROS (+)  Hematology negative hematology ROS (+)   Anesthesia Other Findings   Reproductive/Obstetrics negative OB ROS                            Anesthesia Physical Anesthesia Plan  ASA: III  Anesthesia Plan: MAC   Post-op Pain Management:    Induction:   Airway Management Planned: Nasal Cannula  Additional Equipment:   Intra-op Plan:   Post-operative Plan:   Informed Consent: I have reviewed the patients History and Physical, chart, labs and discussed the procedure including the risks, benefits and alternatives for the proposed anesthesia with the patient or authorized representative who has indicated his/her understanding and acceptance.     Plan Discussed with:   Anesthesia Plan Comments:         Anesthesia Quick Evaluation

## 2015-11-30 DIAGNOSIS — E876 Hypokalemia: Secondary | ICD-10-CM | POA: Diagnosis not present

## 2015-11-30 DIAGNOSIS — K319 Disease of stomach and duodenum, unspecified: Secondary | ICD-10-CM | POA: Diagnosis not present

## 2015-11-30 DIAGNOSIS — I10 Essential (primary) hypertension: Secondary | ICD-10-CM | POA: Diagnosis not present

## 2015-11-30 DIAGNOSIS — E860 Dehydration: Secondary | ICD-10-CM | POA: Diagnosis not present

## 2015-11-30 DIAGNOSIS — R112 Nausea with vomiting, unspecified: Secondary | ICD-10-CM | POA: Diagnosis not present

## 2015-11-30 DIAGNOSIS — E871 Hypo-osmolality and hyponatremia: Secondary | ICD-10-CM | POA: Diagnosis not present

## 2015-11-30 DIAGNOSIS — E7800 Pure hypercholesterolemia, unspecified: Secondary | ICD-10-CM | POA: Diagnosis not present

## 2015-11-30 DIAGNOSIS — R0602 Shortness of breath: Secondary | ICD-10-CM | POA: Diagnosis not present

## 2015-11-30 DIAGNOSIS — R05 Cough: Secondary | ICD-10-CM | POA: Diagnosis not present

## 2015-11-30 DIAGNOSIS — M542 Cervicalgia: Secondary | ICD-10-CM | POA: Diagnosis not present

## 2015-11-30 DIAGNOSIS — R197 Diarrhea, unspecified: Secondary | ICD-10-CM | POA: Diagnosis not present

## 2015-12-04 ENCOUNTER — Ambulatory Visit (HOSPITAL_COMMUNITY)
Admission: RE | Admit: 2015-12-04 | Discharge: 2015-12-04 | Disposition: A | Discharge status: Home / Self Care | Payer: Commercial Managed Care - HMO | Source: Ambulatory Visit | Attending: Gastroenterology | Admitting: Gastroenterology

## 2015-12-04 ENCOUNTER — Encounter (HOSPITAL_COMMUNITY)
Admission: RE | Disposition: A | Discharge status: Home / Self Care | Payer: Self-pay | Source: Ambulatory Visit | Attending: Gastroenterology

## 2015-12-04 ENCOUNTER — Ambulatory Visit (HOSPITAL_COMMUNITY): Payer: Commercial Managed Care - HMO | Admitting: Anesthesiology

## 2015-12-04 ENCOUNTER — Encounter (HOSPITAL_COMMUNITY): Payer: Self-pay | Admitting: Gastroenterology

## 2015-12-04 DIAGNOSIS — K219 Gastro-esophageal reflux disease without esophagitis: Secondary | ICD-10-CM | POA: Insufficient documentation

## 2015-12-04 DIAGNOSIS — J449 Chronic obstructive pulmonary disease, unspecified: Secondary | ICD-10-CM | POA: Diagnosis not present

## 2015-12-04 DIAGNOSIS — Z955 Presence of coronary angioplasty implant and graft: Secondary | ICD-10-CM | POA: Insufficient documentation

## 2015-12-04 DIAGNOSIS — K3189 Other diseases of stomach and duodenum: Secondary | ICD-10-CM | POA: Diagnosis not present

## 2015-12-04 DIAGNOSIS — I2510 Atherosclerotic heart disease of native coronary artery without angina pectoris: Secondary | ICD-10-CM | POA: Diagnosis not present

## 2015-12-04 DIAGNOSIS — F17210 Nicotine dependence, cigarettes, uncomplicated: Secondary | ICD-10-CM | POA: Diagnosis not present

## 2015-12-04 DIAGNOSIS — M1990 Unspecified osteoarthritis, unspecified site: Secondary | ICD-10-CM | POA: Insufficient documentation

## 2015-12-04 DIAGNOSIS — I209 Angina pectoris, unspecified: Secondary | ICD-10-CM | POA: Diagnosis not present

## 2015-12-04 DIAGNOSIS — K319 Disease of stomach and duodenum, unspecified: Secondary | ICD-10-CM

## 2015-12-04 DIAGNOSIS — I10 Essential (primary) hypertension: Secondary | ICD-10-CM | POA: Insufficient documentation

## 2015-12-04 HISTORY — PX: EUS: SHX5427

## 2015-12-04 SURGERY — UPPER ENDOSCOPIC ULTRASOUND (EUS) RADIAL
Anesthesia: Monitor Anesthesia Care

## 2015-12-04 MED ORDER — PROPOFOL 10 MG/ML IV BOLUS
INTRAVENOUS | Status: AC
  Filled 2015-12-04: qty 40

## 2015-12-04 MED ORDER — ONDANSETRON HCL 4 MG/2ML IJ SOLN
INTRAMUSCULAR | Status: AC
  Filled 2015-12-04: qty 2

## 2015-12-04 MED ORDER — PROPOFOL 10 MG/ML IV BOLUS
INTRAVENOUS | Status: AC
  Filled 2015-12-04: qty 20

## 2015-12-04 MED ORDER — MEPERIDINE HCL 100 MG/ML IJ SOLN: 6.2500 mg | INTRAMUSCULAR | Status: DC | PRN

## 2015-12-04 MED ORDER — PROPOFOL 500 MG/50ML IV EMUL
INTRAVENOUS | Status: DC | PRN
  Administered 2015-12-04 (×3): 30 mg via INTRAVENOUS

## 2015-12-04 MED ORDER — LACTATED RINGERS IV SOLN
INTRAVENOUS | Status: DC
Start: 2015-12-04 — End: 2015-12-04
  Administered 2015-12-04: 1000 mL via INTRAVENOUS
  Administered 2015-12-04: 10:00:00 via INTRAVENOUS

## 2015-12-04 MED ORDER — PROMETHAZINE HCL 25 MG/ML IJ SOLN: 6.2500 mg | INTRAMUSCULAR | Status: DC | PRN

## 2015-12-04 MED ORDER — PROPOFOL 500 MG/50ML IV EMUL
INTRAVENOUS | Status: DC | PRN
Start: 2015-12-04 — End: 2015-12-04
  Administered 2015-12-04: 100 ug/kg/min via INTRAVENOUS

## 2015-12-04 MED ORDER — ONDANSETRON HCL 4 MG/2ML IJ SOLN
4.0000 mg | Freq: Once | INTRAMUSCULAR | Status: AC
  Administered 2015-12-04: 4 mg via INTRAVENOUS

## 2015-12-04 MED ORDER — SODIUM CHLORIDE 0.9 % IV SOLN: INTRAVENOUS | Status: DC

## 2015-12-04 NOTE — Transfer of Care (Signed)
Immediate Anesthesia Transfer of Care Note  Patient: Melinda Schroeder  Procedure(s) Performed: Procedure(s): UPPER ENDOSCOPIC ULTRASOUND (EUS) RADIAL (N/A)  Patient Location: PACU  Anesthesia Type:MAC  Level of Consciousness: sedated, patient cooperative and responds to stimulation  Airway & Oxygen Therapy: Patient Spontanous Breathing and Patient connected to nasal cannula oxygen  Post-op Assessment: Report given to RN and Post -op Vital signs reviewed and stable  Post vital signs: Reviewed and stable  Last Vitals:  Filed Vitals:   12/04/15 0844 12/04/15 1001  BP: 146/67 106/42  Pulse: 71 64  Temp: 37.7 C   Resp: 18 18    Complications: No apparent anesthesia complications

## 2015-12-04 NOTE — Discharge Instructions (Signed)

## 2015-12-04 NOTE — H&P (Signed)
  HPI: This is a woman with small gastric lesion  Chief complaint is small gastric lesion   Past Medical History  Diagnosis Date  . Diverticulitis   . Gastritis   . Hypercholesterolemia   . Hypertension   . Anxiety   . COPD (chronic obstructive pulmonary disease) ([place])   . DDD (degenerative disc disease)   . IBS (irritable bowel syndrome)   . Esophageal dysmotilities   . Anginal pain ([place])     years ago  . GERD (gastroesophageal reflux disease)   . Bronchitis, allergic     11-27-15 MD visit -2 injections given , oral Levaquin at present.  [name] Restless legs   . Coronary artery disease     stent placed 15 years ago Dr Lia Foyer. Dr. Steward Ros seen 2 yrs ago- no recent problems or follow up cardiology visits.    Past Surgical History  Procedure Laterality Date  . Bladder surgery    . Breast mass right excision    . Hernia repair    . Coronary stent placement    . Appendectomy    . Shoulder sugery Left     rtc repair  . Anterior cervical decomp/discectomy fusion N/A 06/08/2013    Procedure: Cervical Four Five anterior cervical decompression with fusion plating and bonegraft;  Surgeon: Winfield Cunas, MD;  Location: [facility];  Service: Neurosurgery;  Laterality: N/A;  ANTERIOR CERVICAL DECOMPRESSION/DISCECTOMY FUSION 1 LEVEL  . Abdominal hysterectomy      complete  . Eus N/A 10/17/2014    Procedure: UPPER ENDOSCOPIC ULTRASOUND (EUS) LINEAR;  Surgeon: Milus Banister, MD;  Location: [facility];  Service: Endoscopy;  Laterality: N/A;    No current facility-administered medications for this encounter.    Allergies as of 11/19/2015 - Review Complete 10/21/2015  Allergen Reaction Noted  . Ace inhibitors  12/21/2011  . Sulfa antibiotics  12/21/2011    Family History  Problem Relation Age of Onset  . Emphysema Father   . Stomach cancer Brother   . Stroke Mother     Social History   Social History  . Marital Status: Married    Spouse Name: N/A  . Number of Children: 1   . Years of Education: N/A   Occupational History  . hair stylist    Social History Main Topics  . Smoking status: Current Every Day Smoker -- 1.00 packs/day for 50 years    Types: Cigarettes  . Smokeless tobacco: Not on file  . Alcohol Use: No  . Drug Use: No  . Sexual Activity: Not on file   Other Topics Concern  . Not on file   Social History Narrative     Physical Exam: BP 146/67 mmHg  Pulse 71  Resp 18  Ht 5' (1.524 m)  Wt 105 lb (47.628 kg)  BMI 20.51 kg/m2  SpO2 91% Constitutional: generally well-appearing Psychiatric: alert and oriented x3 Abdomen: soft, nontender, nondistended, no obvious ascites, no peritoneal signs, normal bowel sounds   Assessment and plan: 75 y.o. 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  Here for surveillance EUS, check for interval significant changes.   Owens Loffler, MD [facility] 12/04/2015, 8:53 AM

## 2015-12-04 NOTE — Anesthesia Postprocedure Evaluation (Signed)
Anesthesia Post Note  Patient: Melinda Schroeder  Procedure(s) Performed: Procedure(s) (LRB): UPPER ENDOSCOPIC ULTRASOUND (EUS) RADIAL (N/A)  Patient location during evaluation: Endoscopy Anesthesia Type: MAC Level of consciousness: awake and alert Pain management: pain level controlled Vital Signs Assessment: post-procedure vital signs reviewed and stable Respiratory status: spontaneous breathing, nonlabored ventilation, respiratory function stable and patient connected to nasal cannula oxygen Cardiovascular status: blood pressure returned to baseline and stable Postop Assessment: no signs of nausea or vomiting Anesthetic complications: no    Last Vitals:  Filed Vitals:   12/04/15 1001 12/04/15 1010  BP: 106/42 132/55  Pulse: 64 65  Temp: 36.9 C   Resp: 18 22    Last Pain: There were no vitals filed for this visit.               Alexis Frock

## 2015-12-04 NOTE — Op Note (Signed)
[facility] Patient Name: Melinda Schroeder Procedure Date: 12/04/2015 MRN: [id number] Attending MD: Milus Banister , MD Date of Birth: 01-08-1941 CSN:  Age: 75 Admit Type: Outpatient Procedure:                Upper EUS Indications:              Gastric mucosal mass/polyp found on endoscopy;                            gastric submucosal lesion noted 2016 Dr.                            Jari Sportsman EGD; EUS 2016 confirmed size 1.4cm, FNA                            suggested spindle cells; was recommended to have                            surveillance EUS in 12 months. Providers:                Milus Banister, MD, Malka So, RN, Cletis Athens, Technician Referring MD:             Sandria Senter, MD Medicines:                Monitored Anesthesia Care Complications:            No immediate complications. Estimated Blood Loss:     Estimated blood loss: none. Procedure:                Pre-Anesthesia Assessment:                           - Prior to the procedure, a History and Physical                            was performed, and patient medications and                            allergies were reviewed. The patient's tolerance of                            previous anesthesia was also reviewed. The risks                            and benefits of the procedure and the sedation                            options and risks were discussed with the patient.                            All questions were answered, and informed consent  was obtained. Prior Anticoagulants: The patient has                            taken no previous anticoagulant or antiplatelet                            agents. ASA Grade Assessment: III - A patient with                            severe systemic disease. After reviewing the risks                            and benefits, the patient was deemed in                            satisfactory  condition to undergo the procedure.                           After obtaining informed consent, the endoscope was                            passed under direct vision. Throughout the                            procedure, the patient's blood pressure, pulse, and                            oxygen saturations were monitored continuously. The                            [id number]) scope was introduced through                            the mouth, and advanced to the second part of                            duodenum. The upper EUS was accomplished without                            difficulty. The patient tolerated the procedure                            well. Scope In: Scope Out: Findings:      Endosonographic Finding :      Endoscopic findings:      1. Normal esophagus, normal duodenum      2. 1-2cm submucosal lesion in distal stomach, along greater curvature,       appears unchanged from evaluation one year ago      EUS findings:      1. The gastric submucosal lesion described above correlated with a 1.6cm       hypoechoic, homogeneous mass that communicated with the muscularis       propria layer of the gastric wall.      2. No perigastric adenopathy      3. Limited views of  liver, pancreas, spleen, portal and splenic vessels       were all normal Impression:               Endoscopic findings:                           1. Normal esophagus, normal duodenum                           2. 1-2cm submucosal lesion in distal stomach, along                            greater curvature, appears unchanged from                            evaluation one year ago                           EUS findings:                           1. The gastric submucosal lesion described above                            correlated with a 1.6cm hypoechoic, homogeneous                            mass that communicated with the muscularis propria                            layer of the gastric wall.                            2. No perigastric adenopathy                           3. Limited views of liver, pancreas, spleen, portal                            and splenic vessels were all normal Moderate Sedation:      N/A- Per Anesthesia Care Recommendation:           Endoscopic findings:                           1. Normal esophagus, normal duodenum                           2. 1-2cm submucosal lesion in distal stomach, along                            greater curvature, appears unchanged from                            evaluation one year ago                           EUS findings:  1. The gastric submucosal lesion described above                            correlated with a 1.6cm hypoechoic, homogeneous                            mass that communicated with the muscularis propria                            layer of the gastric wall.                           2. No perigastric adenopathy                           3. Limited views of liver, pancreas, spleen, portal                            and splenic vessels were all normal Procedure Code(s):        --- Professional ---                           [phone], Esophagogastroduodenoscopy, flexible,                            transoral; with endoscopic ultrasound examination                            limited to the esophagus, stomach or duodenum, and                            adjacent structures Diagnosis Code(s):        --- Professional ---                           K31.89, Other diseases of stomach and duodenum CPT copyright 2016 American Medical Association. All rights reserved. The codes documented in this report are preliminary and upon coder review may  be revised to meet current compliance requirements. Milus Banister, MD Milus Banister, MD 12/04/2015 10:17:47 AM Number of Addenda: 0

## 2015-12-09 DIAGNOSIS — M5412 Radiculopathy, cervical region: Secondary | ICD-10-CM | POA: Diagnosis not present

## 2015-12-09 DIAGNOSIS — M4802 Spinal stenosis, cervical region: Secondary | ICD-10-CM | POA: Diagnosis not present

## 2016-01-05 DIAGNOSIS — F17200 Nicotine dependence, unspecified, uncomplicated: Secondary | ICD-10-CM | POA: Diagnosis not present

## 2016-01-05 DIAGNOSIS — E860 Dehydration: Secondary | ICD-10-CM | POA: Diagnosis not present

## 2016-01-05 DIAGNOSIS — M539 Dorsopathy, unspecified: Secondary | ICD-10-CM | POA: Diagnosis not present

## 2016-01-05 DIAGNOSIS — E876 Hypokalemia: Secondary | ICD-10-CM | POA: Diagnosis not present

## 2016-01-05 DIAGNOSIS — Z6821 Body mass index (BMI) 21.0-21.9, adult: Secondary | ICD-10-CM | POA: Diagnosis not present

## 2016-01-05 DIAGNOSIS — E871 Hypo-osmolality and hyponatremia: Secondary | ICD-10-CM | POA: Diagnosis not present

## 2016-01-14 DIAGNOSIS — Z1231 Encounter for screening mammogram for malignant neoplasm of breast: Secondary | ICD-10-CM | POA: Diagnosis not present

## 2016-01-19 DIAGNOSIS — M47892 Other spondylosis, cervical region: Secondary | ICD-10-CM | POA: Diagnosis not present

## 2016-01-19 DIAGNOSIS — I10 Essential (primary) hypertension: Secondary | ICD-10-CM | POA: Diagnosis not present

## 2016-01-19 DIAGNOSIS — D492 Neoplasm of unspecified behavior of bone, soft tissue, and skin: Secondary | ICD-10-CM | POA: Diagnosis not present

## 2016-01-26 ENCOUNTER — Other Ambulatory Visit: Payer: Self-pay | Admitting: Neurosurgery

## 2016-01-28 DIAGNOSIS — F411 Generalized anxiety disorder: Secondary | ICD-10-CM | POA: Diagnosis not present

## 2016-01-28 DIAGNOSIS — Z6821 Body mass index (BMI) 21.0-21.9, adult: Secondary | ICD-10-CM | POA: Diagnosis not present

## 2016-01-28 DIAGNOSIS — J449 Chronic obstructive pulmonary disease, unspecified: Secondary | ICD-10-CM | POA: Diagnosis not present

## 2016-01-28 DIAGNOSIS — I10 Essential (primary) hypertension: Secondary | ICD-10-CM | POA: Diagnosis not present

## 2016-01-28 DIAGNOSIS — E876 Hypokalemia: Secondary | ICD-10-CM | POA: Diagnosis not present

## 2016-01-28 DIAGNOSIS — E785 Hyperlipidemia, unspecified: Secondary | ICD-10-CM | POA: Diagnosis not present

## 2016-01-28 DIAGNOSIS — R634 Abnormal weight loss: Secondary | ICD-10-CM | POA: Diagnosis not present

## 2016-01-28 DIAGNOSIS — D72829 Elevated white blood cell count, unspecified: Secondary | ICD-10-CM | POA: Diagnosis not present

## 2016-02-02 ENCOUNTER — Encounter (HOSPITAL_COMMUNITY): Payer: Self-pay

## 2016-02-02 ENCOUNTER — Encounter (HOSPITAL_COMMUNITY)
Admission: RE | Admit: 2016-02-02 | Discharge: 2016-02-02 | Disposition: A | Discharge status: Home / Self Care | Payer: Commercial Managed Care - HMO | Source: Ambulatory Visit | Attending: Neurosurgery | Admitting: Neurosurgery

## 2016-02-02 DIAGNOSIS — Z79899 Other long term (current) drug therapy: Secondary | ICD-10-CM | POA: Insufficient documentation

## 2016-02-02 DIAGNOSIS — Z01812 Encounter for preprocedural laboratory examination: Secondary | ICD-10-CM | POA: Diagnosis not present

## 2016-02-02 DIAGNOSIS — I10 Essential (primary) hypertension: Secondary | ICD-10-CM | POA: Insufficient documentation

## 2016-02-02 DIAGNOSIS — Z01818 Encounter for other preprocedural examination: Secondary | ICD-10-CM | POA: Insufficient documentation

## 2016-02-02 DIAGNOSIS — Z981 Arthrodesis status: Secondary | ICD-10-CM | POA: Insufficient documentation

## 2016-02-02 DIAGNOSIS — K219 Gastro-esophageal reflux disease without esophagitis: Secondary | ICD-10-CM | POA: Diagnosis not present

## 2016-02-02 DIAGNOSIS — I2510 Atherosclerotic heart disease of native coronary artery without angina pectoris: Secondary | ICD-10-CM | POA: Diagnosis not present

## 2016-02-02 DIAGNOSIS — D492 Neoplasm of unspecified behavior of bone, soft tissue, and skin: Secondary | ICD-10-CM | POA: Insufficient documentation

## 2016-02-02 DIAGNOSIS — Z955 Presence of coronary angioplasty implant and graft: Secondary | ICD-10-CM | POA: Diagnosis not present

## 2016-02-02 HISTORY — DX: Reserved for inherently not codable concepts without codable children: IMO0001

## 2016-02-02 LAB — CBC
HEMATOCRIT: 38.4 % (ref 36.0–46.0)
HEMOGLOBIN: 12.5 g/dL (ref 12.0–15.0)
MCH: 28.7 pg (ref 26.0–34.0)
MCHC: 32.6 g/dL (ref 30.0–36.0)
MCV: 88.1 fL (ref 78.0–100.0)
Platelets: 360 10*3/uL (ref 150–400)
RBC: 4.36 MIL/uL (ref 3.87–5.11)
RDW: 13.5 % (ref 11.5–15.5)
WBC: 13.3 10*3/uL — ABNORMAL HIGH (ref 4.0–10.5)

## 2016-02-02 LAB — BASIC METABOLIC PANEL
Anion gap: 11 (ref 5–15)
CHLORIDE: 100 mmol/L — AB (ref 101–111)
CO2: 26 mmol/L (ref 22–32)
Calcium: 8.5 mg/dL — ABNORMAL LOW (ref 8.9–10.3)
Creatinine, Ser: 0.47 mg/dL (ref 0.44–1.00)
GFR calc Af Amer: >60 mL/min (ref >60)
GFR calc non Af Amer: >60 mL/min (ref >60)
GLUCOSE: 105 mg/dL — AB (ref 65–99)
POTASSIUM: 3.2 mmol/L — AB (ref 3.5–5.1)
Sodium: 137 mmol/L (ref 135–145)

## 2016-02-02 LAB — TYPE AND SCREEN
ABO/RH(D): O POS
ANTIBODY SCREEN: NEGATIVE

## 2016-02-02 LAB — SURGICAL PCR SCREEN
MRSA, PCR: NEGATIVE
Staphylococcus aureus: NEGATIVE

## 2016-02-02 NOTE — Pre-Procedure Instructions (Signed)
Melinda Schroeder  02/02/2016      [facility] [address] [place] - [address] [place] [address] Phone: [phone] Fax: [phone]  [facility] [place], [place] - [place] [address] Phone: [phone] Fax: [phone]  [facility] - [place], [place] - [address] [place] [address] Phone: [phone] Fax: [phone]    Your procedure is scheduled on May 26  Report to [facility] at 0930 A.M.  Call this number if you have problems the morning of surgery:  [phone]   Remember:  Do not eat food or drink liquids after midnight.  Take these medicines the morning of surgery with A SIP OF WATER [place] if needed, Ativan if needed, Metoprolol, nitro if needed, omeprazole (prilosec), Paroxetine (paxil), Spiriva if needed  Bring inhalers with you on day of surgery  Stop taking aspirin, Advil, aleve, motrin, naproxen, ibuprofen, Goody's, BC's, herbal medications, vitamins, fish oil   Do not wear jewelry, make-up or nail polish.  Do not wear lotions, powders, or perfumes.  You may NOT wear deodorant.  Do not shave 48 hours prior to surgery.  Men may shave face and neck.  Do not bring valuables to the hospital.  [facility] is not responsible for any belongings or valuables.  Contacts, dentures or bridgework may not be worn into surgery.  Leave your suitcase in the car.  After surgery it may be brought to your room.  For patients admitted to the hospital, discharge time will be determined by your treatment team.  Patients discharged the day of surgery will not be allowed to drive home.    Special instructions:  [facility] - Preparing for Surgery  Before surgery, you can play an important role.  Because skin is not sterile, your skin needs to be as free of germs as possible.  You can reduce the number of  germs on you skin by washing with CHG (chlorahexidine gluconate) soap before surgery.  CHG is an antiseptic cleaner which kills germs and bonds with the skin to continue killing germs even after washing.  Please DO NOT use if you have an allergy to CHG or antibacterial soaps.  If your skin becomes reddened/irritated stop using the CHG and inform your nurse when you arrive at Short Stay.  Do not shave (including legs and underarms) for at least 48 hours prior to the first CHG shower.  You may shave your face.  Please follow these instructions carefully:   1.  Shower with CHG Soap the night before surgery and the                                morning of Surgery.  2.  If you choose to wash your hair, wash your hair first as usual with your       normal shampoo.  3.  After you shampoo, rinse your hair and body thoroughly to remove the                      Shampoo.  4.  Use CHG as you would any other liquid soap.  You can apply chg directly       to the skin and wash gently with scrungie or a clean washcloth.  5.  Apply the CHG Soap to your body ONLY FROM THE NECK DOWN.        Do not use on open wounds or open sores.  Avoid contact with your eyes,       ears, mouth and genitals (private parts).  Wash genitals (private parts)       with your normal soap.  6.  Wash thoroughly, paying special attention to the area where your surgery        will be performed.  7.  Thoroughly rinse your body with warm water from the neck down.  8.  DO NOT shower/wash with your normal soap after using and rinsing off       the CHG Soap.  9.  Pat yourself dry with a clean towel.            10.  Wear clean pajamas.            11.  Place clean sheets on your bed the night of your first shower and do not        sleep with pets.  Day of Surgery  Do not apply any lotions/deoderants the morning of surgery.  Please wear clean clothes to the hospital/surgery center.     Please read over the following fact sheets that you  were given. Pain Booklet, Coughing and Deep Breathing, Blood Transfusion Information, MRSA Information and Surgical Site Infection Prevention

## 2016-02-02 NOTE — Progress Notes (Signed)
PCP- Amy moon, NP Cardiology: been 3 years since she has seen a cardiologist.  Pt states she has had stent placements >15 years ago.  She saw Dr. Hyman Hopes here and then at [facility] she saw Dr. Bettina Gavia Echo: denies Stress test: denies  Pt denies chest pain and shortness of breath

## 2016-02-03 NOTE — Progress Notes (Signed)
Anesthesia Chart Review:  Pt is a 75 year old female scheduled for C5-6 posterior cervical fusion with lateral mass fixation, C1-2 tumor resection on 02/06/2016 with Dr. Christella Noa.   PCP is Laverna Critz, NP.   PMH includes:  CAD (s/p stent 1991 years ago), HTN, hyperlipidemia, post-op N/V, GERD. Current smoker. BMI 22. S/p ACDF 06/08/13.   Medications include losartan-hctz, metoprolol, prilosec, potassium, simvastatin, spiriva  Preoperative labs reviewed.    EKG 02/02/16: NSR. Nonspecific ST and T wave abnormality  She had a normal stress echo on 11/17/11 (correspondence 06/10/13 in media tab).   It appears pt used to follow with Dr. Shirlee More at [facility] in [place]. Pt reports she hasn't seen cardiology in years. We have an office visit dated 11/17/11; plan states pt can follow up prn (correspondence 06/10/13 in media tab).   Pt denied CV sx at PAT.   Reviewed case with Dr. Gifford Shave.   If no changes, I anticipate pt can proceed with surgery as scheduled.   Willeen Cass, FNP-BC [facility]/Anesthesiology Phone: [phone] 02/03/2016 4:43 PM

## 2016-02-10 MED ORDER — CEFAZOLIN SODIUM-DEXTROSE 2-4 GM/100ML-% IV SOLN
2.0000 g | INTRAVENOUS | Status: AC
  Administered 2016-02-11: 2 g via INTRAVENOUS
  Filled 2016-02-10: qty 100

## 2016-02-11 ENCOUNTER — Encounter (HOSPITAL_COMMUNITY)
Admission: RE | Disposition: A | Discharge status: Home Health | Payer: Self-pay | Source: Ambulatory Visit | Attending: Neurosurgery

## 2016-02-11 ENCOUNTER — Encounter (HOSPITAL_COMMUNITY): Payer: Self-pay | Admitting: *Deleted

## 2016-02-11 ENCOUNTER — Inpatient Hospital Stay (HOSPITAL_COMMUNITY): Payer: Commercial Managed Care - HMO | Admitting: Certified Registered"

## 2016-02-11 ENCOUNTER — Inpatient Hospital Stay (HOSPITAL_COMMUNITY)
Admission: RE | Admit: 2016-02-11 | Discharge: 2016-02-17 | DRG: 473 | Disposition: A | Discharge status: Home Health | Payer: Commercial Managed Care - HMO | Source: Ambulatory Visit | Attending: Neurosurgery | Admitting: Neurosurgery

## 2016-02-11 ENCOUNTER — Inpatient Hospital Stay (HOSPITAL_COMMUNITY): Payer: Commercial Managed Care - HMO

## 2016-02-11 ENCOUNTER — Inpatient Hospital Stay (HOSPITAL_COMMUNITY): Payer: Commercial Managed Care - HMO | Admitting: Emergency Medicine

## 2016-02-11 DIAGNOSIS — D492 Neoplasm of unspecified behavior of bone, soft tissue, and skin: Secondary | ICD-10-CM | POA: Diagnosis not present

## 2016-02-11 DIAGNOSIS — J449 Chronic obstructive pulmonary disease, unspecified: Secondary | ICD-10-CM | POA: Diagnosis not present

## 2016-02-11 DIAGNOSIS — I10 Essential (primary) hypertension: Secondary | ICD-10-CM | POA: Diagnosis present

## 2016-02-11 DIAGNOSIS — F17210 Nicotine dependence, cigarettes, uncomplicated: Secondary | ICD-10-CM | POA: Diagnosis present

## 2016-02-11 DIAGNOSIS — I2510 Atherosclerotic heart disease of native coronary artery without angina pectoris: Secondary | ICD-10-CM | POA: Diagnosis present

## 2016-02-11 DIAGNOSIS — D3610 Benign neoplasm of peripheral nerves and autonomic nervous system, unspecified: Secondary | ICD-10-CM | POA: Diagnosis not present

## 2016-02-11 DIAGNOSIS — M47812 Spondylosis without myelopathy or radiculopathy, cervical region: Secondary | ICD-10-CM | POA: Diagnosis not present

## 2016-02-11 DIAGNOSIS — Z955 Presence of coronary angioplasty implant and graft: Secondary | ICD-10-CM

## 2016-02-11 DIAGNOSIS — M4322 Fusion of spine, cervical region: Secondary | ICD-10-CM | POA: Diagnosis not present

## 2016-02-11 DIAGNOSIS — C479 Malignant neoplasm of peripheral nerves and autonomic nervous system, unspecified: Secondary | ICD-10-CM | POA: Diagnosis not present

## 2016-02-11 DIAGNOSIS — Z419 Encounter for procedure for purposes other than remedying health state, unspecified: Secondary | ICD-10-CM

## 2016-02-11 DIAGNOSIS — M47892 Other spondylosis, cervical region: Secondary | ICD-10-CM | POA: Diagnosis not present

## 2016-02-11 HISTORY — PX: LAMINECTOMY: SHX219

## 2016-02-11 SURGERY — CERVICAL LAMINECTOMY FOR TUMOR
Anesthesia: General | Laterality: Left

## 2016-02-11 MED ORDER — PHENYLEPHRINE HCL 10 MG/ML IJ SOLN
INTRAMUSCULAR | Status: DC | PRN
  Administered 2016-02-11: 120 ug via INTRAVENOUS
  Administered 2016-02-11 (×3): 20 ug via INTRAVENOUS
  Administered 2016-02-11 (×2): 40 ug via INTRAVENOUS
  Administered 2016-02-11: 20 ug via INTRAVENOUS

## 2016-02-11 MED ORDER — HYDRALAZINE HCL 20 MG/ML IJ SOLN
INTRAMUSCULAR | Status: DC | PRN
  Administered 2016-02-11 (×2): 5 mg via INTRAVENOUS

## 2016-02-11 MED ORDER — TIOTROPIUM BROMIDE MONOHYDRATE 18 MCG IN CAPS
18.0000 ug | ORAL_CAPSULE | Freq: Every day | RESPIRATORY_TRACT | Status: DC | PRN
  Filled 2016-02-11: qty 5

## 2016-02-11 MED ORDER — HYDROCHLOROTHIAZIDE 12.5 MG PO CAPS
12.5000 mg | ORAL_CAPSULE | Freq: Every day | ORAL | Status: DC
  Administered 2016-02-12 – 2016-02-17 (×5): 12.5 mg via ORAL
  Filled 2016-02-11 (×5): qty 1

## 2016-02-11 MED ORDER — LABETALOL HCL 5 MG/ML IV SOLN
5.0000 mg | Freq: Once | INTRAVENOUS | Status: AC
  Administered 2016-02-11: 5 mg via INTRAVENOUS

## 2016-02-11 MED ORDER — SODIUM CHLORIDE 0.9 % IV SOLN: 250.0000 mL | INTRAVENOUS | Status: DC

## 2016-02-11 MED ORDER — PHENOL 1.4 % MT LIQD: 1.0000 | OROMUCOSAL | Status: DC | PRN

## 2016-02-11 MED ORDER — PROMETHAZINE HCL 25 MG PO TABS
12.5000 mg | ORAL_TABLET | Freq: Four times a day (QID) | ORAL | Status: DC | PRN
  Administered 2016-02-11: 12.5 mg via ORAL
  Filled 2016-02-11: qty 1

## 2016-02-11 MED ORDER — DOCUSATE SODIUM 100 MG PO CAPS
100.0000 mg | ORAL_CAPSULE | Freq: Two times a day (BID) | ORAL | Status: DC
  Administered 2016-02-12 – 2016-02-17 (×9): 100 mg via ORAL
  Filled 2016-02-11 (×12): qty 1

## 2016-02-11 MED ORDER — FENTANYL CITRATE (PF) 250 MCG/5ML IJ SOLN
INTRAMUSCULAR | Status: AC
  Filled 2016-02-11: qty 5

## 2016-02-11 MED ORDER — LOSARTAN POTASSIUM 50 MG PO TABS
100.0000 mg | ORAL_TABLET | Freq: Every day | ORAL | Status: DC
  Administered 2016-02-12 – 2016-02-17 (×5): 100 mg via ORAL
  Filled 2016-02-11 (×6): qty 2

## 2016-02-11 MED ORDER — FENTANYL CITRATE (PF) 250 MCG/5ML IJ SOLN
INTRAMUSCULAR | Status: DC | PRN
  Administered 2016-02-11 (×5): 50 ug via INTRAVENOUS
  Administered 2016-02-11: 100 ug via INTRAVENOUS
  Administered 2016-02-11: 50 ug via INTRAVENOUS

## 2016-02-11 MED ORDER — POTASSIUM CHLORIDE IN NACL 20-0.9 MEQ/L-% IV SOLN
INTRAVENOUS | Status: DC
  Administered 2016-02-11 – 2016-02-12 (×2): via INTRAVENOUS
  Filled 2016-02-11 (×3): qty 1000

## 2016-02-11 MED ORDER — THROMBIN 20000 UNITS EX SOLR
CUTANEOUS | Status: DC | PRN
  Administered 2016-02-11: 11:00:00 via TOPICAL

## 2016-02-11 MED ORDER — OXYCODONE-ACETAMINOPHEN 5-325 MG PO TABS
1.0000 | ORAL_TABLET | ORAL | Status: DC | PRN
  Administered 2016-02-11 – 2016-02-14 (×9): 2 via ORAL
  Administered 2016-02-15 – 2016-02-16 (×6): 1 via ORAL
  Administered 2016-02-17: 2 via ORAL
  Administered 2016-02-17: 1 via ORAL
  Filled 2016-02-11 (×5): qty 2
  Filled 2016-02-11: qty 1
  Filled 2016-02-11: qty 2
  Filled 2016-02-11 (×2): qty 1
  Filled 2016-02-11 (×3): qty 2
  Filled 2016-02-11: qty 1
  Filled 2016-02-11: qty 2
  Filled 2016-02-11 (×2): qty 1
  Filled 2016-02-11 (×3): qty 2
  Filled 2016-02-11: qty 1

## 2016-02-11 MED ORDER — PROPOFOL 10 MG/ML IV BOLUS
INTRAVENOUS | Status: AC
  Filled 2016-02-11: qty 20

## 2016-02-11 MED ORDER — NITROGLYCERIN 0.4 MG SL SUBL: 0.4000 mg | SUBLINGUAL_TABLET | SUBLINGUAL | Status: DC | PRN

## 2016-02-11 MED ORDER — PROPOFOL 10 MG/ML IV BOLUS
INTRAVENOUS | Status: DC | PRN
  Administered 2016-02-11: 100 mg via INTRAVENOUS
  Administered 2016-02-11: 30 mg via INTRAVENOUS
  Administered 2016-02-11: 20 mg via INTRAVENOUS
  Administered 2016-02-11: 150 mg via INTRAVENOUS

## 2016-02-11 MED ORDER — MIRTAZAPINE 15 MG PO TABS
15.0000 mg | ORAL_TABLET | Freq: Every day | ORAL | Status: DC
  Administered 2016-02-11 – 2016-02-16 (×6): 15 mg via ORAL
  Filled 2016-02-11 (×6): qty 1

## 2016-02-11 MED ORDER — ROCURONIUM BROMIDE 50 MG/5ML IV SOLN
INTRAVENOUS | Status: AC
  Filled 2016-02-11: qty 1

## 2016-02-11 MED ORDER — ESMOLOL HCL 100 MG/10ML IV SOLN
INTRAVENOUS | Status: AC
  Filled 2016-02-11: qty 10

## 2016-02-11 MED ORDER — LOSARTAN POTASSIUM-HCTZ 100-12.5 MG PO TABS: 1.0000 | ORAL_TABLET | Freq: Every morning | ORAL | Status: DC

## 2016-02-11 MED ORDER — ONDANSETRON HCL 4 MG/2ML IJ SOLN
INTRAMUSCULAR | Status: DC | PRN
  Administered 2016-02-11: 4 mg via INTRAVENOUS

## 2016-02-11 MED ORDER — 0.9 % SODIUM CHLORIDE (POUR BTL) OPTIME
TOPICAL | Status: DC | PRN
  Administered 2016-02-11: 1000 mL

## 2016-02-11 MED ORDER — ACETAMINOPHEN 650 MG RE SUPP: 650.0000 mg | RECTAL | Status: DC | PRN

## 2016-02-11 MED ORDER — ZOLPIDEM TARTRATE 5 MG PO TABS: 5.0000 mg | ORAL_TABLET | Freq: Every evening | ORAL | Status: DC | PRN

## 2016-02-11 MED ORDER — ONDANSETRON HCL 4 MG/2ML IJ SOLN
INTRAMUSCULAR | Status: AC
  Filled 2016-02-11: qty 2

## 2016-02-11 MED ORDER — LACTATED RINGERS IV SOLN
INTRAVENOUS | Status: DC
  Administered 2016-02-11 (×2): via INTRAVENOUS

## 2016-02-11 MED ORDER — LIDOCAINE 2% (20 MG/ML) 5 ML SYRINGE
INTRAMUSCULAR | Status: AC
  Filled 2016-02-11: qty 5

## 2016-02-11 MED ORDER — HYDRALAZINE HCL 20 MG/ML IJ SOLN
INTRAMUSCULAR | Status: AC
  Filled 2016-02-11: qty 1

## 2016-02-11 MED ORDER — PHENYLEPHRINE HCL 10 MG/ML IJ SOLN: INTRAMUSCULAR | Status: DC | PRN

## 2016-02-11 MED ORDER — ACETAMINOPHEN 325 MG PO TABS: 650.0000 mg | ORAL_TABLET | ORAL | Status: DC | PRN

## 2016-02-11 MED ORDER — MORPHINE SULFATE (PF) 2 MG/ML IV SOLN
1.0000 mg | INTRAVENOUS | Status: DC | PRN
  Administered 2016-02-11: 2 mg via INTRAVENOUS
  Administered 2016-02-11: 4 mg via INTRAVENOUS
  Administered 2016-02-11 – 2016-02-12 (×3): 2 mg via INTRAVENOUS
  Administered 2016-02-12: 4 mg via INTRAVENOUS
  Administered 2016-02-14: 2 mg via INTRAVENOUS
  Filled 2016-02-11 (×5): qty 1
  Filled 2016-02-11: qty 2
  Filled 2016-02-11 (×2): qty 1
  Filled 2016-02-11: qty 2

## 2016-02-11 MED ORDER — OXYCODONE HCL 5 MG/5ML PO SOLN: 5.0000 mg | Freq: Once | ORAL | Status: DC | PRN

## 2016-02-11 MED ORDER — MENTHOL 3 MG MT LOZG: 1.0000 | LOZENGE | OROMUCOSAL | Status: DC | PRN

## 2016-02-11 MED ORDER — BISACODYL 5 MG PO TBEC: 5.0000 mg | DELAYED_RELEASE_TABLET | Freq: Every day | ORAL | Status: DC | PRN

## 2016-02-11 MED ORDER — ROCURONIUM BROMIDE 100 MG/10ML IV SOLN
INTRAVENOUS | Status: DC | PRN
  Administered 2016-02-11 (×5): 10 mg via INTRAVENOUS
  Administered 2016-02-11: 40 mg via INTRAVENOUS
  Administered 2016-02-11: 10 mg via INTRAVENOUS

## 2016-02-11 MED ORDER — EPHEDRINE 5 MG/ML INJ
INTRAVENOUS | Status: AC
  Filled 2016-02-11: qty 10

## 2016-02-11 MED ORDER — PAROXETINE HCL 20 MG PO TABS
20.0000 mg | ORAL_TABLET | Freq: Every day | ORAL | Status: DC
  Administered 2016-02-12 – 2016-02-17 (×5): 20 mg via ORAL
  Filled 2016-02-11 (×6): qty 1

## 2016-02-11 MED ORDER — BUPIVACAINE HCL (PF) 0.5 % IJ SOLN
INTRAMUSCULAR | Status: DC | PRN
  Administered 2016-02-11: 30 mL

## 2016-02-11 MED ORDER — DEXTROSE 5 % IV SOLN
10.0000 mg | INTRAVENOUS | Status: DC | PRN
  Administered 2016-02-11: 20 ug/min via INTRAVENOUS

## 2016-02-11 MED ORDER — ONDANSETRON HCL 4 MG/2ML IJ SOLN
4.0000 mg | INTRAMUSCULAR | Status: DC | PRN
  Administered 2016-02-11 – 2016-02-16 (×5): 4 mg via INTRAVENOUS
  Filled 2016-02-11 (×5): qty 2

## 2016-02-11 MED ORDER — SENNOSIDES-DOCUSATE SODIUM 8.6-50 MG PO TABS
1.0000 | ORAL_TABLET | Freq: Every evening | ORAL | Status: DC | PRN
  Administered 2016-02-15: 1 via ORAL
  Filled 2016-02-11 (×2): qty 1

## 2016-02-11 MED ORDER — LACTATED RINGERS IV SOLN
INTRAVENOUS | Status: DC | PRN
  Administered 2016-02-11: 10:00:00 via INTRAVENOUS

## 2016-02-11 MED ORDER — ESMOLOL HCL 100 MG/10ML IV SOLN
INTRAVENOUS | Status: DC | PRN
  Administered 2016-02-11 (×3): 10 mg via INTRAVENOUS
  Administered 2016-02-11: 20 mg via INTRAVENOUS

## 2016-02-11 MED ORDER — HYDROCODONE-ACETAMINOPHEN 10-325 MG PO TABS
1.0000 | ORAL_TABLET | Freq: Four times a day (QID) | ORAL | Status: DC | PRN
  Administered 2016-02-15 – 2016-02-17 (×2): 1 via ORAL
  Filled 2016-02-11 (×2): qty 1

## 2016-02-11 MED ORDER — POTASSIUM CHLORIDE CRYS ER 10 MEQ PO TBCR
10.0000 meq | EXTENDED_RELEASE_TABLET | Freq: Two times a day (BID) | ORAL | Status: DC
  Administered 2016-02-11 – 2016-02-17 (×11): 10 meq via ORAL
  Filled 2016-02-11 (×12): qty 1

## 2016-02-11 MED ORDER — CLONIDINE HCL 0.1 MG PO TABS: 0.1000 mg | ORAL_TABLET | Freq: Two times a day (BID) | ORAL | Status: DC | PRN

## 2016-02-11 MED ORDER — PHENYLEPHRINE 40 MCG/ML (10ML) SYRINGE FOR IV PUSH (FOR BLOOD PRESSURE SUPPORT)
PREFILLED_SYRINGE | INTRAVENOUS | Status: AC
  Filled 2016-02-11: qty 20

## 2016-02-11 MED ORDER — SUGAMMADEX SODIUM 200 MG/2ML IV SOLN
INTRAVENOUS | Status: DC | PRN
  Administered 2016-02-11: 100 mg via INTRAVENOUS

## 2016-02-11 MED ORDER — LABETALOL HCL 5 MG/ML IV SOLN
INTRAVENOUS | Status: AC
  Administered 2016-02-11: 5 mg
  Filled 2016-02-11: qty 4

## 2016-02-11 MED ORDER — SODIUM CHLORIDE 0.9% FLUSH: 3.0000 mL | INTRAVENOUS | Status: DC | PRN

## 2016-02-11 MED ORDER — DIAZEPAM 5 MG PO TABS
5.0000 mg | ORAL_TABLET | Freq: Four times a day (QID) | ORAL | Status: DC | PRN
  Administered 2016-02-11 – 2016-02-14 (×6): 5 mg via ORAL
  Filled 2016-02-11 (×6): qty 1

## 2016-02-11 MED ORDER — ONDANSETRON HCL 4 MG/2ML IJ SOLN
4.0000 mg | Freq: Four times a day (QID) | INTRAMUSCULAR | Status: AC | PRN
  Administered 2016-02-11: 4 mg via INTRAVENOUS

## 2016-02-11 MED ORDER — EPHEDRINE SULFATE 50 MG/ML IJ SOLN
INTRAMUSCULAR | Status: DC | PRN
  Administered 2016-02-11: 15 mg via INTRAVENOUS
  Administered 2016-02-11 (×3): 5 mg via INTRAVENOUS

## 2016-02-11 MED ORDER — FENTANYL CITRATE (PF) 100 MCG/2ML IJ SOLN
25.0000 ug | INTRAMUSCULAR | Status: DC | PRN
  Administered 2016-02-11 (×2): 50 ug via INTRAVENOUS

## 2016-02-11 MED ORDER — PANTOPRAZOLE SODIUM 40 MG PO TBEC
40.0000 mg | DELAYED_RELEASE_TABLET | Freq: Every day | ORAL | Status: DC
  Administered 2016-02-11 – 2016-02-17 (×6): 40 mg via ORAL
  Filled 2016-02-11 (×7): qty 1

## 2016-02-11 MED ORDER — PHENYLEPHRINE 40 MCG/ML (10ML) SYRINGE FOR IV PUSH (FOR BLOOD PRESSURE SUPPORT)
PREFILLED_SYRINGE | INTRAVENOUS | Status: AC
  Filled 2016-02-11: qty 10

## 2016-02-11 MED ORDER — LIDOCAINE HCL (CARDIAC) 20 MG/ML IV SOLN
INTRAVENOUS | Status: DC | PRN
  Administered 2016-02-11: 80 mg via INTRAVENOUS

## 2016-02-11 MED ORDER — LORAZEPAM 0.5 MG PO TABS: 0.5000 mg | ORAL_TABLET | Freq: Four times a day (QID) | ORAL | Status: DC | PRN

## 2016-02-11 MED ORDER — LABETALOL HCL 5 MG/ML IV SOLN
5.0000 mg | INTRAVENOUS | Status: DC | PRN
Start: 2016-02-11 — End: 2016-02-17

## 2016-02-11 MED ORDER — ATORVASTATIN CALCIUM 40 MG PO TABS
40.0000 mg | ORAL_TABLET | Freq: Every day | ORAL | Status: DC
  Administered 2016-02-11 – 2016-02-16 (×6): 40 mg via ORAL
  Filled 2016-02-11 (×6): qty 1

## 2016-02-11 MED ORDER — METOPROLOL SUCCINATE ER 100 MG PO TB24
100.0000 mg | ORAL_TABLET | Freq: Two times a day (BID) | ORAL | Status: DC
  Administered 2016-02-11 – 2016-02-17 (×9): 100 mg via ORAL
  Filled 2016-02-11 (×12): qty 1

## 2016-02-11 MED ORDER — OXYCODONE HCL 5 MG PO TABS: 5.0000 mg | ORAL_TABLET | Freq: Once | ORAL | Status: DC | PRN

## 2016-02-11 MED ORDER — MIDAZOLAM HCL 2 MG/2ML IJ SOLN
INTRAMUSCULAR | Status: AC
  Filled 2016-02-11: qty 2

## 2016-02-11 MED ORDER — HEMOSTATIC AGENTS (NO CHARGE) OPTIME
TOPICAL | Status: DC | PRN
  Administered 2016-02-11 (×2): 1 via TOPICAL

## 2016-02-11 MED ORDER — LIDOCAINE-EPINEPHRINE 0.5 %-1:200000 IJ SOLN
INTRAMUSCULAR | Status: DC | PRN
  Administered 2016-02-11: 10 mL

## 2016-02-11 MED ORDER — SUGAMMADEX SODIUM 200 MG/2ML IV SOLN
INTRAVENOUS | Status: AC
  Filled 2016-02-11: qty 2

## 2016-02-11 MED ORDER — ARTIFICIAL TEARS OP OINT
TOPICAL_OINTMENT | OPHTHALMIC | Status: AC
  Filled 2016-02-11: qty 3.5

## 2016-02-11 MED ORDER — FENTANYL CITRATE (PF) 100 MCG/2ML IJ SOLN
INTRAMUSCULAR | Status: AC
  Administered 2016-02-11: 50 ug via INTRAVENOUS
  Filled 2016-02-11: qty 2

## 2016-02-11 MED ORDER — MAGNESIUM CITRATE PO SOLN: 1.0000 | Freq: Once | ORAL | Status: DC | PRN

## 2016-02-11 MED ORDER — SODIUM CHLORIDE 0.9% FLUSH
3.0000 mL | Freq: Two times a day (BID) | INTRAVENOUS | Status: DC
  Administered 2016-02-13 – 2016-02-17 (×6): 3 mL via INTRAVENOUS

## 2016-02-11 MED ORDER — POVIDONE-IODINE 10 % EX OINT
TOPICAL_OINTMENT | CUTANEOUS | Status: DC | PRN
  Administered 2016-02-11: 1 via TOPICAL

## 2016-02-11 SURGICAL SUPPLY — 107 items
APL SKNCLS STERI-STRIP NONHPOA (GAUZE/BANDAGES/DRESSINGS)
APL SRG 60D 8 XTD TIP BNDBL (TIP) ×1
BAG DECANTER FOR FLEXI CONT (MISCELLANEOUS) ×3 IMPLANT
BENZOIN TINCTURE PRP APPL 2/3 (GAUZE/BANDAGES/DRESSINGS) IMPLANT
BIT DRILL 2.4X (BIT) IMPLANT
BIT DRL 2.4X (BIT) ×1
BLADE CLIPPER SURG (BLADE) ×2 IMPLANT
BLADE SURG 11 STRL SS (BLADE) ×5 IMPLANT
BLADE ULTRA TIP 2M (BLADE) IMPLANT
BUR MATCHSTICK NEURO 3.0 LAGG (BURR) IMPLANT
CANISTER SUCT 3000ML PPV (MISCELLANEOUS) ×3 IMPLANT
CLOSURE WOUND 1/2 X4 (GAUZE/BANDAGES/DRESSINGS) ×1
CLOSURE WOUND 1/4X4 (GAUZE/BANDAGES/DRESSINGS)
CONT SPEC 4OZ CLIKSEAL STRL BL (MISCELLANEOUS) ×2 IMPLANT
COVER BACK TABLE 24X17X13 BIG (DRAPES) IMPLANT
COVER TABLE BACK 60X90 (DRAPES) ×2 IMPLANT
DECANTER SPIKE VIAL GLASS SM (MISCELLANEOUS) ×3 IMPLANT
DEPRESSOR TONGUE BLADE STERILE (MISCELLANEOUS) IMPLANT
DRAPE LAPAROTOMY 100X72 PEDS (DRAPES) ×3 IMPLANT
DRAPE MICROSCOPE LEICA (MISCELLANEOUS) ×3 IMPLANT
DRAPE POUCH INSTRU U-SHP 10X18 (DRAPES) ×3 IMPLANT
DRILL BIT (BIT) ×3
DRSG OPSITE POSTOP 4X6 (GAUZE/BANDAGES/DRESSINGS) ×2 IMPLANT
DURAPREP 6ML APPLICATOR 50/CS (WOUND CARE) ×3 IMPLANT
DURASEAL APPLICATOR TIP (TIP) ×2 IMPLANT
DURASEAL SPINE SEALANT 3ML (MISCELLANEOUS) ×2 IMPLANT
ELECT REM PT RETURN 9FT ADLT (ELECTROSURGICAL) ×3
ELECTRODE REM PT RTRN 9FT ADLT (ELECTROSURGICAL) ×1 IMPLANT
GAUZE SPONGE 4X4 12PLY STRL (GAUZE/BANDAGES/DRESSINGS) ×1 IMPLANT
GAUZE SPONGE 4X4 16PLY XRAY LF (GAUZE/BANDAGES/DRESSINGS) IMPLANT
GLOVE BIO SURGEON STRL SZ 6.5 (GLOVE) IMPLANT
GLOVE BIO SURGEON STRL SZ7 (GLOVE) IMPLANT
GLOVE BIO SURGEON STRL SZ7.5 (GLOVE) IMPLANT
GLOVE BIO SURGEON STRL SZ8 (GLOVE) ×4 IMPLANT
GLOVE BIO SURGEON STRL SZ8.5 (GLOVE) IMPLANT
GLOVE BIO SURGEONS STRL SZ 6.5 (GLOVE)
GLOVE BIOGEL M 8.0 STRL (GLOVE) IMPLANT
GLOVE BIOGEL PI IND STRL 6.5 (GLOVE) IMPLANT
GLOVE BIOGEL PI IND STRL 8 (GLOVE) IMPLANT
GLOVE BIOGEL PI INDICATOR 6.5 (GLOVE) ×8
GLOVE BIOGEL PI INDICATOR 8 (GLOVE) ×8
GLOVE ECLIPSE 6.5 STRL STRAW (GLOVE) ×3 IMPLANT
GLOVE ECLIPSE 7.0 STRL STRAW (GLOVE) IMPLANT
GLOVE ECLIPSE 7.5 STRL STRAW (GLOVE) IMPLANT
GLOVE ECLIPSE 8.0 STRL XLNG CF (GLOVE) IMPLANT
GLOVE ECLIPSE 8.5 STRL (GLOVE) IMPLANT
GLOVE EXAM NITRILE LRG STRL (GLOVE) IMPLANT
GLOVE EXAM NITRILE MD LF STRL (GLOVE) IMPLANT
GLOVE EXAM NITRILE XL STR (GLOVE) IMPLANT
GLOVE EXAM NITRILE XS STR PU (GLOVE) IMPLANT
GLOVE INDICATOR 6.5 STRL GRN (GLOVE) IMPLANT
GLOVE INDICATOR 7.0 STRL GRN (GLOVE) IMPLANT
GLOVE INDICATOR 7.5 STRL GRN (GLOVE) IMPLANT
GLOVE INDICATOR 8.0 STRL GRN (GLOVE) IMPLANT
GLOVE INDICATOR 8.5 STRL (GLOVE) ×4 IMPLANT
GLOVE OPTIFIT SS 8.0 STRL (GLOVE) IMPLANT
GLOVE SURG SS PI 6.5 STRL IVOR (GLOVE) ×12 IMPLANT
GOWN STRL REUS W/ TWL LRG LVL3 (GOWN DISPOSABLE) ×2 IMPLANT
GOWN STRL REUS W/ TWL XL LVL3 (GOWN DISPOSABLE) IMPLANT
GOWN STRL REUS W/TWL 2XL LVL3 (GOWN DISPOSABLE) ×4 IMPLANT
GOWN STRL REUS W/TWL LRG LVL3 (GOWN DISPOSABLE) ×12
GOWN STRL REUS W/TWL XL LVL3 (GOWN DISPOSABLE) ×6
GRAFT DURAGEN MATRIX 1WX1L (Tissue) ×2 IMPLANT
HEMOSTAT SURGICEL 2X14 (HEMOSTASIS) ×2 IMPLANT
KIT BASIN OR (CUSTOM PROCEDURE TRAY) ×3 IMPLANT
KIT ROOM TURNOVER OR (KITS) ×3 IMPLANT
LIQUID BAND (GAUZE/BANDAGES/DRESSINGS) ×2 IMPLANT
MARKER SKIN DUAL TIP RULER LAB (MISCELLANEOUS) ×1 IMPLANT
NDL HYPO 25X1 1.5 SAFETY (NEEDLE) ×1 IMPLANT
NDL SPNL 22GX3.5 QUINCKE BK (NEEDLE) ×1 IMPLANT
NEEDLE HYPO 25X1 1.5 SAFETY (NEEDLE) ×3 IMPLANT
NEEDLE SPNL 22GX3.5 QUINCKE BK (NEEDLE) ×3 IMPLANT
NS IRRIG 1000ML POUR BTL (IV SOLUTION) ×3 IMPLANT
PACK LAMINECTOMY NEURO (CUSTOM PROCEDURE TRAY) ×3 IMPLANT
PACK VITOSS BIOACTIVE 2.5CC (Neuro Prosthesis/Implant) ×2 IMPLANT
PAD EYE OVAL STERILE LF (GAUZE/BANDAGES/DRESSINGS) IMPLANT
PATTIES SURGICAL .25X.25 (GAUZE/BANDAGES/DRESSINGS) ×2 IMPLANT
PATTIES SURGICAL .5 X.5 (GAUZE/BANDAGES/DRESSINGS) IMPLANT
PATTIES SURGICAL .5 X3 (DISPOSABLE) IMPLANT
ROD VERTEX 40MM (Rod) ×2 IMPLANT
RUBBERBAND STERILE (MISCELLANEOUS) ×6 IMPLANT
SCREW 3.5X12 (Screw) ×12 IMPLANT
SCREW BN 12X3.5XMA SPNE (Screw) IMPLANT
SCREW SET M6 (Screw) ×8 IMPLANT
SPONGE LAP 4X18 X RAY DECT (DISPOSABLE) IMPLANT
SPONGE NEURO XRAY DETECT 1X3 (DISPOSABLE) IMPLANT
SPONGE SURGIFOAM ABS GEL 100 (HEMOSTASIS) ×3 IMPLANT
STAPLER SKIN PROX WIDE 3.9 (STAPLE) IMPLANT
STRIP CLOSURE SKIN 1/2X4 (GAUZE/BANDAGES/DRESSINGS) ×1 IMPLANT
STRIP CLOSURE SKIN 1/4X4 (GAUZE/BANDAGES/DRESSINGS) IMPLANT
SUT ETHILON 3 0 FSL (SUTURE) IMPLANT
SUT NURALON 4 0 TR CR/8 (SUTURE) ×5 IMPLANT
SUT SILK 4 0 (SUTURE) ×6
SUT SILK 4-0 TF-4 18XBRD (SUTURE) IMPLANT
SUT SILK 6 0 BV 1XDISCX (SUTURE) IMPLANT
SUT VIC AB 0 CT1 18XCR BRD8 (SUTURE) IMPLANT
SUT VIC AB 0 CT1 8-18 (SUTURE)
SUT VIC AB 2-0 CT1 18 (SUTURE) ×3 IMPLANT
SUT VIC AB 3-0 SH 8-18 (SUTURE) ×5 IMPLANT
TIP NONSTICK .5MMX23CM (INSTRUMENTS) ×3
TIP NONSTICK .5X23 (INSTRUMENTS) IMPLANT
TIP SONASTAR STD MISONIX 1.9 (TRAY / TRAY PROCEDURE) IMPLANT
TOWEL OR 17X24 6PK STRL BLUE (TOWEL DISPOSABLE) ×3 IMPLANT
TOWEL OR 17X26 10 PK STRL BLUE (TOWEL DISPOSABLE) ×3 IMPLANT
TRAY FOLEY W/METER SILVER 16FR (SET/KITS/TRAYS/PACK) IMPLANT
UNDERPAD 30X30 INCONTINENT (UNDERPADS AND DIAPERS) ×1 IMPLANT
WATER STERILE IRR 1000ML POUR (IV SOLUTION) ×3 IMPLANT

## 2016-02-11 NOTE — Anesthesia Procedure Notes (Signed)
Procedure Name: Intubation Date/Time: 02/11/2016 9:46 AM Performed by: Barrington Ellison Pre-anesthesia Checklist: Patient identified, Emergency Drugs available, Suction available, Patient being monitored and Timeout performed Patient Re-evaluated:Patient Re-evaluated prior to inductionOxygen Delivery Method: Circle system utilized Preoxygenation: Pre-oxygenation with 100% oxygen Intubation Type: IV induction Ventilation: Mask ventilation without difficulty Laryngoscope Size: Mac and 3 Grade View: Grade I Tube type: Oral Tube size: 7.0 mm Number of attempts: 1 Airway Equipment and Method: Stylet Placement Confirmation: ETT inserted through vocal cords under direct vision,  positive ETCO2 and breath sounds checked- equal and bilateral Secured at: 21 cm Tube secured with: Tape Dental Injury: Teeth and Oropharynx as per pre-operative assessment

## 2016-02-11 NOTE — H&P (Signed)
BP 164/74 mmHg  Pulse 69  Temp(Src) 98.4 F (36.9 C)  Resp 20  Ht 5' (1.524 m)  Wt 50.803 kg (112 lb)  BMI 21.87 kg/m2  SpO2 100%    Melinda Schroeder is a woman whom I took to the operating room for a severe facet arthropathy at C4-C5 eccentric to the right side.  She had that operation and she did well until recently.  She is now having pain again on the right side of her neck and after the MRI was told that she had some sort of tumor in the spine.  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  That is on the left side and I do not think it has any role to play whatsoever in the right-sided pain that she is reporting.  She has no weakness in the upper extremities or the leg.  On the right side, she has severe inflammation, it appears at C5-C6 now.  What I am going to have Melinda Schroeder undergo are injections to the C5-C6 facet on the right side.  We should operate on the nerve sheath tumor because two years ago when we did an MRI of the cervical spine, one could not appreciate it.    Melinda Schroeder returns today.  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  She is having a great deal of pain on the right side, which radiates down into the shoulder region.  I think this is from the C5-6 facet arthropathy.  She has received injections and has had time over the last six months without relief.  The nerve sheath tumor has grown because two years ago we could not see it, now we can.  She would like to have the tumor removed and the pain in the neck relieved.      Physical Exam  Constitutional: She is oriented to person, place, and time. She appears well-developed and well-nourished.  HENT:  Head: Normocephalic and atraumatic.  Eyes: Conjunctivae and EOM are normal. Pupils are equal, round, and reactive to light.  Neck: Normal range of motion.  Neck supple.  Cardiovascular: Normal rate, regular rhythm, normal heart sounds and intact distal pulses.   Pulmonary/Chest: Effort normal and breath sounds normal.  Abdominal: Bowel sounds are normal.  Musculoskeletal: Normal range of motion.  Neurological: She is alert and oriented to person, place, and time. She has normal reflexes. She displays normal reflexes. No cranial nerve deficit. She exhibits normal muscle tone. Coordination normal.  Skin: Skin is warm and dry. She is not diaphoretic.  Psychiatric: She has a normal mood and affect. Her behavior is normal. Judgment and thought content normal.  Vitals reviewed.   IMPRESSION/PLAN:                             I think a posterior fusion at C5-6 on the right side would be indicated as I do believe the facet arthropathy and the edema seen in the bone from an MRI in February confirms this.  The injections, again, have not helped her and physical therapy is not going to help the inflamed joint either, and the idea of the nerve sheath tumor and the fact that it could get larger disturbs her to no end.  I do not believe she is symptomatic yet, but the tumor is growing in the spinal region, this I do believe it should be removed.  Both of these can be done via posterior cervical laminectomies.  At 1-2 she will not need a fusion.  I think she will likely have a numb area in the back of the head.  At 5-6 I believe she can undergo a cervical fusion just on the right side with lateral mass screws.  She does not need a decompression there.

## 2016-02-11 NOTE — Op Note (Signed)
02/11/2016  3:22 PM  PATIENT:  Melinda Schroeder  75 y.o. 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    PROCEDURE:  Procedure(s): Left c2 nerve sheath Tumor resection via left C1, C2 laminectomy, Microscopic dissection C5-6 Posterior cervical fusion with lateral mass fixation(medtronic), non segmental Allograft morsels  SURGEON: Surgeon(s): Ashok Pall, MD Kary Kos, MD  ASSISTANTS:Cram, Dominica Severin  ANESTHESIA:   general  EBL:  Total I/O In: 2100 [I.V.:2100] Out: 1800 [Urine:1700; Blood:100]  BLOOD ADMINISTERED:none  CELL SAVER GIVEN:none  COUNT:per nursing  DRAINS: none   SPECIMEN:  Source of Specimen:  Left C2 nerve root  DICTATION: Melinda Schroeder was taken to the operating room, intubated, and placed under a general anesthetic without difficulty. female I placed her head in a [place] head holder once adequate anesthesia was obtained. A foley catheter was placed under sterile conditions. Melinda Schroeder was positioned prone on the operating table with all pressure points properly padded. I shaved, prepped, and draped her head in a sterile manner. I opened over the C5, C6 spinous processes. I used an intraoperative fluoroscopy to localize. Once at the correct level I exposed the facets at C5, and C6 bilaterally. I using fluoroscopy drilled the holes for the lateral mass screws at C5, and C6 bilaterally. I performed a partial facetectomy of C5/6 on the right side, then placed my screws. I placed 4 screws two at C5, and two more at C6. I decorticated the lateral masses and facets and lateral lamina, then placed allograft morsels. I connected the rods with Dr. Windy Carina assistance and placed locking caps. Final films showed the hardware to be in good position.  I closed that wound with vicryl sutures approximating the deep cervical fascia,  subcutaneous, and subcuticular layers. I placed liquiban on the wound for a sterile dressing.  I opened the second incision to expose the C1, and C2 lamina. I used monopolar cautery to expose the posterior archof C1, and the C2 lamina.With the tumor on the left side I restricted the laminectomies to the left side at C1, and C2. I exposed a large spherical mass just lateral to the thecal sac. With microscopic dissection Dr. Saintclair Halsted and I were able to free the mass from surrounding attachments. We could not dissect it so that it was distinct from the C2 root. Thus we tied suture ties  proximal to the mass and distal to the mass, then cut the tumor to remove it en bloc. We did open the tumor to see if we could dissect it out from other rootlets, but that was not possible. We controlled bleeding with gelfoam, and left the resection bed dry.  I closed the wound in the same fashion as the previously described incision. Finally I placed an occlusive bandage over both incisions.  I disconnected the head holder from the bed, we rolled her onto the stretcher, and I removed the head holder. With the initial opening I did open what was obviously very thin dura and there was csf emanating from the hole. I placed a piece of duragen on the hole then used duraseal to augment the closure. We tied   PLAN OF CARE: Admit to inpatient   PATIENT DISPOSITION:  PACU - hemodynamically stable.   Delay start of Pharmacological VTE agent (>24hrs) due to surgical blood loss or risk of bleeding:  yes

## 2016-02-11 NOTE — Transfer of Care (Signed)
Immediate Anesthesia Transfer of Care Note  Patient: Melinda Schroeder  Procedure(s) Performed: Procedure(s) with comments: Left C1-2 Tumor resection/C5-6 Posterior cervical fusion with lateral mass fixation (Left) - Left C1-2 Tumor resection/C5-6 Posterior cervical fusion with lateral mass fixation   Patient Location: PACU  Anesthesia Type:General  Level of Consciousness: awake and oriented  Airway & Oxygen Therapy: Patient Spontanous Breathing and Patient connected to nasal cannula oxygen  Post-op Assessment: Report given to RN, Post -op Vital signs reviewed and stable, Patient moving all extremities X 4 and Patient able to stick tongue midline  Post vital signs: Reviewed and stable  Last Vitals:  Filed Vitals:   02/11/16 0734  BP: 164/74  Pulse: 69  Temp: 36.9 C  Resp: 20    Last Pain:  Filed Vitals:   02/11/16 0818  PainSc: 7       Patients Stated Pain Goal: 3 ([id number])  Complications: No apparent anesthesia complications

## 2016-02-11 NOTE — Anesthesia Preprocedure Evaluation (Addendum)
Anesthesia Evaluation  Patient identified by MRN, date of birth, ID band Patient awake    Reviewed: Allergy & Precautions, NPO status , Patient's Chart, lab work & pertinent test results  History of Anesthesia Complications (+) PONV  Airway Mallampati: II   Neck ROM: full    Dental  (+) Edentulous Upper, Edentulous Lower, Dental Advisory Given   Pulmonary shortness of breath, COPD, Current Smoker,    breath sounds clear to auscultation       Cardiovascular hypertension, + CAD and + Cardiac Stents   Rhythm:regular Rate:Normal     Neuro/Psych Anxiety    GI/Hepatic GERD  ,  Endo/Other    Renal/GU      Musculoskeletal  (+) Arthritis ,   Abdominal   Peds  Hematology   Anesthesia Other Findings   Reproductive/Obstetrics                            Anesthesia Physical Anesthesia Plan  ASA: III  Anesthesia Plan: General   Post-op Pain Management:    Induction: Intravenous  Airway Management Planned: Oral ETT  Additional Equipment: Arterial line  Intra-op Plan:   Post-operative Plan: Extubation in OR  Informed Consent: I have reviewed the patients History and Physical, chart, labs and discussed the procedure including the risks, benefits and alternatives for the proposed anesthesia with the patient or authorized representative who has indicated his/her understanding and acceptance.     Plan Discussed with: CRNA, Anesthesiologist and Surgeon  Anesthesia Plan Comments:         Anesthesia Quick Evaluation

## 2016-02-11 NOTE — Progress Notes (Signed)
Patient admitted from PACU. Patient alert and oriented x 4. Patient oriented to the room and was made comfortable. Will continue to monitor.

## 2016-02-12 ENCOUNTER — Encounter (HOSPITAL_COMMUNITY): Payer: Self-pay | Admitting: Neurosurgery

## 2016-02-12 DIAGNOSIS — A419 Sepsis, unspecified organism: Secondary | ICD-10-CM

## 2016-02-12 HISTORY — DX: Sepsis, unspecified organism: A41.9

## 2016-02-12 MED ORDER — KETOROLAC TROMETHAMINE 30 MG/ML IJ SOLN
30.0000 mg | Freq: Four times a day (QID) | INTRAMUSCULAR | Status: DC
  Administered 2016-02-12 – 2016-02-13 (×4): 30 mg via INTRAVENOUS
  Filled 2016-02-12 (×4): qty 1

## 2016-02-12 NOTE — Anesthesia Postprocedure Evaluation (Signed)
Anesthesia Post Note  Patient: Melinda Schroeder  Procedure(s) Performed: Procedure(s) (LRB): Left C1-2 Tumor resection/C5-6 Posterior cervical fusion with lateral mass fixation (Left)  Patient location during evaluation: PACU Anesthesia Type: General Level of consciousness: awake and alert Pain management: pain level controlled Vital Signs Assessment: post-procedure vital signs reviewed and stable Respiratory status: spontaneous breathing, nonlabored ventilation, respiratory function stable and patient connected to nasal cannula oxygen Cardiovascular status: blood pressure returned to baseline and stable Postop Assessment: no signs of nausea or vomiting Anesthetic complications: no    Last Vitals:  Filed Vitals:   02/12/16 0134 02/12/16 0637  BP: 171/75 180/63  Pulse: 81 80  Temp: 36.7 C 36.8 C  Resp: 16 16    Last Pain:  Filed Vitals:   02/12/16 0801  PainSc: 8                  Kwanza Cancelliere S

## 2016-02-12 NOTE — Care Management Note (Signed)
Case Management Note  Patient Details  Name: Melinda Schroeder MRN: [id number] Date of Birth: 1941/03/31  Subjective/Objective:    Pt admitted and underwent: Left C1-2 Tumor resection/C5-6 Posterior cervical fusion with lateral mass fixation. She is from home with spouse.   Action/Plan: CM following for d/c needs.   Expected Discharge Date:                  Expected Discharge Plan:     In-House Referral:     Discharge planning Services     Post Acute Care Choice:    Choice offered to:     DME Arranged:    DME Agency:     HH Arranged:    HH Agency:     Status of Service:  In process, will continue to follow  Medicare Important Message Given:    Date Medicare IM Given:    Medicare IM give by:    Date Additional Medicare IM Given:    Additional Medicare Important Message give by:     If discussed at [place] of Stay Meetings, dates discussed:    Additional Comments:  Pollie Friar, RN 02/12/2016, 3:15 PM

## 2016-02-13 MED ORDER — BISACODYL 10 MG RE SUPP
10.0000 mg | Freq: Every day | RECTAL | Status: DC | PRN
  Administered 2016-02-13: 10 mg via RECTAL
  Filled 2016-02-13: qty 1

## 2016-02-13 MED ORDER — PROCHLORPERAZINE 25 MG RE SUPP
25.0000 mg | Freq: Two times a day (BID) | RECTAL | Status: DC | PRN
  Administered 2016-02-13: 25 mg via RECTAL
  Filled 2016-02-13 (×3): qty 1

## 2016-02-13 NOTE — Progress Notes (Signed)
Patient up in the chair for breakfast.

## 2016-02-13 NOTE — Progress Notes (Signed)
Patient ID: Melinda Schroeder, female   DOB: 12-21-1940, 75 y.o.   MRN: [id number] BP 167/68 mmHg  Pulse 103  Temp(Src) 99.5 F (37.5 C) (Oral)  Resp 16  Ht 5' (1.524 m)  Wt 50.803 kg (112 lb)  BMI 21.87 kg/m2  SpO2 94% Alert and oriented x 4 Extraordinarily slow progress, states she is nauseated and in a great deal of pain in her neck. Wound is flat, clean, and dry Tolerates sitting up, no evidence for a csf leak.  Changed to compazine, and dulcolax suppository.  Moving all extremities well.

## 2016-02-13 NOTE — Evaluation (Signed)
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  She is now s/p Left c2 nerve sheath Tumor resection via left C1, C2 laminectomy and C5-6 Posterior cervical fusion with lateral mass fixation.  Clinical Impression  Patient presents with decreased mobility due to deficits listed in PT problem list.  She may needs SNF placement if symptoms don't improve enough to allow her to assist more with mobility.  She will benefit from skilled PT in the acute setting to allow improved mobility and hopeful return home versus to post acute rehab setting.     Follow Up Recommendations SNF    Equipment Recommendations  Rolling walker with 5" wheels    Recommendations for Other Services       Precautions / Restrictions Precautions Precautions: Fall;Cervical      Mobility  Bed Mobility Overal bed mobility: Needs Assistance Bed Mobility: Rolling;Sidelying to Sit;Sit to Sidelying Rolling: Max assist Sidelying to sit: Min assist     Sit to sidelying: Mod assist General bed mobility comments: HOB flat and pt lying on L side when I entered room; RN reports just up to chair and back to bed again wtih nursing assist and she gave IV medications; patient refusing to move, but I attempted to help her sit up, she resisted and pushed back then rolled back on her back; I attemtped to lift her head to reposition on pillow and she moaned in pain and pulled began heaving, then sat herself up to EOB and  was supported during vomiting episode.  Transfers Overall transfer level: Needs assistance Equipment used:  Rolling walker (2 wheeled) Transfers: Sit to/from Stand Sit to Stand: Mod assist         General transfer comment: attempted to have pt participate in scooting forward, but would not, then complained when I helped her, assist for anterior weight shift and she accepeted weight to stand pulling up on walker  Ambulation/Gait Ambulation/Gait assistance: Min assist;Mod assist Ambulation Distance (Feet): 6 Feet Assistive device: Rolling walker (2 wheeled) Gait Pattern/deviations: Step-to pattern;Decreased stride length;Shuffle     General Gait Details: hesitant stating she could not see where she was going, I guided her to take steps toward door, then assisted to turn walker and go back to the bed  Stairs            Wheelchair Mobility    Modified Rankin (Stroke Patients Only)       Balance Overall balance assessment: Needs assistance Sitting-balance support: Feet unsupported;No upper extremity supported Sitting balance-Leahy Scale: Fair Sitting balance - Comments: assist given for balance while vomiting, then able to sit unsupported   Standing balance support: Bilateral upper extremity supported Standing balance-Leahy Scale: Poor Standing balance comment: UE support and min A for balance                             Pertinent Vitals/Pain Pain Assessment: Faces Pain Score: 10-Worst pain ever Pain Location: head and neck with any pressure to help her move her head or to roll or sit up  Pain Descriptors / Indicators: Headache;Moaning;Operative site guarding Pain Intervention(s): Monitored during session;Repositioned;RN gave pain meds during session;Limited activity within patient's tolerance    Home Living Family/patient expects to be discharged to:: Private residence Living Arrangements: Spouse/significant other Available Help at Discharge: Family Type of Home: House Home Access: Stairs to enter   [employer] of Steps: would not state how  many Home Layout: One level Home Equipment: None      Prior Function Level of Independence: Independent         Comments: reports was independent at home      Hand Dominance        Extremity/Trunk Assessment   Upper Extremity Assessment: RUE deficits/detail;LUE deficits/detail RUE Deficits / Details: reaches to pull up on rail and reposition only when vomiting, otherwise makes no active movements to move herself and states she can't RUE: Unable to fully assess due to pain   LUE Deficits / Details: reaches to pull up on rail and reposition only when vomiting, otherwise makes no active movements to move herself and states she can't   Lower Extremity Assessment: Overall WFL for tasks assessed         Communication      Cognition Arousal/Alertness: Awake/alert Behavior During Therapy: Anxious Overall Cognitive Status: No family/caregiver present to determine baseline cognitive functioning Area of Impairment: Following commands       Following Commands: Follows one step commands inconsistently;Follows one step commands with increased time       General Comments: only halfway reponds to question even when questions repeated    General Comments General comments (skin integrity, edema, etc.): RN informed of concerns for dural leak as possible cause of symptoms; RN reports had similar response after last surgery per pt's daughter    Exercises        Assessment/Plan    PT Assessment Patient needs continued PT services  PT Diagnosis Difficulty walking;Acute pain;Generalized weakness   PT Problem List Decreased strength;Decreased activity tolerance;Decreased balance;Pain;Decreased mobility;Decreased knowledge of use of DME  PT Treatment Interventions DME instruction;Balance training;Gait training;Stair training;Functional mobility training;Patient/family education;Therapeutic activities;Therapeutic exercise   PT Goals (Current goals can be found in the Care Plan  section) Acute Rehab PT Goals Patient Stated Goal: to help pain/nausea PT Goal Formulation: With patient Time For Goal Achievement: 02/20/16 Potential to Achieve Goals: Fair    Frequency Min 6X/week   Barriers to discharge        Co-evaluation               End of Session   Activity Tolerance: Patient limited by pain;Treatment limited secondary to medical complications (Comment) (nausea and vomiting) Patient left: in bed;with call bell/phone within reach Nurse Communication: Other (comment) (concern for possible dural leak)         Time: 1110-1145 PT Time Calculation (min) (ACUTE ONLY): 35 min   Charges:   PT Evaluation $PT Eval High Complexity: 1 Procedure PT Treatments $Gait Training: 8-22 mins   PT G CodesReginia Naas 2016-02-17, 12:11 PM  Magda Kiel, [place] 02/17/2016

## 2016-02-13 NOTE — Progress Notes (Signed)
Patient educated and encouraged to use incentive spirometer, but patient is non compliant. Complains a lot about being nauseous. Will continue to encourage patient to do more movement instead of just lying in the bed.

## 2016-02-13 NOTE — Progress Notes (Signed)
Patient back in the bed. Refused her breakfast.

## 2016-02-13 NOTE — Care Management Important Message (Signed)
Important Message  Patient Details  Name: Melinda Schroeder MRN: [id number] Date of Birth: [birth year]   Medicare Important Message Given:  Yes    Loann Quill 02/13/2016, 9:56 AM

## 2016-02-13 NOTE — Consult Note (Signed)
   [facility] CM Inpatient Consult   02/13/2016  Melinda Schroeder 1941-03-04 [id number]  Patient screened for potential [place] Management services. Patient is eligible for [place] under her [facility] Medicare. Admitted for surgery, HX of COPD noted. Electronic medical record reveals patient's disposition is likely a skilled nursing facility for rehab. No current [facility] Care Management needs identified at this time.   If patient's post hospital needs change please place a [facility] Care Management consult. For questions please contact:   Janci Minor RN, [facility] Liaison  [phone]) Business Mobile [phone]) Toll free office

## 2016-02-14 NOTE — Progress Notes (Signed)
No acute events.  Slow to ambulate.  Wants to stay for at least one more day. AVSS Awake and alert Moving arms and legs well Incision looks good Stable Encourage ambulation

## 2016-02-14 NOTE — Progress Notes (Signed)
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  She is now s/p Left c2 nerve sheath Tumor resection via left C1, C2 laminectomy and C5-6 Posterior cervical fusion with lateral mass fixation.    PT Comments    Pt making steady progress towards goals. Acute PT to continue, if she continues to progress she may be more appropriate for HHPT, vs SNF.   Follow Up Recommendations  SNF     Equipment Recommendations  Rolling walker with 5" wheels       Precautions / Restrictions Precautions Precautions: Fall;Cervical Restrictions Weight Bearing Restrictions: No    Mobility  Bed Mobility Overal bed mobility: Needs Assistance Bed Mobility: Supine to Sit;Sit to Supine Rolling: Min guard   Supine to sit: Min guard Sit to supine: Min guard   General bed mobility comments: HOB elevated (pt has bed that elevates at home), no rails used. cues for technique and sequencing needed.  Transfers Overall transfer level: Needs assistance Equipment used: Rolling walker (2 wheeled) Transfers: Sit to/from Stand Sit to Stand: Min guard         General transfer comment: cues for hand placement for increased safety with transfers  Ambulation/Gait Ambulation/Gait assistance: Min guard;Supervision Ambulation Distance (Feet): 100 Feet Assistive device: Rolling walker (2 wheeled) Gait Pattern/deviations: Step-through pattern;Decreased stride length Gait velocity: decreased Gait velocity interpretation: <1.8 ft/sec, indicative of risk for recurrent falls General Gait Details: slow, steady pace  with no balance issues noted       Cognition Arousal/Alertness: Awake/alert Behavior During Therapy: WFL for tasks assessed/performed Overall Cognitive Status: Within Functional Limits for tasks assessed                       Pertinent Vitals/Pain Pain Assessment: 0-10 Pain Score: 6  Pain Location: neck Pain Descriptors / Indicators: Aching;Sore Pain Intervention(s): Limited activity within patient's tolerance;RN gave pain meds during session;Monitored during session;Repositioned;Patient requesting pain meds-RN notified     PT Goals (current goals can now be found in the care plan section) Acute Rehab PT Goals Patient Stated Goal: to help pain/nausea PT Goal Formulation: With patient Time For Goal Achievement: 02/20/16 Potential to Achieve Goals: Fair Progress towards PT goals: Progressing toward goals    Frequency  Min 6X/week    PT Plan Current plan remains appropriate    End of Session Equipment Utilized During Treatment: Gait belt Activity Tolerance: Patient tolerated treatment well Patient left: in bed;with call bell/phone within reach;with bed alarm set     Time: 1352-1415 PT Time Calculation (min) (ACUTE ONLY): 23 min  Charges:  $Gait Training: 8-22 mins $Therapeutic Activity: 8-22 mins           Willow Ora 02/14/2016, 2:23 PM   Willow Ora, PTA, [phone] 02/14/2016, 2:23 PM

## 2016-02-15 NOTE — Progress Notes (Signed)
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  She is now s/p Left c2 nerve sheath Tumor resection via left C1, C2 laminectomy and C5-6 Posterior cervical fusion with lateral mass fixation.    PT Comments    Pt sleeping soundly on arrival to room. Once pt was awoken, she was agreeable to PT. Continued to remain sleepy during session. Not safe to attempt stairs today due to pt being sleepy/lethargic. Will attempt stairs at next session if pt more alert.    Follow Up Recommendations  SNF     Equipment Recommendations  Rolling walker with 5" wheels    Precautions / Restrictions Precautions Precautions: Fall;Cervical Restrictions Weight Bearing Restrictions: No    Mobility  Bed Mobility         Supine to sit: Supervision     General bed mobility comments: Bed flat and no rails used. increased time needed to get to edge of bed.  Transfers Overall transfer level: Needs assistance Equipment used: Rolling walker (2 wheeled) Transfers: Sit to/from Stand Sit to Stand: Supervision         General transfer comment: cues on hand placement for safetey.  Ambulation/Gait Ambulation/Gait assistance: Supervision;Min assist Ambulation Distance (Feet): 200 Feet Assistive device: Rolling walker (2 wheeled) Gait Pattern/deviations: Step-through pattern;Decreased stride length Gait velocity: decreased Gait velocity interpretation: Below normal speed for age/gender General Gait Details: slow, steady pace with no balance issues noted. occasional assist needed to negotiate RW around  barriers/objects as pt bumped into them at least 2 times.       Balance             Standing balance-Leahy Scale: Good Standing balance comment: pt stood at sink/mirror to brush her hair before hall ambulation with out UE support with min guard assist. no balance issues noted.           Cognition Arousal/Alertness: Awake/alert Behavior During Therapy: WFL for tasks assessed/performed Overall Cognitive Status: Within Functional Limits for tasks assessed            Pertinent Vitals/Pain Pain Assessment: 0-10 Pain Score: 7  Pain Location: neck Pain Descriptors / Indicators: Aching;Sore Pain Intervention(s): Limited activity within patient's tolerance;Monitored during session;Premedicated before session;Repositioned     PT Goals (current goals can now be found in the care plan section) Acute Rehab PT Goals Patient Stated Goal: to help pain/nausea PT Goal Formulation: With patient Time For Goal Achievement: 02/20/16 Potential to Achieve Goals: Fair Progress towards PT goals: Progressing toward goals    Frequency  Min 6X/week    PT Plan Current plan remains appropriate    End of Session Equipment Utilized During Treatment: Gait belt Activity Tolerance: Patient tolerated treatment well Patient left: in chair;with call bell/phone within reach;with chair alarm set     Time: [id number] PT Time Calculation (min) (ACUTE ONLY): 24 min  Charges:  $Gait Training: 8-22 mins $Therapeutic Activity: 8-22 mins           Willow Ora 02/15/2016, 12:27 PM   Willow Ora, PTA, [phone] 02/15/2016, 12:28 PM

## 2016-02-15 NOTE — Clinical Social Work Placement (Signed)
   CLINICAL SOCIAL WORK PLACEMENT  NOTE  Date:  02/15/2016  Patient Details  Name: Melinda Schroeder MRN: [id number] Date of Birth: 09-23-1940  Clinical Social Work is seeking post-discharge placement for this patient at the [place] level of care (*CSW will initial, date and re-position this form in  chart as items are completed):  Yes   Patient/family provided with [place] Work Department's list of facilities offering this level of care within the geographic area requested by the patient (or if unable, by the patient's family).  Yes   Patient/family informed of their freedom to choose among providers that offer the needed level of care, that participate in Medicare, Medicaid or managed care program needed by the patient, have an available bed and are willing to accept the patient.  Yes   Patient/family informed of [facility]'s ownership interest in [facility] and [facility], as well as of the fact that they are under no obligation to receive care at these facilities.  PASRR submitted to EDS on 02/15/16     PASRR number received on 02/15/16     Existing PASRR number confirmed on       FL2 transmitted to all facilities in geographic area requested by pt/family on       FL2 transmitted to all facilities within larger geographic area on       Patient informed that his/her managed care company has contracts with or will negotiate with certain facilities, including the following:            Patient/family informed of bed offers received.  Patient chooses bed at       Physician recommends and patient chooses bed at      Patient to be transferred to   on  .  Patient to be transferred to facility by       Patient family notified on   of transfer.  Name of family member notified:        PHYSICIAN Please sign FL2     Additional Comment:    _______________________________________________ Rozell Searing, LCSW 02/15/2016, 2:16 PM

## 2016-02-15 NOTE — Progress Notes (Signed)
Patient ID: Melinda Schroeder, female   DOB: 12-13-1940, 75 y.o.   MRN: [id number] Vital signs are stable Patient is moving about slowly Require significant help Husband has limited functional opacity We'll continue to monitor in hospital and encourage mobilization

## 2016-02-15 NOTE — Clinical Social Work Note (Signed)
[place] MUST PASARR obtained: [id number] Rozell Searing, MSW, LCSWA [phone] 02/15/2016 2:16 PM

## 2016-02-15 NOTE — Clinical Social Work Note (Signed)
Clinical Social Work Assessment  Patient Details  Name: Melinda Schroeder MRN: [id number] Date of Birth: 1941-02-17  Date of referral:  02/15/16               Reason for consult:  Facility Placement, Discharge Planning                Permission sought to share information with:  Family Supports, Customer service manager, Case Optician, dispensing granted to share information::  Yes, Verbal Permission Granted  Name::      Melinda Schroeder )  Agency::   (SNF's )  Relationship::   (Spouse )  Contact Information:   [phone])  Housing/Transportation Living arrangements for the past 2 months:  Single Family Home Source of Information:  Patient Patient Interpreter Needed:  None Criminal Activity/Legal Involvement Pertinent to Current Situation/Hospitalization:  No - Comment as needed Significant Relationships:  Adult Children, Spouse Lives with:  Spouse Do you feel safe going back to the place where you live?  No Need for family participation in patient care:  Yes (Comment)  Care giving concerns:  PT recommending short-term rehab placement.  Social Worker assessment / plan:  Holiday representative met with patient at bedside in reference to SNF placement. CSW introduced CSW role and SNF process. Pt stated she was not knowledgeable of PT recommendation. Pt falling asleep during assessment and asked that I leave SNF list at bedside. CSW reviewed and provided SNF list. No further concerns reported at this time. CSW remains available as needed.   Employment status:  Retired Nurse, adult PT Recommendations:  [place] / Referral to community resources:  [place]  Patient/Family's Response to care: Pt oriented x4 however falling asleep during assessment. Pt did not fully state if she was agreeable to SNF placement.   Patient/Family's Understanding of and Emotional Response to Diagnosis, Current Treatment, and Prognosis:  Unclear how much patient understands in regards to SNF placement and medical interventions.   Emotional Assessment Appearance:  Appears stated age Attitude/Demeanor/Rapport:   (Flat ) Affect (typically observed):  Pleasant, Flat, Calm Orientation:  Oriented to Situation, Oriented to  Time, Oriented to Place, Oriented to Self Alcohol / Substance use:  Not Applicable Psych involvement (Current and /or in the community):  No (Comment)  Discharge Needs  Concerns to be addressed:  Care Coordination Readmission within the last 30 days:  No Current discharge risk:  Dependent with Mobility Barriers to Discharge:  Continued Medical Work up   [employer], MSW, LCSWA [phone] 02/15/2016 2:09 PM

## 2016-02-16 NOTE — Clinical Social Work Note (Signed)
CSW spoke with patient who states she wants to go home with home health.  PT is recommending home health as well, CSW to sign off please reconsult if other social work needs arise.  Jones Broom. [place], MSW, [place] 02/16/2016 2:05 PM

## 2016-02-16 NOTE — Progress Notes (Signed)
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  She is now s/p Left c2 nerve sheath Tumor resection via left C1, C2 laminectomy and C5-6 Posterior cervical fusion with lateral mass fixation.    PT Comments    Rn reports pt's daughter-in-law will now be able to provide 24/7 supervision as pt's spouse now with back fracture and she will be staying at home with the two of them. Pt functioning at a supervision level and pt reports daughter-in-law will cook, clean and do laundry. Pt aware she will not be able to assist spouse and spouse won't be able to assist her. With dtr in law being able to provide 24/7 assist pt safe to d/c home and received HHPT once medically stable. Pt would like a soft collar for pressure relief as pt's pain increases with ambulation reporting "my neck hurts so bad trying to hold my head up."  Follow Up Recommendations  Home health PT;Supervision/Assistance - 24 hour     Equipment Recommendations  Rolling walker with 5" wheels    Recommendations for Other Services       Precautions / Restrictions Precautions Precautions: Fall;Cervical Precaution Comments: educated on precautions, pt with recall of 1/3 Restrictions Weight Bearing Restrictions: No    Mobility  Bed Mobility Overal bed mobility: Needs Assistance Bed Mobility: Supine to Sit Rolling: Supervision   Supine to sit: Supervision     General bed mobility comments: v/c's to adhere to cervical precaution  Transfers Overall transfer level: Needs assistance Equipment used: Rolling walker (2  wheeled) Transfers: Sit to/from Stand Sit to Stand: Supervision         General transfer comment: v/c's for safe hand placement  Ambulation/Gait Ambulation/Gait assistance: Supervision Ambulation Distance (Feet): 200 Feet Assistive device: Rolling walker (2 wheeled) Gait Pattern/deviations: Step-through pattern;Decreased stride length Gait velocity: dec Gait velocity interpretation: Below normal speed for age/gender General Gait Details: slow and steady, short steps with decreased step height   Stairs Stairs: Yes Stairs assistance: Min assist Stair Management: One rail Right;Step to pattern Number of Stairs: 2 General stair comments: v/c's for sequencing, incraseed time  Wheelchair Mobility    Modified Rankin (Stroke Patients Only)       Balance                                    Cognition Arousal/Alertness: Awake/alert Behavior During Therapy: WFL for tasks assessed/performed Overall Cognitive Status: Within Functional Limits for tasks assessed                      Exercises      General Comments        Pertinent Vitals/Pain Pain Assessment: 0-10 Pain Score: 9  Pain Location: neck Pain Descriptors / Indicators: Sore Pain Intervention(s): Limited activity within patient's tolerance    Home Living                      Prior Function  PT Goals (current goals can now be found in the care plan section) Acute Rehab PT Goals Patient Stated Goal: stop the pain Progress towards PT goals: Progressing toward goals    Frequency  Min 5X/week    PT Plan Discharge plan needs to be updated;Frequency needs to be updated    Co-evaluation             End of Session Equipment Utilized During Treatment: Gait belt Activity Tolerance: Patient tolerated treatment well Patient left: in chair;with call bell/phone within reach;with chair alarm set     Time: [id number] PT Time Calculation (min) (ACUTE ONLY): 24  min  Charges:  $Gait Training: 23-37 mins                    G Codes:      Kingsley Callander 02/16/2016, 1:56 PM   Kittie Plater, PT, DPT Pager #: [phone] Office #: [phone]

## 2016-02-16 NOTE — Progress Notes (Signed)
Patient ID: Melinda Schroeder, female   DOB: 1941/06/21, 75 y.o.   MRN: [id number] BP 179/75 mmHg  Pulse 79  Temp(Src) 98 F (36.7 C) (Oral)  Resp 20  Ht 5' (1.524 m)  Wt 50.803 kg (112 lb)  BMI 21.87 kg/m2  SpO2 98% Alert and oriented, much better since last week Would like to go home tomorrow Moving all extremities well Will order a soft cervical collar.

## 2016-02-16 NOTE — Care Management Important Message (Signed)
Important Message  Patient Details  Name: Melinda Schroeder MRN: [id number] Date of Birth: 12/20/1940   Medicare Important Message Given:  Yes    Loann Quill 02/16/2016, 11:58 AM

## 2016-02-16 NOTE — Consult Note (Addendum)
   [facility] CM Inpatient Consult   02/16/2016  [name] [birth year] [id number]     [facility] Care Management follow up. Chart reviewed. Went to bedside to speak with [name] to discuss and offer [facility] services. However, she was sleeping soundly. Did not want to disturb. Will come back at later time. Family not at bedside. Spoke with patient's nurse.  Marthenia Rolling, MSN-Ed, RN,BSN [facility] Liaison [phone]

## 2016-02-17 LAB — GLUCOSE, CAPILLARY: GLUCOSE-CAPILLARY: 103 mg/dL — AB (ref 65–99)

## 2016-02-17 MED ORDER — OXYCODONE-ACETAMINOPHEN 5-325 MG PO TABS: 1.0000 | ORAL_TABLET | Freq: Four times a day (QID) | ORAL | Status: DC | PRN

## 2016-02-17 MED ORDER — TIZANIDINE HCL 4 MG PO TABS: 4.0000 mg | ORAL_TABLET | Freq: Four times a day (QID) | ORAL | Status: DC | PRN

## 2016-02-17 NOTE — Care Management Note (Signed)
Case Management Note  Patient Details  Name: ARYAH ENDERBY MRN: [id number] Date of Birth: 03/02/1941  Subjective/Objective:                    Action/Plan: Plan is home with [facility] services. CM following for d/c needs.   Expected Discharge Date:                  Expected Discharge Plan:     In-House Referral:     Discharge planning Services     Post Acute Care Choice:    Choice offered to:     DME Arranged:    DME Agency:     HH Arranged:    HH Agency:     Status of Service:  In process, will continue to follow  Medicare Important Message Given:  Yes Date Medicare IM Given:    Medicare IM give by:    Date Additional Medicare IM Given:    Additional Medicare Important Message give by:     If discussed at [place] of Stay Meetings, dates discussed:    Additional Comments:  Pollie Friar, RN 02/17/2016, 4:25 PM

## 2016-02-17 NOTE — Discharge Summary (Signed)
  Physician Discharge Summary  Patient ID: Melinda Schroeder MRN: [id number] DOB/AGE: [birth year] 74 y.o.  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 was taken to the operating room for an uncomplicated cervical posterior fusion at C5/6, and a C2 nerve root tumor which was identified as a schwannoma. Post op she has had dealt with surgical pain in the posterior neck. Neurologically she is normal, her wound is clean, dry, and without signs of infection. At discharge she is voiding, ambulating, and tolerating a regular diet.   Treatments: surgery: Left c2 nerve sheath Tumor resection via left C1, C2 laminectomy, Microscopic dissection C5-6 Posterior cervical fusion with lateral mass fixation(medtronic), non segmental Allograft morsels   Discharge Exam: Blood pressure 143/64, pulse 66, temperature 98.3 F (36.8 C), temperature source Oral, resp. rate 20, height 5' (1.524 m), weight 50.803 kg (112 lb), SpO2 91 %. General appearance: alert, cooperative, appears stated age and mild distress Neurologic: Alert and oriented X 3, normal strength and tone. Normal symmetric reflexes. Normal coordination and gait  Disposition: 01-Home or Self Care Neoplasm of nerve sheath    Medication List    ASK your doctor about these medications        HYDROcodone-acetaminophen 10-325 MG tablet  Commonly known as:  NORCO  Take 1 tablet by mouth every 6 (six) hours as needed (Pain).     LORazepam 0.5 MG tablet  Commonly known as:  ATIVAN  Take 0.5 mg by mouth every 6 (six) hours as needed for anxiety.     losartan-hydrochlorothiazide 100-12.5 MG tablet  Commonly known as:  HYZAAR  Take 1 tablet by mouth every morning.     metoprolol succinate 100 MG 24 hr tablet   Commonly known as:  TOPROL-XL  Take 100 mg by mouth 2 (two) times daily. Take with or immediately following a meal.     mirtazapine 15 MG tablet  Commonly known as:  REMERON  Take 15 mg by mouth at bedtime.     nitroGLYCERIN 0.4 MG SL tablet  Commonly known as:  NITROSTAT  Place 0.4 mg under the tongue every 5 (five) minutes as needed for chest pain.     omeprazole 20 MG capsule  Commonly known as:  PRILOSEC  Take 20 mg by mouth 2 (two) times daily before a meal.     PARoxetine 20 MG tablet  Commonly known as:  PAXIL  Take 20 mg by mouth every morning.     potassium chloride 10 MEQ tablet  Commonly known as:  K-DUR,KLOR-CON  Take 10 mEq by mouth 2 (two) times daily.     simvastatin 80 MG tablet  Commonly known as:  ZOCOR  Take 80 mg by mouth at bedtime.     tiotropium 18 MCG inhalation capsule  Commonly known as:  SPIRIVA  Place 18 mcg into inhaler and inhale daily as needed (Shortness of breath).           Follow-up Information    Follow up with Leeah Politano L, MD In 3 weeks.   Specialty:  Neurosurgery   Why:  please call the office to make an appointment   Contact information:   [address] [place] [address] [place] [address] [phone]       Signed: Winfield Cunas 02/17/2016, 5:40 PM

## 2016-02-17 NOTE — Progress Notes (Signed)
..  Discharge orders received, Pt for discharge home today with home health PT per [place]. IV d/c'd. D/c instructions and RX given with verbalized understanding. Family at bedside to assist patient with discharge. Staff bought pt downstairs via wheelchair.

## 2016-02-17 NOTE — Progress Notes (Signed)
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  She is now s/p Left c2 nerve sheath Tumor resection via left C1, C2 laminectomy and C5-6 Posterior cervical fusion with lateral mass fixation.    PT Comments    Pt with improved mobility but con't to struggle with transferring in/out of bed while adhering to cervical precautions. Pt with improved ambulation tolerance this date and demo'd safe tolieting and hand hygiene. Pt safe for d/c home with 24/7 assist once medically stable.  Follow Up Recommendations  Home health PT;Supervision/Assistance - 24 hour     Equipment Recommendations  Rolling walker with 5" wheels    Recommendations for Other Services       Precautions / Restrictions Precautions Precautions: Fall;Cervical Precaution Comments: pt unable to recall precautions, pt re-educated Restrictions Weight Bearing Restrictions: No    Mobility  Bed Mobility Overal bed mobility: Needs Assistance Bed Mobility: Rolling;Sidelying to Sit Rolling: Supervision Sidelying to sit: Min assist       General bed mobility comments: pt unable to transfer from sidelying to sit without assist  Transfers Overall transfer level: Needs assistance Equipment used: Rolling walker (2 wheeled) Transfers: Sit to/from Stand Sit to Stand: Supervision         General transfer comment: pt with safe technique  Ambulation/Gait Ambulation/Gait assistance: Supervision Ambulation Distance (Feet): 300 Feet Assistive device: Rolling walker (2 wheeled) Gait Pattern/deviations: Step-through  pattern;Trunk flexed;Narrow base of support Gait velocity: dec Gait velocity interpretation: Below normal speed for age/gender General Gait Details: pt able to amb short distances without RW safely but requires RW for long distance ambulation for energy conservation   Stairs Stairs: Yes Stairs assistance: Min assist Stair Management: One rail Right Number of Stairs: 2 General stair comments: increased time  Wheelchair Mobility    Modified Rankin (Stroke Patients Only)       Balance                                    Cognition Arousal/Alertness: Awake/alert Behavior During Therapy: WFL for tasks assessed/performed Overall Cognitive Status: Within Functional Limits for tasks assessed                      Exercises      General Comments General comments (skin integrity, edema, etc.): pt tolieted self with supervision and was able to stand at sink and perform hand hygiene without difficulty or LOB      Pertinent Vitals/Pain Pain Assessment: 0-10 Pain Score: 5  Pain Location: neck Pain Descriptors / Indicators: Sore Pain Intervention(s): Limited activity within patient's tolerance    Home Living                      Prior Function            PT Goals (current goals can now be found in the care plan section) Progress towards PT goals: Progressing toward goals    Frequency  Min 3X/week    PT Plan Frequency  needs to be updated    Co-evaluation             End of Session Equipment Utilized During Treatment: Gait belt Activity Tolerance: Patient tolerated treatment well Patient left: in bed;with call bell/phone within reach;with bed alarm set     Time: [id number] PT Time Calculation (min) (ACUTE ONLY): 23 min  Charges:  $Gait Training: 8-22 mins $Therapeutic Activity: 8-22 mins                    G Codes:      Kingsley Callander 02/17/2016, 3:16 PM   Kittie Plater, PT, DPT Pager #: [phone] Office #:  [phone]

## 2016-02-18 NOTE — Care Management Note (Signed)
Case Management Note  Patient Details  Name: RAMI FARNUM MRN: [id number] Date of Birth: 12/16/1940  Subjective/Objective:                    Action/Plan: 02/18/2016 @ 10:10am: Pt discharged late yesterday with orders for [facility] PT/OT. CM called Mrs Pethtel this am and went over the list of [facility] agencies in the [facility] area. She selected [place]. Butch Penny with [facility] notified and accepted the referral.   Expected Discharge Date:                  Expected Discharge Plan:  [place]  In-House Referral:     Discharge planning Services  CM Consult  Post Acute Care Choice:    Choice offered to:  Patient  DME Arranged:    DME Agency:     HH Arranged:  PT, OT HH Agency:  [facility]  Status of Service:  Completed, signed off  Medicare Important Message Given:  Yes Date Medicare IM Given:    Medicare IM give by:    Date Additional Medicare IM Given:    Additional Medicare Important Message give by:     If discussed at [place] of Stay Meetings, dates discussed:    Additional Comments:  Pollie Friar, RN 02/18/2016, 10:14 AM

## 2016-02-19 DIAGNOSIS — Z48811 Encounter for surgical aftercare following surgery on the nervous system: Secondary | ICD-10-CM | POA: Diagnosis not present

## 2016-02-20 ENCOUNTER — Inpatient Hospital Stay (HOSPITAL_COMMUNITY): Payer: Commercial Managed Care - HMO

## 2016-02-20 ENCOUNTER — Inpatient Hospital Stay (HOSPITAL_COMMUNITY)
Admission: AD | Admit: 2016-02-20 | Discharge: 2016-02-27 | DRG: 871 | Disposition: A | Discharge status: Home / Self Care | Payer: Commercial Managed Care - HMO | Source: Other Acute Inpatient Hospital | Attending: Internal Medicine | Admitting: Internal Medicine

## 2016-02-20 DIAGNOSIS — F419 Anxiety disorder, unspecified: Secondary | ICD-10-CM | POA: Diagnosis not present

## 2016-02-20 DIAGNOSIS — F17210 Nicotine dependence, cigarettes, uncomplicated: Secondary | ICD-10-CM | POA: Diagnosis present

## 2016-02-20 DIAGNOSIS — R000 Tachycardia, unspecified: Secondary | ICD-10-CM | POA: Diagnosis not present

## 2016-02-20 DIAGNOSIS — Z8673 Personal history of transient ischemic attack (TIA), and cerebral infarction without residual deficits: Secondary | ICD-10-CM

## 2016-02-20 DIAGNOSIS — Z823 Family history of stroke: Secondary | ICD-10-CM

## 2016-02-20 DIAGNOSIS — Z90710 Acquired absence of both cervix and uterus: Secondary | ICD-10-CM | POA: Diagnosis not present

## 2016-02-20 DIAGNOSIS — M4322 Fusion of spine, cervical region: Secondary | ICD-10-CM | POA: Diagnosis not present

## 2016-02-20 DIAGNOSIS — G9340 Encephalopathy, unspecified: Secondary | ICD-10-CM | POA: Diagnosis present

## 2016-02-20 DIAGNOSIS — G9341 Metabolic encephalopathy: Secondary | ICD-10-CM | POA: Diagnosis present

## 2016-02-20 DIAGNOSIS — I639 Cerebral infarction, unspecified: Secondary | ICD-10-CM | POA: Diagnosis not present

## 2016-02-20 DIAGNOSIS — G4700 Insomnia, unspecified: Secondary | ICD-10-CM | POA: Diagnosis not present

## 2016-02-20 DIAGNOSIS — Z9109 Other allergy status, other than to drugs and biological substances: Secondary | ICD-10-CM

## 2016-02-20 DIAGNOSIS — E222 Syndrome of inappropriate secretion of antidiuretic hormone: Secondary | ICD-10-CM | POA: Diagnosis present

## 2016-02-20 DIAGNOSIS — E876 Hypokalemia: Secondary | ICD-10-CM | POA: Diagnosis not present

## 2016-02-20 DIAGNOSIS — J449 Chronic obstructive pulmonary disease, unspecified: Secondary | ICD-10-CM | POA: Diagnosis not present

## 2016-02-20 DIAGNOSIS — Z981 Arthrodesis status: Secondary | ICD-10-CM | POA: Diagnosis not present

## 2016-02-20 DIAGNOSIS — I6501 Occlusion and stenosis of right vertebral artery: Secondary | ICD-10-CM | POA: Diagnosis not present

## 2016-02-20 DIAGNOSIS — R4182 Altered mental status, unspecified: Secondary | ICD-10-CM | POA: Diagnosis not present

## 2016-02-20 DIAGNOSIS — A872 Lymphocytic choriomeningitis: Secondary | ICD-10-CM | POA: Diagnosis not present

## 2016-02-20 DIAGNOSIS — R0682 Tachypnea, not elsewhere classified: Secondary | ICD-10-CM | POA: Diagnosis present

## 2016-02-20 DIAGNOSIS — R443 Hallucinations, unspecified: Secondary | ICD-10-CM | POA: Diagnosis not present

## 2016-02-20 DIAGNOSIS — A419 Sepsis, unspecified organism: Secondary | ICD-10-CM

## 2016-02-20 DIAGNOSIS — F329 Major depressive disorder, single episode, unspecified: Secondary | ICD-10-CM | POA: Diagnosis not present

## 2016-02-20 DIAGNOSIS — F4489 Other dissociative and conversion disorders: Secondary | ICD-10-CM | POA: Diagnosis not present

## 2016-02-20 DIAGNOSIS — R5381 Other malaise: Secondary | ICD-10-CM | POA: Diagnosis present

## 2016-02-20 DIAGNOSIS — R0600 Dyspnea, unspecified: Secondary | ICD-10-CM | POA: Diagnosis not present

## 2016-02-20 DIAGNOSIS — G039 Meningitis, unspecified: Secondary | ICD-10-CM | POA: Diagnosis not present

## 2016-02-20 DIAGNOSIS — Z955 Presence of coronary angioplasty implant and graft: Secondary | ICD-10-CM | POA: Diagnosis not present

## 2016-02-20 DIAGNOSIS — E785 Hyperlipidemia, unspecified: Secondary | ICD-10-CM | POA: Diagnosis not present

## 2016-02-20 DIAGNOSIS — I6621 Occlusion and stenosis of right posterior cerebral artery: Secondary | ICD-10-CM | POA: Diagnosis not present

## 2016-02-20 DIAGNOSIS — I10 Essential (primary) hypertension: Secondary | ICD-10-CM | POA: Diagnosis present

## 2016-02-20 DIAGNOSIS — I6789 Other cerebrovascular disease: Secondary | ICD-10-CM | POA: Diagnosis not present

## 2016-02-20 DIAGNOSIS — R402431 Glasgow coma scale score 3-8, in the field [EMT or ambulance]: Secondary | ICD-10-CM | POA: Diagnosis not present

## 2016-02-20 DIAGNOSIS — R410 Disorientation, unspecified: Secondary | ICD-10-CM | POA: Diagnosis not present

## 2016-02-20 DIAGNOSIS — I2510 Atherosclerotic heart disease of native coronary artery without angina pectoris: Secondary | ICD-10-CM | POA: Diagnosis not present

## 2016-02-20 DIAGNOSIS — K219 Gastro-esophageal reflux disease without esophagitis: Secondary | ICD-10-CM | POA: Diagnosis present

## 2016-02-20 DIAGNOSIS — J439 Emphysema, unspecified: Secondary | ICD-10-CM | POA: Diagnosis not present

## 2016-02-20 DIAGNOSIS — G2581 Restless legs syndrome: Secondary | ICD-10-CM | POA: Diagnosis present

## 2016-02-20 DIAGNOSIS — D62 Acute posthemorrhagic anemia: Secondary | ICD-10-CM | POA: Diagnosis not present

## 2016-02-20 DIAGNOSIS — D3610 Benign neoplasm of peripheral nerves and autonomic nervous system, unspecified: Secondary | ICD-10-CM | POA: Diagnosis present

## 2016-02-20 DIAGNOSIS — E7800 Pure hypercholesterolemia, unspecified: Secondary | ICD-10-CM | POA: Diagnosis not present

## 2016-02-20 DIAGNOSIS — I63331 Cerebral infarction due to thrombosis of right posterior cerebral artery: Secondary | ICD-10-CM | POA: Diagnosis not present

## 2016-02-20 HISTORY — DX: Sepsis, unspecified organism: A41.9

## 2016-02-20 LAB — APTT: aPTT: 30 seconds (ref 24–37)

## 2016-02-20 LAB — PROTIME-INR
INR: 1.21 (ref 0.00–1.49)
PROTHROMBIN TIME: 15.5 s — AB (ref 11.6–15.2)

## 2016-02-20 LAB — LACTIC ACID, PLASMA: LACTIC ACID, VENOUS: 1.3 mmol/L (ref 0.5–2.0)

## 2016-02-20 MED ORDER — VANCOMYCIN HCL IN DEXTROSE 1-5 GM/200ML-% IV SOLN
1000.0000 mg | INTRAVENOUS | Status: AC
  Administered 2016-02-21 – 2016-02-23 (×3): 1000 mg via INTRAVENOUS
  Filled 2016-02-20 (×4): qty 200

## 2016-02-20 MED ORDER — DEXTROSE 5 % IV SOLN
500.0000 mg | INTRAVENOUS | Status: AC
  Administered 2016-02-20: 500 mg via INTRAVENOUS
  Filled 2016-02-20: qty 10

## 2016-02-20 MED ORDER — VANCOMYCIN HCL IN DEXTROSE 1-5 GM/200ML-% IV SOLN
1000.0000 mg | Freq: Once | INTRAVENOUS | Status: DC
  Filled 2016-02-20: qty 200

## 2016-02-20 MED ORDER — DEXTROSE 5 % IV SOLN
2.0000 g | Freq: Once | INTRAVENOUS | Status: DC
  Filled 2016-02-20: qty 2

## 2016-02-20 MED ORDER — AMPICILLIN SODIUM 2 G IJ SOLR
2.0000 g | Freq: Once | INTRAMUSCULAR | Status: DC
  Filled 2016-02-20 (×2): qty 2000

## 2016-02-20 MED ORDER — DEXAMETHASONE SODIUM PHOSPHATE 10 MG/ML IJ SOLN
10.0000 mg | Freq: Once | INTRAMUSCULAR | Status: AC
  Administered 2016-02-20: 10 mg via INTRAVENOUS
  Filled 2016-02-20: qty 1

## 2016-02-20 MED ORDER — ONDANSETRON HCL 4 MG PO TABS
4.0000 mg | ORAL_TABLET | Freq: Four times a day (QID) | ORAL | Status: DC | PRN
  Administered 2016-02-23: 4 mg via ORAL
  Filled 2016-02-20: qty 1

## 2016-02-20 MED ORDER — SODIUM CHLORIDE 0.9% FLUSH
3.0000 mL | Freq: Two times a day (BID) | INTRAVENOUS | Status: DC
Start: 2016-02-20 — End: 2016-02-27
  Administered 2016-02-20 – 2016-02-26 (×11): 3 mL via INTRAVENOUS

## 2016-02-20 MED ORDER — ONDANSETRON HCL 4 MG/2ML IJ SOLN
4.0000 mg | Freq: Four times a day (QID) | INTRAMUSCULAR | Status: DC | PRN
  Administered 2016-02-21: 4 mg via INTRAVENOUS
  Filled 2016-02-20: qty 2

## 2016-02-20 MED ORDER — DEXTROSE 5 % IV SOLN
2.0000 g | Freq: Two times a day (BID) | INTRAVENOUS | Status: DC
  Filled 2016-02-20 (×2): qty 2

## 2016-02-20 MED ORDER — SODIUM CHLORIDE 0.9 % IV SOLN
1.0000 g | Freq: Four times a day (QID) | INTRAVENOUS | Status: DC
  Administered 2016-02-20 – 2016-02-25 (×19): 1 g via INTRAVENOUS
  Filled 2016-02-20 (×22): qty 1000

## 2016-02-20 MED ORDER — DEXTROSE 5 % IV SOLN
250.0000 mg | INTRAVENOUS | Status: DC
  Filled 2016-02-20: qty 5

## 2016-02-20 MED ORDER — SODIUM CHLORIDE 0.9 % IV SOLN
INTRAVENOUS | Status: DC
  Administered 2016-02-20 – 2016-02-22 (×3): via INTRAVENOUS
  Administered 2016-02-23: 1000 mL via INTRAVENOUS
  Administered 2016-02-24 – 2016-02-25 (×2): via INTRAVENOUS

## 2016-02-20 NOTE — Progress Notes (Signed)
ANTIBIOTIC CONSULT NOTE - INITIAL  Pharmacy Consult for Vanco/Rocephin/Ampicillin/Acyclovir Indication: r/o Meningitis  Allergies  Allergen Reactions  . Ace Inhibitors Cough  . Sulfa Antibiotics Nausea Only    Patient Measurements: Height: 5\' 2"  (157.5 cm) Weight: 106 lb 7.7 oz (48.3 kg) IBW/kg (Calculated) : 50.1 Adjusted Body Weight:    Vital Signs: Temp: 101.3 F (38.5 C) (06/09 2137) Temp Source: Rectal (06/09 2137) BP: 178/131 mmHg (06/09 2145) Pulse Rate: 115 (06/09 2145) Intake/Output from previous day:   Intake/Output from this shift:    Labs: No results for input(s): WBC, HGB, PLT, LABCREA, CREATININE in the last 72 hours. Estimated Creatinine Clearance: 47.8 mL/min (by C-G formula based on Cr of 0.47). No results for input(s): VANCOTROUGH, VANCOPEAK, VANCORANDOM, GENTTROUGH, GENTPEAK, GENTRANDOM, TOBRATROUGH, TOBRAPEAK, TOBRARND, AMIKACINPEAK, AMIKACINTROU, AMIKACIN in the last 72 hours.   Microbiology:   Medical History: Past Medical History  Diagnosis Date  . Diverticulitis   . Gastritis   . Hypercholesterolemia   . Hypertension   . Anxiety   . COPD (chronic obstructive pulmonary disease) ([place])   . DDD (degenerative disc disease)   . IBS (irritable bowel syndrome)   . Esophageal dysmotilities   . Anginal pain ([place])     years ago  . GERD (gastroesophageal reflux disease)   . Bronchitis, allergic     11-27-15 MD visit -2 injections given , oral Levaquin at present.  [name] Restless legs   . Coronary artery disease     stent placed 15 years ago Dr Lia Foyer. Dr. Steward Ros seen 2 yrs ago- no recent problems or follow up cardiology visits.  [name] PONV (postoperative nausea and vomiting)     nausea  . 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 She now presents from [facility] with AMS and respiratory failure.  Labs  from [place] today 6/9: WBC 25.7, H/H: 13.3/40, Plt 563 Na 131, K=3.1, Cl 90, Bicarb 28, BUN 7, Scr 0.5, Glucose 206  ID: s/p recent spinal surgery. R/o meningitis. Temp 101.3  Rocephin 6/9>> Vanco 6/9 (huge load)>> Ampicillin 6/9>> Acyclovir 6/9>>  Goal of Therapy:  Vanco trough 15-20  Plan:  Vanco 1500mg  (31 mg/kg) x 1 at [facility] (1616), then 1g IV q24h Rocephin 2g IV q12h (dose at [facility]) Ampicillin 2g IV x 1 then 1g IV q6hr Acyclovir 500mg  IV x 1 then 250mg  IV q 24h   Truitt Cruey S. Alford Highland, PharmD, BCPS Clinical Staff Pharmacist Pager [phone]  Eilene Ghazi Stillinger 02/20/2016,10:51 PM

## 2016-02-20 NOTE — Progress Notes (Addendum)
History and Physical    Melinda Schroeder [id number] DOB: 10-Sep-1941 DOA: 02/20/2016  Referring MD/NP/PA: Velvet Bathe, MD PCP: Laverna Hopping, NP  Patient coming from: Transfer from [facility]  Chief Complaint: Encephalopathy  HPI: Melinda Schroeder is a 75 y.o. female with medical history significant of COPD, HTN, HLD, GERD, anxiety; who presents after complaints of being acutely altered this morning. Family gives history as patient is unable to do so due to her acute state. Last seen normal around 7:30 AM this morning when family went to go back and check on her around 9 AM she was acutely altered. Family notes that she was not talking and she was taking off all her closed and tossing all over the bed. They report that there is been no change in her overall mental status since that time. At the outside facility she was fashioned about 6 more so that she call significant bruising of the bilateral upper extremities and lower extremities. 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 At baseline patient was able to complete all of her ADLs basically without assistance per family.   ED Course: Per review of records at [facility] patient was seen to be febrile up to 102.75F, tachycardic, and tachypneic. Sepsis protocol was initiated and she was given vancomycin and Rocephin. WBC count was noted to be 25.7. They obtain a LP which CSF fluid tube# 1 revealed WBCs 97, 92% lymphocytes, RBCs 7, glucose 57, protein 159. Tube 4 showed 105 WBCs, 5 RBCs. Could not find documentation on finding opening pressure. CSF fluid was sent for cultures and further studies. 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 Also, there was edema and fluid posterior to the cervical spine at the  level of the posterior C5-6 fusion extending to the skull base. Family states that patient had chest x-ray at outside facility and therefore previous order was canceled. Family notes that they would like to keep the patient a full code for now.  Review of Systems: As per HPI otherwise 10 point review of systems negative.   Past Medical History  Diagnosis Date  . Diverticulitis   . Gastritis   . Hypercholesterolemia   . Hypertension   . Anxiety   . COPD (chronic obstructive pulmonary disease) ([place])   . DDD (degenerative disc disease)   . IBS (irritable bowel syndrome)   . Esophageal dysmotilities   . Anginal pain ([place])     years ago  . GERD (gastroesophageal reflux disease)   . Bronchitis, allergic     11-27-15 MD visit -2 injections given , oral Levaquin at present.  [name] Restless legs   . Coronary artery disease     stent placed 15 years ago Dr Lia Foyer. Dr. Steward Ros seen 2 yrs ago- no recent problems or follow up cardiology visits.  [name] PONV (postoperative nausea and vomiting)     nausea  . Shortness of breath dyspnea     Past Surgical History  Procedure Laterality Date  . Bladder surgery    . Breast mass right excision    . Hernia repair    . Coronary stent placement    . Appendectomy    . Shoulder sugery Left     rtc repair  . Anterior cervical decomp/discectomy fusion N/A  06/08/2013    Procedure: Cervical Four Five anterior cervical decompression with fusion plating and bonegraft;  Surgeon: Winfield Cunas, MD;  Location: [facility];  Service: Neurosurgery;  Laterality: N/A;  ANTERIOR CERVICAL DECOMPRESSION/DISCECTOMY FUSION 1 LEVEL  . Abdominal hysterectomy      complete  . Eus N/A 10/17/2014    Procedure: UPPER ENDOSCOPIC ULTRASOUND (EUS) LINEAR;  Surgeon: Milus Banister, MD;  Location: [facility] ENDOSCOPY;  Service: Endoscopy;  Laterality: N/A;  . Eus N/A 12/04/2015    Procedure: UPPER ENDOSCOPIC ULTRASOUND (EUS) RADIAL;  Surgeon: Milus Banister, MD;  Location: [facility] ENDOSCOPY;   Service: Endoscopy;  Laterality: N/A;  . Laminectomy Left 02/11/2016    Procedure: Left C1-2 Tumor resection/C5-6 Posterior cervical fusion with lateral mass fixation;  Surgeon: Ashok Pall, MD;  Location: [place] [facility];  Service: Neurosurgery;  Laterality: Left;  Left C1-2 Tumor resection/C5-6 Posterior cervical fusion with lateral mass fixation      reports that she has been smoking Cigarettes.  She has a 50 pack-year smoking history. She has never used smokeless tobacco. She reports that she does not drink alcohol or use illicit drugs.  Allergies  Allergen Reactions  . Ace Inhibitors Cough  . Sulfa Antibiotics Nausea Only    Family History  Problem Relation Age of Onset  . Emphysema Father   . Stomach cancer Brother   . Stroke Mother     Prior to Admission medications   Medication Sig Start Date End Date Taking? Authorizing Provider  LORazepam (ATIVAN) 0.5 MG tablet Take 0.5 mg by mouth every 6 (six) hours as needed for anxiety.    Historical Provider, MD  losartan-hydrochlorothiazide (HYZAAR) 100-12.5 MG per tablet Take 1 tablet by mouth every morning.     Historical Provider, MD  metoprolol succinate (TOPROL-XL) 100 MG 24 hr tablet Take 100 mg by mouth 2 (two) times daily. Take with or immediately following a meal.    Historical Provider, MD  mirtazapine (REMERON) 15 MG tablet Take 15 mg by mouth at bedtime.    Historical Provider, MD  nitroGLYCERIN (NITROSTAT) 0.4 MG SL tablet Place 0.4 mg under the tongue every 5 (five) minutes as needed for chest pain.    Historical Provider, MD  omeprazole (PRILOSEC) 20 MG capsule Take 20 mg by mouth 2 (two) times daily before a meal.     Historical Provider, MD  oxyCODONE-acetaminophen (PERCOCET/ROXICET) 5-325 MG tablet Take 1-2 tablets by mouth every 6 (six) hours as needed for moderate pain. 02/17/16   Ashok Pall, MD  PARoxetine (PAXIL) 20 MG tablet Take 20 mg by mouth every morning.     Historical Provider, MD  potassium chloride  (K-DUR,KLOR-CON) 10 MEQ tablet Take 10 mEq by mouth 2 (two) times daily.    Historical Provider, MD  simvastatin (ZOCOR) 80 MG tablet Take 80 mg by mouth at bedtime.    Historical Provider, MD  tiotropium (SPIRIVA) 18 MCG inhalation capsule Place 18 mcg into inhaler and inhale daily as needed (Shortness of breath).     Historical Provider, MD  tiZANidine (ZANAFLEX) 4 MG tablet Take 1 tablet (4 mg total) by mouth every 6 (six) hours as needed for muscle spasms. 02/17/16   Ashok Pall, MD    Physical Exam: Filed Vitals:   02/20/16 2137 02/20/16 2143 02/20/16 2145  BP: 129/66 153/76 178/131  Pulse: 104 115 115  Temp: 101.3 F (38.5 C)    TempSrc: Rectal    Resp: 24 25 23   Height: 5\' 2"  (1.575 m)  Weight: 48.3 kg (106 lb 7.7 oz)    SpO2: 99% 100% 100%      Constitutional: Elderly female acutely altered and not following commands Filed Vitals:   02/20/16 2137 02/20/16 2143 02/20/16 2145  BP: 129/66 153/76 178/131  Pulse: 104 115 115  Temp: 101.3 F (38.5 C)    TempSrc: Rectal    Resp: 24 25 23   Height: 5\' 2"  (1.575 m)    Weight: 48.3 kg (106 lb 7.7 oz)    SpO2: 99% 100% 100%   Eyes: PERRL, lids and conjunctivae normal ENMT: Mucous membranes Dry. Posterior pharynx clear of any exudate or lesions.Normal dentition.  Neck: Neck does not appear to be supple, no masses, no thyromegaly Respiratory: Tachypneic, clear to auscultation bilaterally, no wheezing, no crackles.  Cardiovascular: Tachycardic, no murmur appreciated,  pulses 2+  Abdomen: no tenderness, no masses palpated. No hepatosplenomegaly. Bowel sounds positive.  Musculoskeletal: no clubbing / cyanosis. No joint deformity upper and lower extremities. Good ROM, no contractures. Normal muscle tone.  Skin: Multiple bruises/ ecchymoses of the upper and lower extremities. Skin tears present of the lower extremity. Cervical surgical site appears clean with only a small area of mild erythema noted near one of the  sutures Neurologic: Patient appears able to move all extremities of facial drooping appreciated. Psychiatric: Cannot be assessed as patient is acutely altered    Labs on Admission: I have personally reviewed following labs and imaging studies  CBC: No results for input(s): WBC, NEUTROABS, HGB, HCT, MCV, PLT in the last 168 hours. Basic Metabolic Panel: No results for input(s): NA, K, CL, CO2, GLUCOSE, BUN, CREATININE, CALCIUM, MG, PHOS in the last 168 hours. GFR: Estimated Creatinine Clearance: 47.8 mL/min (by C-G formula based on Cr of 0.47). Liver Function Tests: No results for input(s): AST, ALT, ALKPHOS, BILITOT, PROT, ALBUMIN in the last 168 hours. No results for input(s): LIPASE, AMYLASE in the last 168 hours. No results for input(s): AMMONIA in the last 168 hours. Coagulation Profile: No results for input(s): INR, PROTIME in the last 168 hours. Cardiac Enzymes: No results for input(s): CKTOTAL, CKMB, CKMBINDEX, TROPONINI in the last 168 hours. BNP (last 3 results) No results for input(s): PROBNP in the last 8760 hours. HbA1C: No results for input(s): HGBA1C in the last 72 hours. CBG:  Recent Labs Lab 02/17/16 1628  GLUCAP 103*   Lipid Profile: No results for input(s): CHOL, HDL, LDLCALC, TRIG, CHOLHDL, LDLDIRECT in the last 72 hours. Thyroid Function Tests: No results for input(s): TSH, T4TOTAL, FREET4, T3FREE, THYROIDAB in the last 72 hours. Anemia Panel: No results for input(s): VITAMINB12, FOLATE, FERRITIN, TIBC, IRON, RETICCTPCT in the last 72 hours. Urine analysis: No results found for: COLORURINE, APPEARANCEUR, LABSPEC, PHURINE, GLUCOSEU, HGBUR, BILIRUBINUR, KETONESUR, PROTEINUR, UROBILINOGEN, NITRITE, LEUKOCYTESUR Sepsis Labs: No results found for this or any previous visit (from the past 240 hour(s)).   Radiological Exams on Admission: No results found.    Assessment/Plan Sepsis 2/2 suspected meningitis with acute encephalopathy:Acute. Pt with recent  C5/6 fusion and removal of schwannoma tumor was in her normal state of health until this morning. WBC 25.7 and LP concerning as tube# 1 revealed WBCs 97, 92% lymphocytes, RBCs 7, glucose 57, protein 159. Tube 4 showed 105 WBCs, 5 RBCs. Patient was only given vancomycin and Zosyn at the outside facility. - Admit to stepdown - Sepsis protocol initiated - Follow-up blood cultures - Empiric antibiotics of vancomycin, Rocephin, ampicillin, and acyclovir - Follow-up neurology and neurosurgery recommendations - Follow up with [facility]  health regarding LP cultures obtained  Recent C5/C6 surgery with removal of neural sheath tumor - Continue to monitor   Hypokalemia: Potassium 3.1.  - Potassium chloride IV - Continue to monitor and replace as needed  DVT prophylaxis: SCDs Code Status: Full  Family Communication: Discussed case with patient's son and friend at bedside. Disposition Plan: Undetermined Consults called: Consults sent to neurology Dr. Nicole Kindred and neurosurgery Dr. Christella Noa( said he would see in a.m. Admission status: Stepdown inpatient  Norval Morton MD [facility] Pager [phone]  If 7PM-7AM, please contact night-coverage [email or web address] Password [email or web address]  02/20/2016, 10:51 PM

## 2016-02-21 DIAGNOSIS — A872 Lymphocytic choriomeningitis: Secondary | ICD-10-CM

## 2016-02-21 DIAGNOSIS — A419 Sepsis, unspecified organism: Secondary | ICD-10-CM | POA: Diagnosis present

## 2016-02-21 DIAGNOSIS — I63331 Cerebral infarction due to thrombosis of right posterior cerebral artery: Secondary | ICD-10-CM

## 2016-02-21 DIAGNOSIS — E876 Hypokalemia: Secondary | ICD-10-CM | POA: Diagnosis present

## 2016-02-21 DIAGNOSIS — G039 Meningitis, unspecified: Secondary | ICD-10-CM | POA: Diagnosis present

## 2016-02-21 LAB — CBC WITH DIFFERENTIAL/PLATELET
BASOS PCT: 0 %
Basophils Absolute: 0 10*3/uL (ref 0.0–0.1)
EOS PCT: 0 %
Eosinophils Absolute: 0 10*3/uL (ref 0.0–0.7)
HEMATOCRIT: 33.4 % — AB (ref 36.0–46.0)
Hemoglobin: 11.1 g/dL — ABNORMAL LOW (ref 12.0–15.0)
LYMPHS PCT: 18 %
Lymphs Abs: 3.5 10*3/uL (ref 0.7–4.0)
MCH: 28 pg (ref 26.0–34.0)
MCHC: 33.2 g/dL (ref 30.0–36.0)
MCV: 84.3 fL (ref 78.0–100.0)
MONOS PCT: 10 %
Monocytes Absolute: 1.9 10*3/uL — ABNORMAL HIGH (ref 0.1–1.0)
NEUTROS PCT: 72 %
Neutro Abs: 13.8 10*3/uL — ABNORMAL HIGH (ref 1.7–7.7)
PLATELETS: 420 10*3/uL — AB (ref 150–400)
RBC: 3.96 MIL/uL (ref 3.87–5.11)
RDW: 13.1 % (ref 11.5–15.5)
WBC: 19.2 10*3/uL — AB (ref 4.0–10.5)

## 2016-02-21 LAB — BASIC METABOLIC PANEL
Anion gap: 11 (ref 5–15)
BUN: <5 mg/dL — ABNORMAL LOW (ref 6–20)
CALCIUM: 7.3 mg/dL — AB (ref 8.9–10.3)
CO2: 25 mmol/L (ref 22–32)
CREATININE: 0.58 mg/dL (ref 0.44–1.00)
Chloride: 94 mmol/L — ABNORMAL LOW (ref 101–111)
GFR calc Af Amer: >60 mL/min (ref >60)
Glucose, Bld: 161 mg/dL — ABNORMAL HIGH (ref 65–99)
Potassium: 2.5 mmol/L — CL (ref 3.5–5.1)
SODIUM: 130 mmol/L — AB (ref 135–145)

## 2016-02-21 LAB — PROCALCITONIN: Procalcitonin: 0.72 ng/mL

## 2016-02-21 LAB — CBC
HCT: 33.2 % — ABNORMAL LOW (ref 36.0–46.0)
HEMOGLOBIN: 11.1 g/dL — AB (ref 12.0–15.0)
MCH: 28.6 pg (ref 26.0–34.0)
MCHC: 33.4 g/dL (ref 30.0–36.0)
MCV: 85.6 fL (ref 78.0–100.0)
Platelets: 404 10*3/uL — ABNORMAL HIGH (ref 150–400)
RBC: 3.88 MIL/uL (ref 3.87–5.11)
RDW: 13.5 % (ref 11.5–15.5)
WBC: 13.2 10*3/uL — ABNORMAL HIGH (ref 4.0–10.5)

## 2016-02-21 LAB — COMPREHENSIVE METABOLIC PANEL
ALK PHOS: 62 U/L (ref 38–126)
ALT: 16 U/L (ref 14–54)
ALT: 17 U/L (ref 14–54)
AST: 31 U/L (ref 15–41)
AST: 31 U/L (ref 15–41)
Albumin: 2.4 g/dL — ABNORMAL LOW (ref 3.5–5.0)
Albumin: 2.5 g/dL — ABNORMAL LOW (ref 3.5–5.0)
Alkaline Phosphatase: 66 U/L (ref 38–126)
Anion gap: 12 (ref 5–15)
Anion gap: 13 (ref 5–15)
BILIRUBIN TOTAL: 0.9 mg/dL (ref 0.3–1.2)
BUN: <5 mg/dL — ABNORMAL LOW (ref 6–20)
CALCIUM: 7.9 mg/dL — AB (ref 8.9–10.3)
CHLORIDE: 89 mmol/L — AB (ref 101–111)
CO2: 26 mmol/L (ref 22–32)
CO2: 27 mmol/L (ref 22–32)
CREATININE: 0.52 mg/dL (ref 0.44–1.00)
CREATININE: 0.55 mg/dL (ref 0.44–1.00)
Calcium: 8 mg/dL — ABNORMAL LOW (ref 8.9–10.3)
Chloride: 90 mmol/L — ABNORMAL LOW (ref 101–111)
Glucose, Bld: 130 mg/dL — ABNORMAL HIGH (ref 65–99)
Glucose, Bld: 162 mg/dL — ABNORMAL HIGH (ref 65–99)
POTASSIUM: 2.1 mmol/L — AB (ref 3.5–5.1)
Potassium: 3.1 mmol/L — ABNORMAL LOW (ref 3.5–5.1)
Sodium: 128 mmol/L — ABNORMAL LOW (ref 135–145)
Sodium: 129 mmol/L — ABNORMAL LOW (ref 135–145)
TOTAL PROTEIN: 6 g/dL — AB (ref 6.5–8.1)
Total Bilirubin: 0.9 mg/dL (ref 0.3–1.2)
Total Protein: 5.7 g/dL — ABNORMAL LOW (ref 6.5–8.1)

## 2016-02-21 LAB — GLUCOSE, CAPILLARY
GLUCOSE-CAPILLARY: 156 mg/dL — AB (ref 65–99)
GLUCOSE-CAPILLARY: 177 mg/dL — AB (ref 65–99)

## 2016-02-21 LAB — TYPE AND SCREEN
ABO/RH(D): O POS
Antibody Screen: NEGATIVE

## 2016-02-21 LAB — LACTIC ACID, PLASMA: Lactic Acid, Venous: 0.8 mmol/L (ref 0.5–2.0)

## 2016-02-21 LAB — MRSA PCR SCREENING: MRSA BY PCR: NEGATIVE

## 2016-02-21 MED ORDER — STROKE: EARLY STAGES OF RECOVERY BOOK
Freq: Once | Status: AC
  Administered 2016-02-21: 1
  Filled 2016-02-21: qty 1

## 2016-02-21 MED ORDER — CHLORHEXIDINE GLUCONATE 0.12 % MT SOLN
15.0000 mL | Freq: Two times a day (BID) | OROMUCOSAL | Status: DC
  Administered 2016-02-21 – 2016-02-25 (×10): 15 mL via OROMUCOSAL
  Filled 2016-02-21 (×8): qty 15

## 2016-02-21 MED ORDER — DEXTROSE 5 % IV SOLN
2.0000 g | Freq: Two times a day (BID) | INTRAVENOUS | Status: DC
  Administered 2016-02-21 – 2016-02-25 (×9): 2 g via INTRAVENOUS
  Filled 2016-02-21 (×10): qty 2

## 2016-02-21 MED ORDER — LORAZEPAM 0.5 MG PO TABS
0.5000 mg | ORAL_TABLET | Freq: Four times a day (QID) | ORAL | Status: DC | PRN
Start: 2016-02-21 — End: 2016-02-24
  Administered 2016-02-23 (×2): 0.5 mg via ORAL
  Filled 2016-02-21 (×2): qty 1

## 2016-02-21 MED ORDER — ACYCLOVIR SODIUM 50 MG/ML IV SOLN
10.0000 mg/kg | Freq: Three times a day (TID) | INTRAVENOUS | Status: DC
  Administered 2016-02-21 – 2016-02-25 (×13): 485 mg via INTRAVENOUS
  Filled 2016-02-21 (×14): qty 9.7

## 2016-02-21 MED ORDER — POTASSIUM CHLORIDE 10 MEQ/100ML IV SOLN
10.0000 meq | INTRAVENOUS | Status: AC
  Administered 2016-02-21 – 2016-02-22 (×6): 10 meq via INTRAVENOUS
  Filled 2016-02-21 (×2): qty 100

## 2016-02-21 MED ORDER — MIRTAZAPINE 15 MG PO TABS
15.0000 mg | ORAL_TABLET | Freq: Every day | ORAL | Status: DC
  Administered 2016-02-21 – 2016-02-26 (×6): 15 mg via ORAL
  Filled 2016-02-21 (×8): qty 1

## 2016-02-21 MED ORDER — CETYLPYRIDINIUM CHLORIDE 0.05 % MT LIQD
7.0000 mL | Freq: Two times a day (BID) | OROMUCOSAL | Status: DC
  Administered 2016-02-21 – 2016-02-27 (×11): 7 mL via OROMUCOSAL

## 2016-02-21 MED ORDER — OXYCODONE-ACETAMINOPHEN 5-325 MG PO TABS
1.0000 | ORAL_TABLET | Freq: Four times a day (QID) | ORAL | Status: DC | PRN
  Administered 2016-02-21: 1 via ORAL
  Administered 2016-02-23 – 2016-02-24 (×4): 2 via ORAL
  Administered 2016-02-25: 1 via ORAL
  Administered 2016-02-25: 2 via ORAL
  Administered 2016-02-25: 1 via ORAL
  Administered 2016-02-26 – 2016-02-27 (×5): 2 via ORAL
  Filled 2016-02-21 (×2): qty 1
  Filled 2016-02-21 (×8): qty 2
  Filled 2016-02-21: qty 1
  Filled 2016-02-21 (×3): qty 2

## 2016-02-21 MED ORDER — METOPROLOL SUCCINATE ER 100 MG PO TB24
100.0000 mg | ORAL_TABLET | Freq: Two times a day (BID) | ORAL | Status: DC
  Administered 2016-02-21 – 2016-02-27 (×13): 100 mg via ORAL
  Filled 2016-02-21 (×13): qty 1

## 2016-02-21 MED ORDER — ATORVASTATIN CALCIUM 40 MG PO TABS
40.0000 mg | ORAL_TABLET | Freq: Every day | ORAL | Status: DC
  Administered 2016-02-21 – 2016-02-26 (×6): 40 mg via ORAL
  Filled 2016-02-21 (×6): qty 1

## 2016-02-21 MED ORDER — ACETAMINOPHEN 650 MG RE SUPP
650.0000 mg | Freq: Four times a day (QID) | RECTAL | Status: DC | PRN
  Administered 2016-02-21: 650 mg via RECTAL
  Filled 2016-02-21: qty 1

## 2016-02-21 MED ORDER — TIOTROPIUM BROMIDE MONOHYDRATE 18 MCG IN CAPS
18.0000 ug | ORAL_CAPSULE | Freq: Every day | RESPIRATORY_TRACT | Status: DC | PRN
  Filled 2016-02-21: qty 5

## 2016-02-21 MED ORDER — PANTOPRAZOLE SODIUM 40 MG PO TBEC
40.0000 mg | DELAYED_RELEASE_TABLET | Freq: Every day | ORAL | Status: DC
  Administered 2016-02-21 – 2016-02-27 (×7): 40 mg via ORAL
  Filled 2016-02-21 (×7): qty 1

## 2016-02-21 MED ORDER — POTASSIUM CHLORIDE 10 MEQ/100ML IV SOLN
10.0000 meq | INTRAVENOUS | Status: AC
  Administered 2016-02-21 (×6): 10 meq via INTRAVENOUS
  Filled 2016-02-21 (×6): qty 100

## 2016-02-21 MED ORDER — PAROXETINE HCL 20 MG PO TABS
20.0000 mg | ORAL_TABLET | Freq: Every morning | ORAL | Status: DC
  Administered 2016-02-21 – 2016-02-27 (×7): 20 mg via ORAL
  Filled 2016-02-21 (×7): qty 1

## 2016-02-21 NOTE — Progress Notes (Signed)
PROGRESS NOTE    Melinda Schroeder  [id number] DOB: October 30, 1940 DOA: 02/20/2016 PCP: Laverna Cannell, NP    Brief Narrative:  Melinda Schroeder is a 75 y.o. female with medical history significant of COPD, HTN, HLD, GERD, anxiety presents with acute encephalopathy. 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 At baseline patient was able to complete all of her ADLs basically without assistance per family.  Per review of records at [facility] patient was seen to be febrile up to 102.24F, tachycardic, and tachypneic. Sepsis protocol was initiated and she was given vancomycin and Rocephin. WBC count was noted to be 25.7. They obtain a LP which CSF fluid tube# 1 revealed WBCs 97, 92% lymphocytes, RBCs 7, glucose 57, protein 159. Tube 4 showed 105 WBCs, 5 RBCs. Could not find documentation on finding opening pressure. CSF fluid was sent for cultures and further studies. 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 Also, there was edema and fluid posterior to the cervical spine at the level of the posterior C5-6 fusion extending to the skull base.  Assessment & Plan:   Principal Problem:   Sepsis ([place]) Active Problems:   Acute encephalopathy   Meningitis   Hypokalemia  Suspected sepsis from meningitis with acute encephalopathy :with h/o of recent surgery.  Admitted to stepdown and started on broad spectrum antibiotics with vancomycin, ampicillin, rocephin and acyclovir, .  Csf cultures, blood cultures are pending.  Neurology and neurosurgery consulted and recommendations given.  Follow up MRI AND MRA BRAIN.  Lactic acid normal.    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   Hypokalemia replete as needed.    Repeat in am.    Leukocytosis:  Improving.    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  Monitor, if persistently low, will get further work up for SIADH.     DVT prophylaxis: (/SCD's) Code Status: (Full) Family Communication: son at bedside.  Disposition Plan: pending further management, pending PT EVAL.    Consultants:   Neurosurgery   Neurology.    Procedures: MRI brain   MRA brain   Antimicrobials: vancomycin  Rocephin  Ampicillin  Acyclovir.    Subjective: No new complaints.   Objective: Filed Vitals:   02/21/16 0438 02/21/16 0500 02/21/16 0600 02/21/16 0732  BP: 122/79 146/52 136/76 141/68  Pulse: 108 101 105 105  Temp: 98 F (36.7 C)   97.5 F (36.4 C)  TempSrc: Rectal   Oral  Resp: 27 27 25    Height:      Weight:      SpO2: 100% 100% 100% 100%    Intake/Output Summary (Last 24 hours) at 02/21/16 1100 Last data filed at 02/21/16 0810  Gross per 24 hour  Intake 1501.67 ml  Output    900 ml  Net 601.67 ml   Filed Weights   02/20/16 2137  Weight: 48.3 kg (106 lb 7.7 oz)    Examination:  General exam: sleeping.  Respiratory system: Clear to auscultation. Respiratory effort normal. Cardiovascular system: S1 & S2 heard, RRR. No JVD, murmurs, rubs, gallops or clicks. No pedal edema. Gastrointestinal system: Abdomen is nondistended, soft and nontender. No organomegaly or masses felt. Normal bowel sounds heard. Central nervous system: drowsy,  no new focal deficits.  Extremities: Symmetric 5 x 5 power. Skin: No rashes, lesions or ulcers    Data Reviewed: I have personally reviewed following labs and imaging studies  CBC:  Recent Labs Lab 02/20/16 2330 02/21/16 0518  WBC 19.2* 13.2*  NEUTROABS 13.8*  --   HGB 11.1* 11.1*  HCT 33.4* 33.2*  MCV 84.3 85.6  PLT 420* [id number]*   Basic Metabolic Panel:  Recent Labs Lab 02/20/16 2330 02/21/16 0518  NA 128* 129*  K 2.1* 3.1*  CL 89* 90*  CO2 26 27  GLUCOSE  130* 162*  BUN <5* <5*  CREATININE 0.55 0.52  CALCIUM 8.0* 7.9*   GFR: Estimated Creatinine Clearance: 47.8 mL/min (by C-G formula based on Cr of 0.52). Liver Function Tests:  Recent Labs Lab 02/20/16 2330 02/21/16 0518  AST 31 31  ALT 16 17  ALKPHOS 66 62  BILITOT 0.9 0.9  PROT 6.0* 5.7*  ALBUMIN 2.5* 2.4*   No results for input(s): LIPASE, AMYLASE in the last 168 hours. No results for input(s): AMMONIA in the last 168 hours. Coagulation Profile:  Recent Labs Lab 02/20/16 2330  INR 1.21   Cardiac Enzymes: No results for input(s): CKTOTAL, CKMB, CKMBINDEX, TROPONINI in the last 168 hours. BNP (last 3 results) No results for input(s): PROBNP in the last 8760 hours. HbA1C: No results for input(s): HGBA1C in the last 72 hours. CBG:  Recent Labs Lab 02/17/16 1628 02/21/16 0024 02/21/16 0444  GLUCAP 103* 156* 177*   Lipid Profile: No results for input(s): CHOL, HDL, LDLCALC, TRIG, CHOLHDL, LDLDIRECT in the last 72 hours. Thyroid Function Tests: No results for input(s): TSH, T4TOTAL, FREET4, T3FREE, THYROIDAB in the last 72 hours. Anemia Panel: No results for input(s): VITAMINB12, FOLATE, FERRITIN, TIBC, IRON, RETICCTPCT in the last 72 hours. Sepsis Labs:  Recent Labs Lab 02/20/16 2330 02/21/16 0519  PROCALCITON 0.72  --   LATICACIDVEN 1.3 0.8    No results found for this or any previous visit (from the past 240 hour(s)).       Radiology Studies: No results found.      Scheduled Meds: . acyclovir  10 mg/kg Intravenous Q8H  . ampicillin (OMNIPEN) IV  1 g Intravenous Q6H  . antiseptic oral rinse  7 mL Mouth Rinse q12n4p  . cefTRIAXone (ROCEPHIN)  IV  2 g Intravenous BID  . chlorhexidine  15 mL Mouth Rinse BID  . sodium chloride flush  3 mL Intravenous Q12H  . vancomycin  1,000 mg Intravenous Q24H   Continuous Infusions: . sodium chloride 100 mL/hr at 02/20/16 2329     LOS: 1 day    Time spent: 35 minutes.     Hosie Poisson, MD [facility] Pager [phone]   If 7PM-7AM, please contact night-coverage [email or web address] Password [email or web address] 02/21/2016, 11:00 AM

## 2016-02-21 NOTE — Progress Notes (Signed)
ANTIBIOTIC CONSULT NOTE - Follow Up Note   Pharmacy Consult for Vanco/Rocephin/Ampicillin/Acyclovir Indication: r/o Meningitis  Allergies  Allergen Reactions  . Ace Inhibitors Cough  . Sulfa Antibiotics Nausea Only    Patient Measurements: Height: 5\' 2"  (157.5 cm) Weight: 106 lb 7.7 oz (48.3 kg) IBW/kg (Calculated) : 50.1 Adjusted Body Weight:    Vital Signs: Temp: 97.5 F (36.4 C) (06/10 0732) Temp Source: Oral (06/10 0732) BP: 141/68 mmHg (06/10 0732) Pulse Rate: 105 (06/10 0732) Intake/Output from previous day: 06/09 0701 - 06/10 0700 In: 1501.7 [I.V.:751.7; IV Piggyback:750] Out: 500 [Urine:500] Intake/Output from this shift: Total I/O In: -  Out: 400 [Urine:400]  Labs:  Recent Labs  02/20/16 2330 02/21/16 0518  WBC 19.2* 13.2*  HGB 11.1* 11.1*  PLT 420* 404*  CREATININE 0.55 0.52   Estimated Creatinine Clearance: 47.8 mL/min (by C-G formula based on Cr of 0.52). No results for input(s): VANCOTROUGH, VANCOPEAK, VANCORANDOM, GENTTROUGH, GENTPEAK, GENTRANDOM, TOBRATROUGH, TOBRAPEAK, TOBRARND, AMIKACINPEAK, AMIKACINTROU, AMIKACIN in the last 72 hours.   Microbiology:   Medical History: Past Medical History  Diagnosis Date  . Diverticulitis   . Gastritis   . Hypercholesterolemia   . Hypertension   . Anxiety   . COPD (chronic obstructive pulmonary disease) ([place])   . DDD (degenerative disc disease)   . IBS (irritable bowel syndrome)   . Esophageal dysmotilities   . Anginal pain ([place])     years ago  . GERD (gastroesophageal reflux disease)   . Bronchitis, allergic     11-27-15 MD visit -2 injections given , oral Levaquin at present.  [name] Restless legs   . Coronary artery disease     stent placed 15 years ago Dr Lia Foyer. Dr. Steward Ros seen 2 yrs ago- no recent problems or follow up cardiology visits.  [name] PONV (postoperative nausea and vomiting)     nausea  . 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 She presented from [facility] with AMS and respiratory failure. Currently on Vancomycin, ampicillin, acyclovir and rocephin for r/o meningitis   ID: s/p recent spinal surgery. R/o meningitis. Tm 101.3, no afebrile. WBC down to 13.2  Rocephin 6/9>> Vanco 6/9 (huge load)>> Ampicillin 6/9>> Acyclovir 6/9>>  Goal of Therapy:  Vanco trough 15-20  Plan:  Vancomycin 1g IV q24h Rocephin 2g IV q12h  Ampicillin 1g IV q6hr Increase acyclovir to 10 mg/kg Q 8 hours    Albertina Parr, PharmD., BCPS Clinical Pharmacist Pager [phone]

## 2016-02-21 NOTE — Progress Notes (Signed)
CRITICAL VALUE ALERT  Critical value received:  K 2.5  Date of notification:  02/21/2016  Time of notification:  2121  Critical value read back:Yes.    Nurse who received alert:  Hadassah Pais RN  MD notified (1st page):  Lamar Blinks NP  Time of first page:  2124     Responding MD: K.Schorr NP  Time MD responded:  See orders

## 2016-02-21 NOTE — Evaluation (Signed)
Clinical/Bedside Swallow Evaluation Patient Details  Name: Melinda Schroeder MRN: [id number] Date of Birth: Dec 01, 1940  Today's Date: 02/21/2016 Time: SLP Start Time (ACUTE ONLY): 1445 SLP Stop Time (ACUTE ONLY): 1501 SLP Time Calculation (min) (ACUTE ONLY): 16 min  Past Medical History:  Past Medical History  Diagnosis Date  . Diverticulitis   . Gastritis   . Hypercholesterolemia   . Hypertension   . Anxiety   . COPD (chronic obstructive pulmonary disease) ([place])   . DDD (degenerative disc disease)   . IBS (irritable bowel syndrome)   . Esophageal dysmotilities   . Anginal pain ([place])     years ago  . GERD (gastroesophageal reflux disease)   . Bronchitis, allergic     11-27-15 MD visit -2 injections given , oral Levaquin at present.  [name] Restless legs   . Coronary artery disease     stent placed 15 years ago Dr Lia Foyer. Dr. Steward Ros seen 2 yrs ago- no recent problems or follow up cardiology visits.  [name] PONV (postoperative nausea and vomiting)     nausea  . Shortness of breath dyspnea    Past Surgical History:  Past Surgical History  Procedure Laterality Date  . Bladder surgery    . Breast mass right excision    . Hernia repair    . Coronary stent placement    . Appendectomy    . Shoulder sugery Left     rtc repair  . Anterior cervical decomp/discectomy fusion N/A 06/08/2013    Procedure: Cervical Four Five anterior cervical decompression with fusion plating and bonegraft;  Surgeon: Winfield Cunas, MD;  Location: [facility];  Service: Neurosurgery;  Laterality: N/A;  ANTERIOR CERVICAL DECOMPRESSION/DISCECTOMY FUSION 1 LEVEL  . Abdominal hysterectomy      complete  . Eus N/A 10/17/2014    Procedure: UPPER ENDOSCOPIC ULTRASOUND (EUS) LINEAR;  Surgeon: Milus Banister, MD;  Location: [facility] ENDOSCOPY;  Service: Endoscopy;  Laterality: N/A;  . Eus N/A 12/04/2015    Procedure: UPPER ENDOSCOPIC ULTRASOUND (EUS) RADIAL;  Surgeon: Milus Banister, MD;  Location: [facility] ENDOSCOPY;  Service:  Endoscopy;  Laterality: N/A;  . Laminectomy Left 02/11/2016    Procedure: Left C1-2 Tumor resection/C5-6 Posterior cervical fusion with lateral mass fixation;  Surgeon: Ashok Pall, MD;  Location: [facility] OR[facility];  Service: Neurosurgery;  Laterality: Left;  Left C1-2 Tumor resection/C5-6 Posterior cervical fusion with lateral mass fixation    HPI:  Pt is a 75 yo female with a hx of recent neck sx and now possible menengitis (being r/o); nursing noted coughing with liquids with morning medication; pt has been lethargic and confused per family since symptoms started on 02/20/16; pt was transferred from [facility] on 02/20/16   Assessment / Plan / Recommendation Clinical Impression   Pt given small amounts of thin liquids via single sips; as well as, other consistencies during BSE and pt did not exhibit any overt s/s of aspiration with any consistency; impaired mastication noted with solids d/t ill-fitting dentures, but given pt's inconsistent mentation, Dysphagia 3 diet recommended with thin via small sips, no straw with probable upgrade once mentation returns to baseline; ST f/u 1-2x for diet tolerance. Risk of aspiration is mild-moderate d/t recent neck sx and decreased mentation without use of swallowing strategies. Family/pt educated re: swallowing safety/importance of utilization of strategies during BSE.    Aspiration Risk  Mild aspiration risk    Diet Recommendation  Dysphagia 3/thin (via single sips); no straws   Medication Administration: Crushed  with puree    Other  Recommendations Oral Care Recommendations: Oral care BID   Follow up Recommendations  None    Frequency and Duration min 2x/week  1 week       Prognosis Prognosis for Safe Diet Advancement: Good      Swallow Study   General Date of Onset: 02/20/16 HPI: Pt is a 75 yo female with a hx of recent neck sx and now possible menengitis (being r/o); nursing noted coughing with liquids with morning medication; pt has been  lethargic and confused per family since symptoms started on 02/20/16; pt was transferred from [facility] on [facility]/20/16 Type of Study: Bedside Swallow Evaluation Diet Prior to this Study: NPO Temperature Spikes Noted: No Respiratory Status: Nasal cannula History of Recent Intubation: No Behavior/Cognition: Alert;Cooperative;Confused Oral Cavity Assessment: Within Functional Limits Oral Care Completed by SLP: Recent completion by staff Oral Cavity - Dentition: Dentures, top;Dentures, bottom (ill-fitting d/t no denture cream available) Vision: Functional for self-feeding Self-Feeding Abilities: Able to feed self;Needs assist Patient Positioning: Upright in bed Baseline Vocal Quality: Hoarse Volitional Cough: Weak Volitional Swallow: Able to elicit    Oral/Motor/Sensory Function Overall Oral Motor/Sensory Function: Within functional limits   Ice Chips Ice chips: Within functional limits   Thin Liquid Thin Liquid: Within functional limits (did not attempt straw; only small sips) Presentation: Cup    Nectar Thick Nectar Thick Liquid: Not tested   Honey Thick Honey Thick Liquid: Not tested   Puree Puree: Within functional limits Presentation: Spoon   Solid      Solid: Impaired Presentation: Self Fed Oral Phase Impairments: Impaired mastication Oral Phase Functional Implications: Prolonged oral transit    Functional Assessment Tool Used: NOMS Functional Limitations: Swallowing Swallow Current Status [id number]): At least 1 percent but less than 20 percent impaired, limited or restricted Swallow Goal Status [phone]): At least 1 percent but less than 20 percent impaired, limited or restricted   Nicki Gracy,PAT, M.S., CCC-SLP 02/21/2016,4:14 PM

## 2016-02-21 NOTE — H&P (Signed)
Admission H&P    Chief Complaint: Altered mental status.  HPI: Melinda Schroeder is an 75 y.o. female status post resection seen 1-II nerve sheath tumor resection and posterior C5-6 fusion on 02/11/2016, hypertension, COPD, hypercholesterolemia and coronary artery disease, transferred from [facility] for further management of acute stroke and meningitis. Patient presented with confusion and agitation as well as temperature 102.6. CT scan of her head showed probable acute right occipital infarction. Cervical CT scan showed edema and fluid posterior to cervical spine at the level of C5-6 and extending to the base of skull. Peripheral WBC count was greater than 25,000. Lumbar puncture was performed which showed pleocytosis, with to #1 showing 97 wbc's and 7 RBCs, and tube 4 showing 105 WBCs and 5 rbc's. CSF glucose was 57 and protein was 159. Differential showed greater than 90% lymphocytes. CSF culture, as well as blood and urine cultures, spending. She was started on Rocephin and vancomycin. Acyclovir was added following arrival here. She has remained obtunded and agitated when aroused.  Past Medical History  Diagnosis Date  . Diverticulitis   . Gastritis   . Hypercholesterolemia   . Hypertension   . Anxiety   . COPD (chronic obstructive pulmonary disease) ([facility])   . DDD (degenerative disc disease)   . IBS (irritable bowel syndrome)   . Esophageal dysmotilities   . Anginal pain ([facility])     years ago  . GERD (gastroesophageal reflux disease)   . Bronchitis, allergic     11-27-15 MD visit -2 injections given , oral Levaquin at present.  [name] Restless legs   . Coronary artery disease     stent placed 15 years ago Dr Riley Kill. Dr. Dortha Kern seen 2 yrs ago- no recent problems or follow up cardiology visits.  [name] PONV (postoperative nausea and vomiting)     nausea  . Shortness of breath dyspnea     Past Surgical History  Procedure Laterality Date  . Bladder surgery    . Breast mass right excision     . Hernia repair    . Coronary stent placement    . Appendectomy    . Shoulder sugery Left     rtc repair  . Anterior cervical decomp/discectomy fusion N/A 06/08/2013    Procedure: Cervical Four Five anterior cervical decompression with fusion plating and bonegraft;  Surgeon: Carmela Hurt, MD;  Location: [facility];  Service: Neurosurgery;  Laterality: N/A;  ANTERIOR CERVICAL DECOMPRESSION/DISCECTOMY FUSION 1 LEVEL  . Abdominal hysterectomy      complete  . Eus N/A 10/17/2014    Procedure: UPPER ENDOSCOPIC ULTRASOUND (EUS) LINEAR;  Surgeon: Rachael Fee, MD;  Location: [facility];  Service: Endoscopy;  Laterality: N/A;  . Eus N/A 12/04/2015    Procedure: UPPER ENDOSCOPIC ULTRASOUND (EUS) RADIAL;  Surgeon: Rachael Fee, MD;  Location: [facility];  Service: Endoscopy;  Laterality: N/A;  . Laminectomy Left 02/11/2016    Procedure: Left C1-2 Tumor resection/C5-6 Posterior cervical fusion with lateral mass fixation;  Surgeon: Coletta Memos, MD;  Location: [facility];  Service: Neurosurgery;  Laterality: Left;  Left C1-2 Tumor resection/C5-6 Posterior cervical fusion with lateral mass fixation     Family History  Problem Relation Age of Onset  . Emphysema Father   . Stomach cancer Brother   . Stroke Mother    Social History:  reports that she has been smoking Cigarettes.  She has a 50 pack-year smoking history. She has never used smokeless tobacco. She reports that she does  not drink alcohol or use illicit drugs.  Allergies:  Allergies  Allergen Reactions  . Ace Inhibitors Cough  . Sulfa Antibiotics Nausea Only    Medications Prior to Admission  Medication Sig Dispense Refill  . LORazepam (ATIVAN) 0.5 MG tablet Take 0.5 mg by mouth every 6 (six) hours as needed for anxiety.    [name] losartan-hydrochlorothiazide (HYZAAR) 100-12.5 MG per tablet Take 1 tablet by mouth every morning.     . metoprolol succinate (TOPROL-XL) 100 MG 24 hr tablet Take 100 mg by mouth 2 (two) times daily.  Take with or immediately following a meal.    . mirtazapine (REMERON) 15 MG tablet Take 15 mg by mouth at bedtime.    . nitroGLYCERIN (NITROSTAT) 0.4 MG SL tablet Place 0.4 mg under the tongue every 5 (five) minutes as needed for chest pain.    [name] omeprazole (PRILOSEC) 20 MG capsule Take 20 mg by mouth 2 (two) times daily before a meal.     . oxyCODONE-acetaminophen (PERCOCET/ROXICET) 5-325 MG tablet Take 1-2 tablets by mouth every 6 (six) hours as needed for moderate pain. 70 tablet 0  . PARoxetine (PAXIL) 20 MG tablet Take 20 mg by mouth every morning.     . potassium chloride (K-DUR,KLOR-CON) 10 MEQ tablet Take 10 mEq by mouth 2 (two) times daily.    . simvastatin (ZOCOR) 80 MG tablet Take 80 mg by mouth at bedtime.    [name] tiotropium (SPIRIVA) 18 MCG inhalation capsule Place 18 mcg into inhaler and inhale daily as needed (Shortness of breath).     [name] tiZANidine (ZANAFLEX) 4 MG tablet Take 1 tablet (4 mg total) by mouth every 6 (six) hours as needed for muscle spasms. 60 tablet 0    ROS: Unavailable due to patient's obtunded state.  Physical Examination: Blood pressure 178/131, pulse 115, temperature 101.3 F (38.5 C), temperature source Rectal, resp. rate 23, height 5\' 2"  (1.575 m), weight 48.3 kg (106 lb 7.7 oz), SpO2 100 %.  HEENT-  Normocephalic, no lesions, without obvious abnormality.  Normal external eye and conjunctiva.  Normal TM's bilaterally.  Normal auditory canals and external ears. Normal external nose, mucus membranes and septum.  Normal pharynx. Neck - moderate resistance to neck flexion. Cardiovascular - regular rate and rhythm, S1, S2 normal, no murmur, click, rub or gallop Lungs - chest clear, no wheezing, rales, normal symmetric air entry Abdomen - soft, non-tender; bowel sounds normal; no masses,  no organomegaly Extremities - no joint deformities, effusion, or inflammation  Neurologic Examination: Patient was obtunded and could only partially be aroused. She was  markedly agitated with external stimulation. There was no verbal output and she did not follow any commands. Pupils were equal and reacted normally to light. Extraocular movements were intact with oculocephalic maneuvers. Face was symmetrical. Patient moves extremities equally with normal symmetrical strength throughout. Muscle tone was normal at rest. Deep tendon reflexes were 2+ and symmetrical. Plantar responses were flexor.  Results for orders placed or performed during the hospital encounter of 02/20/16 (from the past 48 hour(s))  CBC with Differential     Status: Abnormal   Collection Time: 02/20/16 11:30 PM  Result Value Ref Range   WBC 19.2 (H) 4.0 - 10.5 K/uL   RBC 3.96 3.87 - 5.11 MIL/uL   Hemoglobin 11.1 (L) 12.0 - 15.0 g/dL   HCT 04/21/16 (L) 49.3 - 22.7 %   MCV 84.3 78.0 - 100.0 fL   MCH 28.0 26.0 - 34.0 pg   MCHC 33.2  30.0 - 36.0 g/dL   RDW 13.1 11.5 - 15.5 %   Platelets 420 (H) 150 - 400 K/uL   Neutrophils Relative % 72 %   Lymphocytes Relative 18 %   Monocytes Relative 10 %   Eosinophils Relative 0 %   Basophils Relative 0 %   Neutro Abs 13.8 (H) 1.7 - 7.7 K/uL   Lymphs Abs 3.5 0.7 - 4.0 K/uL   Monocytes Absolute 1.9 (H) 0.1 - 1.0 K/uL   Eosinophils Absolute 0.0 0.0 - 0.7 K/uL   Basophils Absolute 0.0 0.0 - 0.1 K/uL   RBC Morphology POLYCHROMASIA PRESENT    WBC Morphology TOXIC GRANULATION   Comprehensive metabolic panel     Status: Abnormal   Collection Time: 02/20/16 11:30 PM  Result Value Ref Range   Sodium 128 (L) 135 - 145 mmol/L   Potassium 2.1 (LL) 3.5 - 5.1 mmol/L    Comment: REPEATED TO VERIFY CRITICAL RESULT CALLED TO, READ BACK BY AND VERIFIED WITH: [facility] S,RN 02/21/16 0006 WAYK    Chloride 89 (L) 101 - 111 mmol/L   CO2 26 22 - 32 mmol/L   Glucose, Bld 130 (H) 65 - 99 mg/dL   BUN <5 (L) 6 - 20 mg/dL   Creatinine, Ser 0.55 0.44 - 1.00 mg/dL   Calcium 8.0 (L) 8.9 - 10.3 mg/dL   Total Protein 6.0 (L) 6.5 - 8.1 g/dL   Albumin 2.5 (L) 3.5 - 5.0  g/dL   AST 31 15 - 41 U/L   ALT 16 14 - 54 U/L   Alkaline Phosphatase 66 38 - 126 U/L   Total Bilirubin 0.9 0.3 - 1.2 mg/dL   GFR calc non Af Amer >60 >60 mL/min   GFR calc Af Amer >60 >60 mL/min    Comment: (NOTE) The eGFR has been calculated using the CKD EPI equation. This calculation has not been validated in all clinical situations. eGFR's persistently <60 mL/min signify possible Chronic Kidney Disease.    Anion gap 13 5 - 15  Lactic acid, plasma     Status: None   Collection Time: 02/20/16 11:30 PM  Result Value Ref Range   Lactic Acid, Venous 1.3 0.5 - 2.0 mmol/L  Protime-INR     Status: Abnormal   Collection Time: 02/20/16 11:30 PM  Result Value Ref Range   Prothrombin Time 15.5 (H) 11.6 - 15.2 seconds   INR 1.21 0.00 - 1.49  APTT     Status: None   Collection Time: 02/20/16 11:30 PM  Result Value Ref Range   aPTT 30 24 - 37 seconds   No results found.  Assessment/Plan 75 year old lady presenting with acute altered mental status, likely secondary to acute meningitis and possible sepsis, and to a lesser extent, acute stroke.  Recommendations: 1. Continue current broad-spectrum antibiotic coverage, as well as antiviral coverage with acyclovir 2. MRI and MRA of the brain 3. Carotid Doppler 4. 2-D echocardiogram 5. PT, OT and SLP consults when patient is more stable clinically 6. Aspirin daily, 300 mg rectal suppository for 325 mg orally 7. Hemoglobin A1c and fasting lipid panel  We will continue to follow this patient with you.  C.R. Nicole Kindred, MD [facility]  [phone]  02/21/2016, 12:08 AM

## 2016-02-21 NOTE — Progress Notes (Signed)
Patient ID: Melinda Schroeder, female   DOB: 05-14-1941, 75 y.o.   MRN: [id number] BP 139/54 mmHg  Pulse 92  Temp(Src) 97.6 F (36.4 C) (Oral)  Resp 17  Ht 5\' 2"  (1.575 m)  Wt 48.3 kg (106 lb 7.7 oz)  BMI 19.47 kg/m2  SpO2 100% Alert and oriented x 4, speech is clear and fluent Memory intact. Following all commands White count is decreased.  Wound is clean, dry CSF from [place] seemingly persuasive for meningitis, though this is unusual post surgery. Clearly she has improved with antibiotics in a brief 24 hours.  Will continue to follow. CT neck looks appropriate given operation on 5/31. Hardware in good location.

## 2016-02-21 NOTE — Progress Notes (Signed)
Pt arrived on the floor from [facility]. Pt opens her eyes to speech, not following command, GCS 8, Rectal temp 101.3, has bruises and skin tear on both arms, both legs, family at bedside updated, RRT was called to eval if pt was appropriate for stepdown, RRT and NP M lynch at bedside assessed the pt. MD R. Smith at bedside made aware of pt's vitals. PCCM has been made aware as well.

## 2016-02-22 ENCOUNTER — Inpatient Hospital Stay (HOSPITAL_COMMUNITY): Payer: Commercial Managed Care - HMO

## 2016-02-22 DIAGNOSIS — G039 Meningitis, unspecified: Secondary | ICD-10-CM

## 2016-02-22 DIAGNOSIS — I6789 Other cerebrovascular disease: Secondary | ICD-10-CM

## 2016-02-22 LAB — BASIC METABOLIC PANEL
Anion gap: 5 (ref 5–15)
BUN: <5 mg/dL — ABNORMAL LOW (ref 6–20)
CALCIUM: 7.4 mg/dL — AB (ref 8.9–10.3)
CO2: 28 mmol/L (ref 22–32)
CREATININE: 0.47 mg/dL (ref 0.44–1.00)
Chloride: 105 mmol/L (ref 101–111)
Glucose, Bld: 99 mg/dL (ref 65–99)
Potassium: 3.6 mmol/L (ref 3.5–5.1)
SODIUM: 138 mmol/L (ref 135–145)

## 2016-02-22 LAB — ECHOCARDIOGRAM COMPLETE
E decel time: 232 msec
EERAT: 15.93
FS: 25 % — AB (ref 28–44)
HEIGHTINCHES: 62 in
IV/PV OW: 1.33
LA diam index: 2.34 cm/m2
LA vol index: 40.2 mL/m2
LA vol: 58.3 mL
LASIZE: 34 mm
LAVOLA4C: 58.6 mL
LDCA: 2.84 cm2
LEFT ATRIUM END SYS DIAM: 34 mm
LV E/e' medial: 15.93
LV E/e'average: 15.93
LV TDI E'LATERAL: 6.53
LVELAT: 6.53 cm/s
LVOT diameter: 19 mm
Lateral S' vel: 16.2 cm/s
MV Dec: 232
MV pk A vel: 152 m/s
MVPG: 4 mmHg
MVPKEVEL: 104 m/s
PW: 6.79 mm — AB (ref 0.6–1.1)
TAPSE: 17.8 mm
TDI e' medial: 5.22
WEIGHTICAEL: 1703.71 [oz_av]

## 2016-02-22 MED ORDER — LORAZEPAM 2 MG/ML IJ SOLN
1.0000 mg | Freq: Once | INTRAMUSCULAR | Status: AC
  Administered 2016-02-22: 1 mg via INTRAVENOUS
  Filled 2016-02-22: qty 1

## 2016-02-22 MED ORDER — LORAZEPAM 2 MG/ML IJ SOLN
2.0000 mg | Freq: Once | INTRAMUSCULAR | Status: AC
  Administered 2016-02-22: 2 mg via INTRAVENOUS
  Filled 2016-02-22: qty 1

## 2016-02-22 NOTE — Progress Notes (Signed)
  Echocardiogram 2D Echocardiogram has been performed.  Melinda Schroeder 02/22/2016, 6:42 PM

## 2016-02-22 NOTE — Progress Notes (Signed)
PT Cancellation Note  Patient Details Name: Melinda Schroeder MRN: [id number] DOB: 1941/08/04   Cancelled Treatment:    Reason Eval/Treat Not Completed: Other (comment).  Per OT pt confused and lethargic.  PT will continue to follow acutely.  Collie Siad PT, DPT  Pager: [phone] Phone: [phone] 02/22/2016, 3:10 PM

## 2016-02-22 NOTE — Evaluation (Signed)
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     02/11/2016 s/p Left c2 nerve sheath Tumor resection via left C1, C2 laminectomy and C5-6 Posterior cervical fusion with lateral mass fixation. 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 She will benefit from acute OT with follow up OT on CIR to get back to PLOF.    Follow Up Recommendations  CIR    Equipment Recommendations   (TBD next visit)       Precautions / Restrictions Precautions Precautions: Fall;Cervical Restrictions Weight Bearing Restrictions: No      Mobility Bed Mobility Overal bed mobility: Needs Assistance Bed Mobility: Supine to Sit;Sit to Supine     Supine to sit: Total assist Sit to supine: Total assist           Balance Overall balance assessment: Needs assistance Sitting-balance support: No upper extremity supported;Feet supported Sitting balance-Leahy Scale: Zero Sitting balance - Comments: pt would only momentarily bring trunk forward to need less A for balance                                    ADL Overall ADL's : Needs assistance/impaired                                       General ADL Comments: currently total A due  to decreased cognition and arousal     Vision Additional Comments: unable to assess due to pt's tendency to keep eyes closed          Pertinent Vitals/Pain Pain Assessment: Faces Faces Pain Scale: Hurts little more Pain Location: neck as I assisted her up to sit EOB Pain Descriptors / Indicators: Grimacing;Moaning Pain Intervention(s): Monitored during session;Repositioned        Extremity/Trunk Assessment Upper Extremity Assessment Upper Extremity Assessment: Difficult to assess due to impaired cognition           Communication Communication Communication:  (lethargic)   Cognition Arousal/Alertness: Lethargic Behavior During Therapy: Flat affect Overall Cognitive Status: Impaired/Different from baseline Area of Impairment: Following commands;Safety/judgement       Following Commands:  (not attempting to follow 1 step commands) Safety/Judgement: Decreased awareness of safety;Decreased awareness of deficits                    Home Living Family/patient expects to be discharged to:: Private residence Living Arrangements: Spouse/significant other Available Help at Discharge: Family;Available 24 hours/day (including sons and daughter in laws) Type of Home: House Home Access: Stairs to enter  Home Equipment: None          Prior Functioning/Environment Level of Independence: Independent        Comments: had been independent at home since discharged from hospital on 6/6 2017    OT Diagnosis: Generalized weakness;Acute pain;Cognitive deficits   OT Problem List: Decreased strength;Decreased activity tolerance;Impaired balance (sitting and/or standing);Pain;Decreased safety awareness;Decreased cognition;Impaired UE functional use;Decreased knowledge of use of DME or AE   OT Treatment/Interventions: Self-care/ADL training;Patient/family education;Balance training;Therapeutic activities;DME and/or AE instruction;Cognitive  remediation/compensation    OT Goals(Current goals can be found in the care plan section) Acute Rehab OT Goals Patient Stated Goal: unable due to lethargy; however family are leaning towards wanting rehab due to pt's current level of function OT Goal Formulation: Patient unable to participate in goal setting Time For Goal Achievement: 03/07/16 Potential to Achieve Goals: Fair ADL Goals Pt Will Perform Grooming: with mod assist;sitting (1 task at EOB) Pt Will Transfer to Toilet: with mod assist;with +2 assist;bedside commode;stand pivot transfer;squat pivot transfer Additional ADL Goal #1: Pt will follow 1 step commands 50% of time Additional ADL Goal #2: Pt will be Mod A for in and OOB for basic ADLs  OT Frequency: Min 3X/week              End of Session    Activity Tolerance: Patient limited by lethargy Patient left: in bed;with call bell/phone within reach;with chair alarm set;with family/visitor present   Time: [id number] OT Time Calculation (min): 18 min Charges:  OT General Charges $OT Visit: 1 Procedure OT Evaluation $OT Eval Moderate Complexity: 1 Procedure  [name] [id number] 02/22/2016, 1:41 PM

## 2016-02-22 NOTE — Progress Notes (Signed)
Subjective: Some confusion overnight  Exam: Filed Vitals:   02/22/16 0600 02/22/16 0800  BP: 131/64 124/64  Pulse: 80 78  Temp:  98.6 F (37 C)  Resp: 22 21   Gen: In bed, NAD Resp: non-labored breathing, no acute distress Abd: soft, nt  Neuro: MS: She gives "someone's house" as where she is, does not recognize family members in the room. CN: Able to count fingers in all visual fields  Motor:moves all extremities spontaneously, will not lift either leg against gravity Sensory: Endorses symmetric to light touch in arms and legs  Pertinent Labs: 92% lymphs with 97 WBC from [place] RBC 7, protein 159  Impression: 75 year old female with what appears to be meningitis. She seems to be improving with antibiotic therapy, but is still quite delirious. There is also question of stroke on outside CT, MRI pending. Given the relatively sudden change in mental status, I will get an EEG as well, but I suspect this is simply related to her meningitis.  Recomendations: 1) MRI brain, MRA head and neck  2) Echo  3) if MRI confirms acute stroke, LDL target be less than 70  4) once able to work with PT, OT, would start therapy.  5) EEG 6) we will continue to follow   Roland Rack, MD [facility] [phone]  If 7pm- 7am, please page neurology on call as listed in [place].

## 2016-02-22 NOTE — Progress Notes (Signed)
PROGRESS NOTE    Melinda Schroeder  [id number] DOB: 1941-01-21 DOA: 02/20/2016 PCP: Laverna Rotz, NP    Brief Narrative:  Melinda Schroeder is a 75 y.o. female with medical history significant of COPD, HTN, HLD, GERD, anxiety presents with acute encephalopathy. 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 At baseline patient was able to complete all of her ADLs basically without assistance per family.  Per review of [facility] at [facility] patient was seen to be febrile up to 102.50F, tachycardic, and tachypneic. Sepsis protocol was initiated and she was given vancomycin and Rocephin. WBC count was noted to be 25.7. They obtain a LP which CSF fluid tube# 1 revealed WBCs 97, 92% lymphocytes, RBCs 7, glucose 57, protein 159. Tube 4 showed 105 WBCs, 5 RBCs. Could not find documentation on finding opening pressure. CSF fluid was sent for cultures and further studies. 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 Also, there was edema and fluid posterior to the cervical spine at the level of the posterior C5-6 fusion extending to the skull base.  Assessment & Plan:   Principal Problem:   Sepsis ([place]) Active Problems:   Acute encephalopathy   Meningitis   Hypokalemia  Suspected sepsis from meningitis with acute encephalopathy :with h/o of recent surgery.  Admitted to stepdown and started on broad spectrum antibiotics with vancomycin, ampicillin, rocephin and acyclovir, .  Csf cultures, blood cultures are pending.  Neurology and neurosurgery consulted and recommendations given.  MRI BRAIN today doesnot show any acute infarction. 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  Lactic acid normal.  MRA head pending.    Recent C5/C6 surgery with removal of neural shealth  tumour: Further management as per neuro surgery.   Hypokalemia replete as needed.  Repeat in am.    Leukocytosis:  Improving.    Hyponatremia;  Resolved.  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  Monitor, if persistently low, will get further work up for SIADH.     DVT prophylaxis: (/SCD's) Code Status: (Full) Family Communication: son at bedside.  Disposition Plan: pending further management, pending PT EVAL. CIR requested.    Consultants:   Neurosurgery   Neurology.    Procedures: MRI brain   MRA brain   Antimicrobials: vancomycin  Rocephin  Ampicillin  Acyclovir.    Subjective: No new complaints.   Objective: Filed Vitals:   02/22/16 0500 02/22/16 0600 02/22/16 0800 02/22/16 1158  BP: 135/60 131/64 124/64 132/64  Pulse: 78 80 78 82  Temp:   98.6 F (37 C) 97.8 F (36.6 C)  TempSrc:   Oral Axillary  Resp: 24 22 21 17   Height:      Weight:      SpO2: 100% 100% 100% 100%    Intake/Output Summary (Last 24 hours) at 02/22/16 1432 Last data filed at 02/22/16 0900  Gross per 24 hour  Intake 4378.8 ml  Output   2800 ml  Net 1578.8 ml   Filed Weights   02/20/16 2137  Weight: 48.3 kg (106 lb 7.7 oz)    Examination:  General exam: sleeping.  Respiratory system: Clear to auscultation. Respiratory effort normal. Cardiovascular system: S1 & S2 heard, RRR. No JVD, murmurs, rubs, gallops or clicks. No pedal edema. Gastrointestinal system: Abdomen is nondistended, soft  and nontender. No organomegaly or masses felt. Normal bowel sounds heard. Central nervous system: drowsy, no new focal deficits.  Extremities: Symmetric 5 x 5 power. Skin: No rashes, lesions or ulcers    Data Reviewed: I have personally reviewed following labs and imaging studies  CBC:  Recent Labs Lab 02/20/16 2330 02/21/16 0518  WBC 19.2* 13.2*  NEUTROABS 13.8*  --   HGB 11.1* 11.1*  HCT 33.4* 33.2*  MCV 84.3 85.6  PLT 420* [id number]*   Basic Metabolic  Panel:  Recent Labs Lab 02/20/16 2330 02/21/16 0518 02/21/16 2040 02/22/16 0515  NA 128* 129* 130* 138  K 2.1* 3.1* 2.5* 3.6  CL 89* 90* 94* 105  CO2 26 27 25 28   GLUCOSE 130* 162* 161* 99  BUN <5* <5* <5* <5*  CREATININE 0.55 0.52 0.58 0.47  CALCIUM 8.0* 7.9* 7.3* 7.4*   GFR: Estimated Creatinine Clearance: 47.8 mL/min (by C-G formula based on Cr of 0.47). Liver Function Tests:  Recent Labs Lab 02/20/16 2330 02/21/16 0518  AST 31 31  ALT 16 17  ALKPHOS 66 62  BILITOT 0.9 0.9  PROT 6.0* 5.7*  ALBUMIN 2.5* 2.4*   No results for input(s): LIPASE, AMYLASE in the last 168 hours. No results for input(s): AMMONIA in the last 168 hours. Coagulation Profile:  Recent Labs Lab 02/20/16 2330  INR 1.21   Cardiac Enzymes: No results for input(s): CKTOTAL, CKMB, CKMBINDEX, TROPONINI in the last 168 hours. BNP (last 3 results) No results for input(s): PROBNP in the last 8760 hours. HbA1C: No results for input(s): HGBA1C in the last 72 hours. CBG:  Recent Labs Lab 02/17/16 1628 02/21/16 0024 02/21/16 0444  GLUCAP 103* 156* 177*   Lipid Profile: No results for input(s): CHOL, HDL, LDLCALC, TRIG, CHOLHDL, LDLDIRECT in the last 72 hours. Thyroid Function Tests: No results for input(s): TSH, T4TOTAL, FREET4, T3FREE, THYROIDAB in the last 72 hours. Anemia Panel: No results for input(s): VITAMINB12, FOLATE, FERRITIN, TIBC, IRON, RETICCTPCT in the last 72 hours. Sepsis Labs:  Recent Labs Lab 02/20/16 2330 02/21/16 0519  PROCALCITON 0.72  --   LATICACIDVEN 1.3 0.8    Recent Results (from the past 240 hour(s))  Culture, blood (x 2)     Status: None (Preliminary result)   Collection Time: 02/20/16 10:36 PM  Result Value Ref Range Status   Specimen Description BLOOD LEFT ARM  Final   Special Requests BOTTLES DRAWN AEROBIC AND ANAEROBIC 5ML  Final   Culture NO GROWTH 2 DAYS  Final   Report Status PENDING  Incomplete  Culture, blood (x 2)     Status: None  (Preliminary result)   Collection Time: 02/20/16 10:53 PM  Result Value Ref Range Status   Specimen Description BLOOD RIGHT ARM  Final   Special Requests AEROBIC BOTTLE ONLY 5ML  Final   Culture NO GROWTH 2 DAYS  Final   Report Status PENDING  Incomplete  MRSA PCR Screening     Status: None   Collection Time: 02/20/16 10:57 PM  Result Value Ref Range Status   MRSA by PCR NEGATIVE NEGATIVE Final    Comment:        The GeneXpert MRSA Assay (FDA approved for NASAL specimens only), is one component of a comprehensive MRSA colonization surveillance program. It is not intended to diagnose MRSA infection nor to guide or monitor treatment for MRSA infections. Performed at [facility]          Radiology Studies: Mr Brain Wo Contrast  02/22/2016  CLINICAL DATA:  Altered mental status. Combative. Encephalitis versus is encephalopathy. EXAM: MRI HEAD WITHOUT CONTRAST TECHNIQUE: Multiplanar, multiecho pulse sequences of the brain and surrounding structures were obtained without intravenous contrast. COMPARISON:  CT 02/20/2016 FINDINGS: Motion degraded study. Diffusion imaging is of sufficient quality. Diffusion imaging does not show any acute or subacute infarction. There is a small amount of layering material in the occipital horns of both lateral ventricles which shows restricted diffusion. This could represent hemorrhage or conceivably other abnormal layering material such as protein or pus. No hydrocephalus. No extra-axial collection. Mild chronic small-vessel ischemic changes affect the cerebral hemispheric white matter. Question mild edema in the left occipital lobe without restricted diffusion. This could be seen in the setting of posterior reversible encephalopathy. IMPRESSION: Limited exam. No acute infarction. 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 Small amount of layering material in the occipital horns of both lateral ventricles that could be a small amount of  hemorrhage, proteinaceous material or pus. Question mild edema in the left occipital lobe without restricted diffusion. This could be seen in posterior reversible encephalopathy. Electronically Signed   By: Nelson Chimes M.D.   On: 02/22/2016 11:33        Scheduled Meds: . acyclovir  10 mg/kg Intravenous Q8H  . ampicillin (OMNIPEN) IV  1 g Intravenous Q6H  . antiseptic oral rinse  7 mL Mouth Rinse q12n4p  . atorvastatin  40 mg Oral q1800  . cefTRIAXone (ROCEPHIN)  IV  2 g Intravenous BID  . chlorhexidine  15 mL Mouth Rinse BID  . metoprolol succinate  100 mg Oral BID  . mirtazapine  15 mg Oral QHS  . pantoprazole  40 mg Oral Daily  . PARoxetine  20 mg Oral q morning - 10a  . sodium chloride flush  3 mL Intravenous Q12H  . vancomycin  1,000 mg Intravenous Q24H   Continuous Infusions: . sodium chloride 100 mL/hr at 02/22/16 0000     LOS: 2 days    Time spent: 35 minutes.     Hosie Poisson, MD [facility] Pager [phone]   If 7PM-7AM, please contact night-coverage [email or web address] Password [facility] 02/22/2016, 2:32 PM

## 2016-02-23 ENCOUNTER — Inpatient Hospital Stay (HOSPITAL_COMMUNITY): Payer: Commercial Managed Care - HMO

## 2016-02-23 DIAGNOSIS — D62 Acute posthemorrhagic anemia: Secondary | ICD-10-CM

## 2016-02-23 DIAGNOSIS — J449 Chronic obstructive pulmonary disease, unspecified: Secondary | ICD-10-CM

## 2016-02-23 DIAGNOSIS — I10 Essential (primary) hypertension: Secondary | ICD-10-CM | POA: Diagnosis present

## 2016-02-23 DIAGNOSIS — Z981 Arthrodesis status: Secondary | ICD-10-CM

## 2016-02-23 DIAGNOSIS — D72829 Elevated white blood cell count, unspecified: Secondary | ICD-10-CM

## 2016-02-23 DIAGNOSIS — A419 Sepsis, unspecified organism: Principal | ICD-10-CM

## 2016-02-23 DIAGNOSIS — G9340 Encephalopathy, unspecified: Secondary | ICD-10-CM

## 2016-02-23 DIAGNOSIS — D3610 Benign neoplasm of peripheral nerves and autonomic nervous system, unspecified: Secondary | ICD-10-CM

## 2016-02-23 DIAGNOSIS — R000 Tachycardia, unspecified: Secondary | ICD-10-CM

## 2016-02-23 DIAGNOSIS — R0682 Tachypnea, not elsewhere classified: Secondary | ICD-10-CM

## 2016-02-23 DIAGNOSIS — E876 Hypokalemia: Secondary | ICD-10-CM

## 2016-02-23 DIAGNOSIS — R410 Disorientation, unspecified: Secondary | ICD-10-CM

## 2016-02-23 DIAGNOSIS — R5381 Other malaise: Secondary | ICD-10-CM

## 2016-02-23 DIAGNOSIS — I2510 Atherosclerotic heart disease of native coronary artery without angina pectoris: Secondary | ICD-10-CM

## 2016-02-23 LAB — HEMOGLOBIN A1C
HEMOGLOBIN A1C: 5.7 % — AB (ref 4.8–5.6)
Mean Plasma Glucose: 117 mg/dL

## 2016-02-23 LAB — CBC
HEMATOCRIT: 30.1 % — AB (ref 36.0–46.0)
Hemoglobin: 9.6 g/dL — ABNORMAL LOW (ref 12.0–15.0)
MCH: 27.7 pg (ref 26.0–34.0)
MCHC: 31.9 g/dL (ref 30.0–36.0)
MCV: 87 fL (ref 78.0–100.0)
PLATELETS: 464 10*3/uL — AB (ref 150–400)
RBC: 3.46 MIL/uL — ABNORMAL LOW (ref 3.87–5.11)
RDW: 13.8 % (ref 11.5–15.5)
WBC: 15.6 10*3/uL — AB (ref 4.0–10.5)

## 2016-02-23 LAB — BASIC METABOLIC PANEL
Anion gap: 9 (ref 5–15)
CHLORIDE: 101 mmol/L (ref 101–111)
CO2: 28 mmol/L (ref 22–32)
CREATININE: 0.46 mg/dL (ref 0.44–1.00)
Calcium: 7.6 mg/dL — ABNORMAL LOW (ref 8.9–10.3)
GFR calc Af Amer: >60 mL/min (ref >60)
GFR calc non Af Amer: >60 mL/min (ref >60)
GLUCOSE: 112 mg/dL — AB (ref 65–99)
POTASSIUM: 2.6 mmol/L — AB (ref 3.5–5.1)
SODIUM: 138 mmol/L (ref 135–145)

## 2016-02-23 LAB — LIPID PANEL
Cholesterol: 138 mg/dL (ref 0–200)
HDL: 28 mg/dL — ABNORMAL LOW (ref >40)
LDL CALC: 77 mg/dL (ref 0–99)
Total CHOL/HDL Ratio: 4.9 RATIO
Triglycerides: 167 mg/dL — ABNORMAL HIGH (ref <150)
VLDL: 33 mg/dL (ref 0–40)

## 2016-02-23 LAB — POTASSIUM: POTASSIUM: 3.2 mmol/L — AB (ref 3.5–5.1)

## 2016-02-23 LAB — VANCOMYCIN, TROUGH: VANCOMYCIN TR: 7 ug/mL — AB (ref 10.0–20.0)

## 2016-02-23 LAB — MAGNESIUM: MAGNESIUM: 0.6 mg/dL — AB (ref 1.7–2.4)

## 2016-02-23 LAB — LDL CHOLESTEROL, DIRECT: Direct LDL: 88 mg/dL (ref 0–99)

## 2016-02-23 MED ORDER — POTASSIUM CHLORIDE CRYS ER 20 MEQ PO TBCR
40.0000 meq | EXTENDED_RELEASE_TABLET | Freq: Once | ORAL | Status: AC
  Administered 2016-02-23: 40 meq via ORAL
  Filled 2016-02-23: qty 2

## 2016-02-23 MED ORDER — HALOPERIDOL LACTATE 5 MG/ML IJ SOLN
2.0000 mg | Freq: Four times a day (QID) | INTRAMUSCULAR | Status: DC | PRN
  Administered 2016-02-24: 2 mg via INTRAVENOUS
  Filled 2016-02-23: qty 1

## 2016-02-23 MED ORDER — POTASSIUM CHLORIDE 10 MEQ/100ML IV SOLN
10.0000 meq | INTRAVENOUS | Status: AC
  Administered 2016-02-23 (×4): 10 meq via INTRAVENOUS
  Filled 2016-02-23 (×3): qty 100

## 2016-02-23 MED ORDER — MAGNESIUM SULFATE 4 GM/100ML IV SOLN
4.0000 g | Freq: Once | INTRAVENOUS | Status: AC
  Administered 2016-02-23: 4 g via INTRAVENOUS
  Filled 2016-02-23: qty 100

## 2016-02-23 MED ORDER — VANCOMYCIN HCL IN DEXTROSE 750-5 MG/150ML-% IV SOLN
750.0000 mg | Freq: Two times a day (BID) | INTRAVENOUS | Status: DC
  Administered 2016-02-24 – 2016-02-25 (×3): 750 mg via INTRAVENOUS
  Filled 2016-02-23 (×4): qty 150

## 2016-02-23 NOTE — Procedures (Signed)
ELECTROENCEPHALOGRAM REPORT  Date of Study: 02/23/2016  Patient's Name: Melinda Schroeder MRN: [id number] Date of Birth: [birth year]  Referring Provider: Hosie Poisson, MD  Indication: 75 year old woman with delirium, being treated for meningitis  Medications: acyclovir (ZOVIRAX) 485 mg in dextrose 5 % 100 mL IVPB ampicillin (OMNIPEN) 1 g in sodium chloride 0.9 % 50 mL IVPB atorvastatin (LIPITOR) tablet 40 mg cefTRIAXone (ROCEPHIN) 2 g in dextrose 5 % 50 mL IVPB haloperidol lactate (HALDOL) injection 2 mg LORazepam (ATIVAN) tablet 0.5 mg metoprolol succinate (TOPROL-XL) 24 hr tablet 100 mg mirtazapine (REMERON) tablet 15 mg ondansetron (ZOFRAN) tablet 4 mg oxyCODONE-acetaminophen (PERCOCET/ROXICET) 5-325 MG per tablet 1-2 tablet pantoprazole (PROTONIX) EC tablet 40 mg PARoxetine (PAXIL) tablet 20 mg vancomycin (VANCOCIN) IVPB 1000 mg/200 mL premix  Technical Summary: This is a multichannel digital EEG recording, using the international 10-20 placement system with electrodes applied with paste and impedances below 5000 ohms.    Description: The EEG background shows mild slowing and suppression but symmetric, with a posterior dominant rhythm of 10-11 Hz, which is reactive to eye opening and closing.  Diffuse beta activity is seen, with a bilateral frontal preponderance.  No focal or generalized abnormalities are seen.  No focal or generalized epileptiform discharges are seen.  Stage II sleep is not seen.  Hyperventilation and photic stimulation were not performed.  ECG with artifact but appears to exhibit normal rate and rhythm in more readable segments.  Impression: This EEG is mildly abnormal due to mild slowing and suppression of the EEG background, indicative of diffuse cerebral dysfunction.  This is a nonspecific finding that may be due to toxic-metabolic, infectious, or pharmacologic etiologies.  [name], DO

## 2016-02-23 NOTE — Progress Notes (Signed)
Subjective: Continued confusion.   Objective: Current vital signs: BP 158/50 mmHg  Pulse 93  Temp(Src) 97.7 F (36.5 C) (Oral)  Resp 20  Ht _0  (1.575 m)  Wt 48.3 kg (106 lb 7.7 oz)  BMI 19.47 kg/m2  SpO2 99% Vital signs in last 24 hours: Temp:  [96.9 F (36.1 C)-98.6 F (37 C)] 97.7 F (36.5 C) (06/12 1211) Pulse Rate:  [79-93] 93 (06/12 1211) Resp:  [13-28] 20 (06/12 1211) BP: (105-190)/(50-134) 158/50 mmHg (06/12 1211) SpO2:  [96 %-100 %] 99 % (06/12 1211)  Intake/Output from previous day: 06/11 0701 - 06/12 0700 In: 3929.1 [P.O.:720; I.V.:2380; IV Piggyback:829.1] Out: 3950 [Urine:3950] Intake/Output this shift: Total I/O In: 240 [P.O.:240] Out: 1100 [Urine:1100] Nutritional status: Diet Heart Room service appropriate?: Yes; Fluid consistency:: Thin  Neurologic Exam: Ment: Speech fluent with intact comprehension and naming. No dysarthria. Poor orientation. Mild drowsiness. No agitation noted. Picks repetitively at bedsheets during interview.  CN: PERRL. EOMI. No nystagmus. Tracks examiner appropriately. Face symmetric.  Motor: No asymmetry noted.  Cerebellar: No ataxia noted.  Gait: Deferred.   Lab Results: Results for orders placed or performed during the hospital encounter of 02/20/16 (from the past 48 hour(s))  Basic metabolic panel     Status: Abnormal   Collection Time: 02/21/16  8:40 PM  Result Value Ref Range   Sodium 130 (L) 135 - 145 mmol/L   Potassium 2.5 (LL) 3.5 - 5.1 mmol/L    Comment: CRITICAL RESULT CALLED TO, READ BACK BY AND VERIFIED WITH: GUFFEY A,RN 02/21/16 2120 WAYK    Chloride 94 (L) 101 - 111 mmol/L   CO2 25 22 - 32 mmol/L   Glucose, Bld 161 (H) 65 - 99 mg/dL   BUN <5 (L) 6 - 20 mg/dL   Creatinine, Ser 0.58 0.44 - 1.00 mg/dL   Calcium 7.3 (L) 8.9 - 10.3 mg/dL   GFR calc non Af Amer >60 >60 mL/min   GFR calc Af Amer >60 >60 mL/min    Comment: (NOTE) The eGFR has been calculated using the CKD EPI equation. This calculation has  not been validated in all clinical situations. eGFR's persistently <60 mL/min signify possible Chronic Kidney Disease.    Anion gap 11 5 - 15  Basic metabolic panel     Status: Abnormal   Collection Time: 02/22/16  5:15 AM  Result Value Ref Range   Sodium 138 135 - 145 mmol/L    Comment: DELTA CHECK NOTED   Potassium 3.6 3.5 - 5.1 mmol/L    Comment: DELTA CHECK NOTED   Chloride 105 101 - 111 mmol/L   CO2 28 22 - 32 mmol/L   Glucose, Bld 99 65 - 99 mg/dL   BUN <5 (L) 6 - 20 mg/dL   Creatinine, Ser 0.47 0.44 - 1.00 mg/dL   Calcium 7.4 (L) 8.9 - 10.3 mg/dL   GFR calc non Af Amer >60 >60 mL/min   GFR calc Af Amer >60 >60 mL/min    Comment: (NOTE) The eGFR has been calculated using the CKD EPI equation. This calculation has not been validated in all clinical situations. eGFR's persistently <60 mL/min signify possible Chronic Kidney Disease.    Anion gap 5 5 - 15  Hemoglobin A1c     Status: Abnormal   Collection Time: 02/22/16 11:45 AM  Result Value Ref Range   Hgb A1c MFr Bld 5.7 (H) 4.8 - 5.6 %    Comment: (NOTE)         Pre-diabetes: 5.7 -  6.4         Diabetes: >6.4         Glycemic control for adults with diabetes: <7.0    Mean Plasma Glucose 117 mg/dL    Comment: (NOTE) Performed At: [facility] [place], [place] [id number] Lindon Romp MD SH:[id number]   Basic metabolic panel     Status: Abnormal   Collection Time: 02/23/16  6:08 AM  Result Value Ref Range   Sodium 138 135 - 145 mmol/L   Potassium 2.6 (LL) 3.5 - 5.1 mmol/L    Comment: CRITICAL RESULT CALLED TO, READ BACK BY AND VERIFIED WITH: S.KHOKHOL,RN 02/23/16 0654 BY BSLADE    Chloride 101 101 - 111 mmol/L   CO2 28 22 - 32 mmol/L   Glucose, Bld 112 (H) 65 - 99 mg/dL   BUN <5 (L) 6 - 20 mg/dL   Creatinine, Ser 0.46 0.44 - 1.00 mg/dL   Calcium 7.6 (L) 8.9 - 10.3 mg/dL   GFR calc non Af Amer >60 >60 mL/min   GFR calc Af Amer >60 >60 mL/min    Comment: (NOTE) The eGFR has been  calculated using the CKD EPI equation. This calculation has not been validated in all clinical situations. eGFR's persistently <60 mL/min signify possible Chronic Kidney Disease.    Anion gap 9 5 - 15  CBC     Status: Abnormal   Collection Time: 02/23/16  6:08 AM  Result Value Ref Range   WBC 15.6 (H) 4.0 - 10.5 K/uL   RBC 3.46 (L) 3.87 - 5.11 MIL/uL   Hemoglobin 9.6 (L) 12.0 - 15.0 g/dL   HCT 30.1 (L) 36.0 - 46.0 %   MCV 87.0 78.0 - 100.0 fL   MCH 27.7 26.0 - 34.0 pg   MCHC 31.9 30.0 - 36.0 g/dL   RDW 13.8 11.5 - 15.5 %   Platelets 464 (H) 150 - 400 K/uL  Lipid panel     Status: Abnormal   Collection Time: 02/23/16  6:08 AM  Result Value Ref Range   Cholesterol 138 0 - 200 mg/dL   Triglycerides 167 (H) <150 mg/dL   HDL 28 (L) >40 mg/dL   Total CHOL/HDL Ratio 4.9 RATIO   VLDL 33 0 - 40 mg/dL   LDL Cholesterol 77 0 - 99 mg/dL    Comment:        Total Cholesterol/HDL:CHD Risk Coronary Heart Disease Risk Table                     Men   Women  1/2 Average Risk   3.4   3.3  Average Risk       5.0   4.4  2 X Average Risk   9.6   7.1  3 X Average Risk  23.4   11.0        Use the calculated Patient Ratio above and the CHD Risk Table to determine the patient's CHD Risk.        ATP III CLASSIFICATION (LDL):  <100     mg/dL   Optimal  100-129  mg/dL   Near or Above                    Optimal  130-159  mg/dL   Borderline  160-189  mg/dL   High  >190     mg/dL   Very High   Magnesium     Status: Abnormal   Collection Time: 02/23/16 11:57 AM  Result Value Ref Range   Magnesium 0.6 (LL) 1.7 - 2.4 mg/dL    Comment: CRITICAL RESULT CALLED TO, READ BACK BY AND VERIFIED WITHDarrol Jump [id number] WILDERK     Recent Results (from the past 240 hour(s))  Culture, blood (x 2)     Status: None (Preliminary result)   Collection Time: 02/20/16 10:36 PM  Result Value Ref Range Status   Specimen Description BLOOD LEFT ARM  Final   Special Requests BOTTLES DRAWN AEROBIC AND  ANAEROBIC 5ML  Final   Culture NO GROWTH 2 DAYS  Final   Report Status PENDING  Incomplete  Culture, blood (x 2)     Status: None (Preliminary result)   Collection Time: 02/20/16 10:53 PM  Result Value Ref Range Status   Specimen Description BLOOD RIGHT ARM  Final   Special Requests AEROBIC BOTTLE ONLY 5ML  Final   Culture NO GROWTH 2 DAYS  Final   Report Status PENDING  Incomplete  MRSA PCR Screening     Status: None   Collection Time: 02/20/16 10:57 PM  Result Value Ref Range Status   MRSA by PCR NEGATIVE NEGATIVE Final    Comment:        The GeneXpert MRSA Assay (FDA approved for NASAL specimens only), is one component of a comprehensive MRSA colonization surveillance program. It is not intended to diagnose MRSA infection nor to guide or monitor treatment for MRSA infections. Performed at [facility]     Lipid Panel  Recent Labs  02/23/16 0608  CHOL 138  TRIG 167*  HDL 28*  CHOLHDL 4.9  VLDL 33  LDLCALC 77    Studies/Results: Mr Brain Wo Contrast  02/22/2016  CLINICAL DATA:  Altered mental status. Combative. Encephalitis versus is encephalopathy. EXAM: MRI HEAD WITHOUT CONTRAST TECHNIQUE: Multiplanar, multiecho pulse sequences of the brain and surrounding structures were obtained without intravenous contrast. COMPARISON:  CT 02/20/2016 FINDINGS: Motion degraded study. Diffusion imaging is of sufficient quality. Diffusion imaging does not show any acute or subacute infarction. There is a small amount of layering material in the occipital horns of both lateral ventricles which shows restricted diffusion. This could represent hemorrhage or conceivably other abnormal layering material such as protein or pus. No hydrocephalus. No extra-axial collection. Mild chronic small-vessel ischemic changes affect the cerebral hemispheric white matter. Question mild edema in the left occipital lobe without restricted diffusion. This could be seen in the setting of  posterior reversible encephalopathy. IMPRESSION: Limited exam. No acute infarction. 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 Small amount of layering material in the occipital horns of both lateral ventricles that could be a small amount of hemorrhage, proteinaceous material or pus. Question mild edema in the left occipital lobe without restricted diffusion. This could be seen in posterior reversible encephalopathy. Electronically Signed   By: Nelson Chimes M.D.   On: 02/22/2016 11:33    Medications:   Current facility-administered medications:  .  0.9 %  sodium chloride infusion, , Intravenous, Continuous, Norval Morton, MD, Last Rate: 100 mL/hr at 02/22/16 2342 .  acetaminophen (TYLENOL) suppository 650 mg, 650 mg, Rectal, Q6H PRN, Ritta Slot, NP, 650 mg at 02/21/16 0026 .  acyclovir (ZOVIRAX) 485 mg in dextrose 5 % 100 mL IVPB, 10 mg/kg, Intravenous, Q8H, Darnell Level Mancheril, RPH, 485 mg at 02/23/16 0900 .  ampicillin (OMNIPEN) 1 g in sodium chloride 0.9 % 50 mL IVPB, 1 g, Intravenous, Q6H, Crystal S Robertson, RPH, 1 g at 02/23/16 1145 .  antiseptic oral rinse (CPC / CETYLPYRIDINIUM CHLORIDE 0.05%) solution 7 mL, 7 mL, Mouth Rinse, q12n4p, Rondell A Smith, MD, 7 mL at 02/23/16 1200 .  atorvastatin (LIPITOR) tablet 40 mg, 40 mg, Oral, q1800, Hosie Poisson, MD, 40 mg at 02/22/16 1749 .  cefTRIAXone (ROCEPHIN) 2 g in dextrose 5 % 50 mL IVPB, 2 g, Intravenous, BID, Norval Morton, MD, 2 g at 02/23/16 0540 .  chlorhexidine (PERIDEX) 0.12 % solution 15 mL, 15 mL, Mouth Rinse, BID, Rondell A Smith, MD, 15 mL at 02/23/16 1000 .  haloperidol lactate (HALDOL) injection 2 mg, 2 mg, Intravenous, Q6H PRN, Hosie Poisson, MD .  LORazepam (ATIVAN) tablet 0.5 mg, 0.5 mg, Oral, Q6H PRN, Hosie Poisson, MD, 0.5 mg at 02/23/16 0112 .  magnesium sulfate IVPB 4 g 100 mL, 4 g, Intravenous, Once, Hosie Poisson, MD .  metoprolol succinate (TOPROL-XL) 24 hr tablet 100 mg, 100 mg, Oral, BID, Hosie Poisson, MD, 100 mg at  02/23/16 1037 .  mirtazapine (REMERON) tablet 15 mg, 15 mg, Oral, QHS, Hosie Poisson, MD, 15 mg at 02/22/16 2109 .  ondansetron (ZOFRAN) tablet 4 mg, 4 mg, Oral, Q6H PRN **OR** ondansetron (ZOFRAN) injection 4 mg, 4 mg, Intravenous, Q6H PRN, Norval Morton, MD, 4 mg at 02/21/16 1248 .  oxyCODONE-acetaminophen (PERCOCET/ROXICET) 5-325 MG per tablet 1-2 tablet, 1-2 tablet, Oral, Q6H PRN, Hosie Poisson, MD, 2 tablet at 02/23/16 0112 .  pantoprazole (PROTONIX) EC tablet 40 mg, 40 mg, Oral, Daily, Hosie Poisson, MD, 40 mg at 02/23/16 1037 .  PARoxetine (PAXIL) tablet 20 mg, 20 mg, Oral, q morning - 10a, Hosie Poisson, MD, 20 mg at 02/23/16 1037 .  sodium chloride flush (NS) 0.9 % injection 3 mL, 3 mL, Intravenous, Q12H, Rondell A Smith, MD, 3 mL at 02/23/16 1000 .  tiotropium (SPIRIVA) inhalation capsule 18 mcg, 18 mcg, Inhalation, Daily PRN, Hosie Poisson, MD .  vancomycin (VANCOCIN) IVPB 1000 mg/200 mL premix, 1,000 mg, Intravenous, Q24H, Karren Cobble, RPH, 1,000 mg at 02/22/16 1735   Assessment/Plan:  Impression:  52. 75 year old female with what appears to be meningitis. She seems to be improving with antibiotic therapy, but is still delirious. Her AMS is most likely related to her meningitis; hospital delirium and sedating medications are also on the DDx.   2. Hypomagnesemia and hypokalemia. 3. MRI brain performed on 6/11 revealed a chronic right occipital lobe infarction, a small amount of layered material in the occipital horns most likely representing pus, as well as question of mild edema in the left occipital lobe, the latter finding being compatible with PRES.  4. TTE reveals no mural thrombus or vegetation.   Recomendations: 1. Correct magnesium and potassium levels. Continue to monitor electrolytes and correct as indicated.  2. MRA head and neck to assess for possible atherosclerotic disease in the context of her prior right occipital lobe stroke.   3. Once able to work with PT and  OT, would start therapy.  4. OOB to chair qd. Reorient frequently.  Maintain diurnal cycle: lights on and shades open during the day, lights off with no TV at night.  5. EEG completed, with official read pending.  6. Continue treatment with antibiotics.  7. Avoid Haldol and other medications with sedating effect.  8. BP control.  9. Start ASA 81 mg po qd for secondary stroke prevention, if repeat CT in 3 days (6/15) shows no evidence for intraventricular hemorrhage. .     LOS: 3 days   _0  signed: Dr.  Randall Hiss HAWUJNW@ 02/23/2016  12:58 PM

## 2016-02-23 NOTE — Progress Notes (Signed)
Critical value received on pt from Chemistry lab. Mg 0.6, spoke with  Dedra Skeens at 1216. Dr. Karleen Hampshire notified

## 2016-02-23 NOTE — Evaluation (Signed)
Speech Language Pathology Evaluation Patient Details Name: Melinda Schroeder MRN: [id number] DOB: 1941-02-23 Today's Date: 02/23/2016 Time: [id number] SLP Time Calculation (min) (ACUTE ONLY): 23 min  Problem List:  Patient Active Problem List   Diagnosis Date Noted  . Meningitis 02/21/2016  . Sepsis ([place]) 02/21/2016  . Hypokalemia 02/21/2016  . Acute encephalopathy 02/20/2016  . 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 Pulmonary nodule 12/21/2011  . COPD (chronic obstructive pulmonary disease) ([address]) 12/21/2011  . Hoarseness 12/21/2011  . Tobacco abuse 12/21/2011   Past Medical History:  Past Medical History  Diagnosis Date  . Diverticulitis   . Gastritis   . Hypercholesterolemia   . Hypertension   . Anxiety   . COPD (chronic obstructive pulmonary disease) ([place])   . DDD (degenerative disc disease)   . IBS (irritable bowel syndrome)   . Esophageal dysmotilities   . Anginal pain ([place])     years ago  . GERD (gastroesophageal reflux disease)   . Bronchitis, allergic     11-27-15 MD visit -2 injections given , oral Levaquin at present.  [name] Restless legs   . Coronary artery disease     stent placed 15 years ago Dr Lia Foyer. Dr. Steward Ros seen 2 yrs ago- no recent problems or follow up cardiology visits.  [name] PONV (postoperative nausea and vomiting)     nausea  . Shortness of breath dyspnea    Past Surgical History:  Past Surgical History  Procedure Laterality Date  . Bladder surgery    . Breast mass right excision    . Hernia repair    . Coronary stent placement    . Appendectomy    . Shoulder sugery Left     rtc repair  . Anterior cervical decomp/discectomy fusion N/A 06/08/2013    Procedure: Cervical Four Five anterior cervical decompression with fusion plating and bonegraft;  Surgeon: Winfield Cunas, MD;  Location: [facility];  Service: Neurosurgery;  Laterality: N/A;  ANTERIOR CERVICAL DECOMPRESSION/DISCECTOMY FUSION 1 LEVEL  . Abdominal hysterectomy      complete  . Eus N/A  10/17/2014    Procedure: UPPER ENDOSCOPIC ULTRASOUND (EUS) LINEAR;  Surgeon: Milus Banister, MD;  Location: [facility];  Service: Endoscopy;  Laterality: N/A;  . Eus N/A 12/04/2015    Procedure: UPPER ENDOSCOPIC ULTRASOUND (EUS) RADIAL;  Surgeon: Milus Banister, MD;  Location: [facility];  Service: Endoscopy;  Laterality: N/A;  . Laminectomy Left 02/11/2016    Procedure: Left C1-2 Tumor resection/C5-6 Posterior cervical fusion with lateral mass fixation;  Surgeon: Ashok Pall, MD;  Location: [facility];  Service: Neurosurgery;  Laterality: Left;  Left C1-2 Tumor resection/C5-6 Posterior cervical fusion with lateral mass fixation    HPI:  Pt is a 75 yo female with a hx of recent neck sx and now possible menengitis (being r/o); nursing noted coughing with liquids with morning medication; pt has been lethargic and confused per family since symptoms started on 02/20/16; pt was [facility] from [facility] on [facility]/16; having periods of hallucinations/disorientation per nursing/family; independent prior to recent neck sx.   Assessment / Plan / Recommendation Clinical Impression   Pt oriented to person, time and place, but not situation (stated neck sx) during SLE, but when another staff member asked at end of SLE, she was disoriented to place as well.  Pt having periods of hallucinations (i.e.: seeing spiders, feels as if she is falling when in bed, etc.) which interferes with overall function.  Auditory comprehension was difficult to assess d/t  decreased attention overall d/t confusion/hallucinations during SLE, but pt was able to follow up to 3-step directives with redirection from SLP/family.  Pt read simple information on white board, but full assessment of graphic expression was not completed; pt refused writing tasks; overall cognition is impaired, specifically with orientation and attention.  MRI head on 02/22/16 indicated encephalopathy vs encephalitis which may be contributing factor as pt was  independent prior to recent neck sx. Pt is verbal and naming does not appear to be affected with simple tasks (confrontational), but a complete assessment was not able to be completed d/t decreased attention overall.  Intelligible in complex conversation; ST will f/u to continue ongoing cognitive assessment and diet tolerance in lieu of cognitive changes for safety. Con't updated POC.    SLP Assessment  Patient needs continued Speech Language Pathology Services    Follow Up Recommendations  24 hour supervision/assistance    Frequency and Duration min 2x/week  1 week      SLP Evaluation Prior Functioning  Cognitive/Linguistic Baseline: Within functional limits Type of Home: House  Lives With: Spouse Available Help at Discharge: Family;Friend(s);Available 24 hours/day Vocation: Retired   Associate Professor  Overall Cognitive Status: Impaired/Different from baseline Orientation Level: Oriented to person;Oriented to place;Oriented to time;Disoriented to situation (Orientation fluctuates) Attention: Selective Sustained Attention: Appears intact Selective Attention: Impaired Selective Attention Impairment: Verbal complex;Functional complex Alternating Attention: Impaired Alternating Attention Impairment: Verbal complex;Functional complex Memory:  (difficult to assess) Awareness: Impaired Awareness Impairment: Anticipatory impairment Problem Solving:  (fluctuates) Behaviors: Restless;Other (comment) (fluctuates) Safety/Judgment: Impaired    Comprehension  Auditory Comprehension Overall Auditory Comprehension: Other (comment) (appears WFL, but d/t confusion, difficult to fully assess) Interfering Components: Attention;Other (comment) (confusion) EffectiveTechniques: Repetition;Visual/Gestural cues Visual Recognition/Discrimination Discrimination: Not tested Reading Comprehension Reading Status: Unable to assess (comment) (d/t decreased attention)    Expression Expression Primary Mode of  Expression: Verbal Verbal Expression Overall Verbal Expression: Appears within functional limits for tasks assessed Level of Generative/Spontaneous Verbalization: Conversation Naming: No impairment Interfering Components: Attention Non-Verbal Means of Communication: Not applicable Written Expression Dominant Hand: Right Written Expression: Unable to assess (comment) (d/t attention/confusion)   Oral / Motor  Oral Motor/Sensory Function Overall Oral Motor/Sensory Function: Within functional limits Motor Speech Overall Motor Speech: Appears within functional limits for tasks assessed Respiration: Within functional limits Phonation: Normal Resonance: Within functional limits Articulation: Within functional limitis Intelligibility: Intelligible Motor Planning: Witnin functional limits Motor Speech Errors: Not applicable Interfering Components: Inadequate dentition             Functional Assessment Tool Used: NOMS Functional Limitations: Attention Swallow Current Status [id number]): At least 1 percent but less than 20 percent impaired, limited or restricted Swallow Goal Status [id number]): 0 percent impaired, limited or restricted Attention Current Status [id number]): At least 40 percent but less than 60 percent impaired, limited or restricted Attention Goal Status [id number]): At least 1 percent but less than 20 percent impaired, limited or restricted         ADAMS,PAT, M.S., CCC-SLP 02/23/2016, 10:00 AM

## 2016-02-23 NOTE — Progress Notes (Signed)
Spoke to Admission nurse Izora Gala who is aware that admission documentaion needs to be completed.

## 2016-02-23 NOTE — Clinical Documentation Improvement (Signed)
Hospitalist Internal Medicine  A cause and effect relationship may not be assumed and must be documented by a provider.  Please clarify the relationship, if any, between Meningitis and recent C5-C6 surgery with removal of neural shealth tumor .  Are the conditions:   Due to or associated with each other  Unrelated to each other  Other  Clinically Undetermined   Supporting Information:  6/9 progress note: They obtain a LP which CSF fluid tube# 1 revealed WBCs 97, 92% lymphocytes, RBCs 7, glucose 57, protein 159. Tube 4 showed 105 WBCs, 5 RBCs. Could not find documentation on finding opening pressure. CSF fluid was sent for cultures and further studies.  6/10 progress note:  Sepsis 2/2 suspected meningitis with acute encephalopathy:Acute. 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  6/11 progress note:  Suspected sepsis from meningitis with acute encephalopathy :with h/o of recent surgery.  Admitted to stepdown and started on broad spectrum antibiotics with vancomycin, ampicillin, rocephin and acyclovir, .  Csf cultures, blood cultures are pending.  Treatment IV Zovirax IV Ampicillin IV Rocephin IV Vancomycin Pharmacy consult Droplet precautions  Please exercise your independent, professional judgment when responding. A specific answer is not anticipated or expected.   Thank You,  [place] [phone]

## 2016-02-23 NOTE — Progress Notes (Signed)
ANTIBIOTIC CONSULT NOTE - Follow Up Note   Pharmacy Consult for Vanco/Rocephin/Ampicillin/Acyclovir Indication: r/o Meningitis  Allergies  Allergen Reactions  . Ace Inhibitors Cough  . Sulfa Antibiotics Nausea Only    Patient Measurements: Height: 5\' 2"  (157.5 cm) Weight: 106 lb 7.7 oz (48.3 kg) IBW/kg (Calculated) : 50.1 Adjusted Body Weight:    Vital Signs: Temp: 98.2 F (36.8 C) (06/12 1532) Temp Source: Oral (06/12 1532) BP: 137/52 mmHg (06/12 1532) Pulse Rate: 88 (06/12 1532) Intake/Output from previous day: 06/11 0701 - 06/12 0700 In: 3929.1 [P.O.:720; I.V.:2380; IV Piggyback:829.1] Out: 3950 [Urine:3950] Intake/Output from this shift:    Labs:  Recent Labs  02/20/16 2330 02/21/16 0518 02/21/16 2040 02/22/16 0515 02/23/16 0608  WBC 19.2* 13.2*  --   --  15.6*  HGB 11.1* 11.1*  --   --  9.6*  PLT 420* 404*  --   --  464*  CREATININE 0.55 0.52 0.58 0.47 0.46   Estimated Creatinine Clearance: 47.8 mL/min (by C-G formula based on Cr of 0.46).  Recent Labs  02/23/16 1759  [place] 7*     Microbiology:   Medical History: Past Medical History  Diagnosis Date  . Diverticulitis   . Gastritis   . Hypercholesterolemia   . Hypertension   . Anxiety   . COPD (chronic obstructive pulmonary disease) ([place])   . DDD (degenerative disc disease)   . IBS (irritable bowel syndrome)   . Esophageal dysmotilities   . Anginal pain ([place])     years ago  . GERD (gastroesophageal reflux disease)   . Bronchitis, allergic     11-27-15 MD visit -2 injections given , oral Levaquin at present.  [name] Restless legs   . Coronary artery disease     stent placed 15 years ago Dr Lia Foyer. Dr. Steward Ros seen 2 yrs ago- no recent problems or follow up cardiology visits.  [name] PONV (postoperative nausea and vomiting)     nausea  . 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 She presented from [facility] with AMS and respiratory failure. Currently on Vancomycin, ampicillin, acyclovir and rocephin for r/o meningitis  -Vancomycin trough= 7  Rocephin 6/9>> Vanco 6/9 (huge load)>> Ampicillin 6/9>> Acyclovir 6/9>>  Goal of Therapy:  Vanco trough 15-20  Plan:  Change vancomycin to 750mg  IV q12h Continue Rocephin 2g IV q12h, Ampicillin 1g IV q6hr and Acyclovir 10mg /kg IV q8h Will follow patient progress  Hildred Laser, Pharm D 02/23/2016 7:48 PM

## 2016-02-23 NOTE — Clinical Documentation Improvement (Signed)
Hospitalist Internal Medicine  Can the diagnosis of encephalopathy be further specified in progress notes and discharge sumary   Metabolic encephalopathy  PRES  Other  Clinically Undetermined  Document any associated diagnoses/conditions.   Supporting Information:  6/9 progress note 75 y.o. female with medical history significant of COPD, HTN, HLD, GERD, anxiety; who presents after complaints of being acutely altered this morning. Sepsis 2/2 suspected meningitis with acute encephalopathy:Acute.  6/11 progress note: seems to be improving with antibiotic therapy, but is still quite delirious. There is also question of stroke on outside CT, MRI pending. Given the relatively sudden change in mental status, I will get an EEG as well, but I suspect this is simply related to her meningitis.   MRI Head w/o contrast: IMPRESSION: Limited exam. No acute infarction. 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 Small amount of layering material in the occipital horns of both lateral ventricles that could be a small amount of hemorrhage, proteinaceous material or pus. Question mild edema in the left occipital lobe without restricted diffusion. This could be seen in posterior reversible encephalopathy.  Treatment: IV abx Monitoring MRI Head   Please exercise your independent, professional judgment when responding. A specific answer is not anticipated or expected.   Thank You,  [place] [phone]

## 2016-02-23 NOTE — Progress Notes (Addendum)
PT Cancellation Note  Patient Details Name: Melinda Schroeder MRN: [id number] DOB: 08-Dec-1940   Cancelled Treatment:    Reason Eval/Treat Not Completed: Other (comment).  Pt has a R femoral line that was put in at [facility].  She will need this access removed/moved for PT/OT/RN staff to mobilize her safely without risk of kinking or breaking the line.  PT will check back in tomorrow.  Thanks,   Barbarann Ehlers. [place], [place], DPT [phone]   02/23/2016, 2:49 PM

## 2016-02-23 NOTE — Progress Notes (Addendum)
Pt was hallucinating through out night, very anxious, irritable and keep trying to get up by having her legs crossed over from side rails. Daughter at bedside for pt's safety.  Pt had one time episode of SVT when she was really agitated and moving around, rhythm was with artifact. Will cont to monitor pt.

## 2016-02-23 NOTE — Progress Notes (Signed)
Speech Language Pathology Treatment: Dysphagia  Patient Details Name: VALANDA GIRDLER MRN: [id number] DOB: 18-Feb-1941 Today's Date: 02/23/2016 Time: [id number] SLP Time Calculation (min) (ACUTE ONLY): 23 min  Assessment / Plan / Recommendation Clinical Impression  Pt with intermittent periods of disorientation and confusion in lieu of possible encephalopathy vs encephalitis per MRI head on 02/22/16; family reports independent prior to recent neck sx and caring for husband in the home; pt with periods of hallucinations (i.e.: spiders crawling on her, perceived falling while in bed) during session.  Pt consumed breakfast tray (regular/thin) without overt s/s of aspiration with swallowing precautions in place of small sips/bites, limiting distractions in room and slow rate.  SLE completed this date as well.  Con't POC.   HPI HPI: Pt is a 75 yo female with a hx of recent neck sx and now possible menengitis (being r/o); nursing noted coughing with liquids with morning medication; pt has been lethargic and confused per family since symptoms started on 02/20/16; pt was transferred from [facility] on 02/20/16      SLP Plan  Continue with current plan of care     Recommendations  Diet recommendations: Regular;Thin liquid Liquids provided via: Cup;No straw Medication Administration: Crushed with puree Supervision: Full supervision/cueing for compensatory strategies Compensations: Minimize environmental distractions;Slow rate;Small sips/bites Postural Changes and/or Swallow Maneuvers: Seated upright 90 degrees             Oral Care Recommendations: Oral care BID Follow up Recommendations: 24 hour supervision/assistance Plan: Continue with current plan of care                     Quavis Klutz,PAT, M.S., CCC-SLP 02/23/2016, 9:37 AM

## 2016-02-23 NOTE — Progress Notes (Signed)
CRITICAL VALUE ALERT  Critical value received:  K+ 2.6  Date of notification:  02/23/2016  Time of notification:  0655  Critical value read back: Yes  Nurse who received alert:  Eulis Foster RN  MD notified (1st page):  NP L.Harduk  Time of first page:  [phone]  MD notified (2nd page):  Time of second page:  Responding MD:  NP L Harduk  Time MD responded:  [phone]

## 2016-02-23 NOTE — Progress Notes (Signed)
PROGRESS NOTE    Melinda Schroeder  [id number] DOB: June 30, 1941 DOA: 02/20/2016 PCP: Laverna Ulibarri, NP    Brief Narrative:  Melinda Schroeder is a 75 y.o. female with medical history significant of COPD, HTN, HLD, GERD, anxiety presents with acute encephalopathy. 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 At baseline patient was able to complete all of her ADLs basically without assistance per family.  Per review of records at [facility] patient was seen to be febrile up to 102.20F, tachycardic, and tachypneic. Sepsis protocol was initiated and she was given vancomycin and Rocephin. WBC count was noted to be 25.7. They obtain a LP which CSF fluid tube# 1 revealed WBCs 97, 92% lymphocytes, RBCs 7, glucose 57, protein 159. Tube 4 showed 105 WBCs, 5 RBCs. Could not find documentation on finding opening pressure. CSF fluid was sent for cultures and further studies. 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 Also, there was edema and fluid posterior to the cervical spine at the level of the posterior C5-6 fusion extending to the skull base.  Assessment & Plan:   Principal Problem:   Sepsis ([place]) Active Problems:   Acute encephalopathy   Meningitis   Hypokalemia   Physical deconditioning   Acute delirium   Chronic obstructive pulmonary disease (HCC)   Essential hypertension   Coronary artery disease involving native coronary artery of native heart without angina pectoris   History of fusion of cervical spine   Schwannoma   Tachypnea   Tachycardia   Leukocytosis   Acute blood loss anemia  Suspected sepsis from meningitis with acute encephalopathy :with h/o of recent surgery.  Admitted to stepdown and started on broad spectrum antibiotics with vancomycin,  ampicillin, rocephin and acyclovir, .  Csf cultures, blood cultures are pending.  Neurology and neurosurgery consulted and recommendations given.  MRI BRAIN today doesnot show any acute infarction. 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  Lactic acid normal.  MRA head pending.  Called [facility] microbiology department requested the CSF cultures, and added on HSV pcr to be added on to the lab.  They asked me to call back tomorrow for results.    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   Hypokalemia and Hypomagnesemia: replete as needed.  Repeat in am.    Leukocytosis:  Improving.    Hyponatremia;  Resolved.  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  Monitor, if persistently low, will get further work up for SIADH.   Encephalopathy: possibly infectious/ meningitis/ metabolic from electrolyte abnormalities: - with hallucinations and delirium.  - probably hospital acq delirium too.   Hypertension:  Well controlled.  -     DVT prophylaxis: (/SCD's) Code Status: (Full) Family Communication: son at bedside.  Disposition Plan: pending further management, pending PT EVAL. CIR requested.    Consultants:   Neurosurgery   Neurology.   Psychiatry.   Procedures: MRI brain   MRA brain   Antimicrobials: vancomycin  Rocephin  Ampicillin  Acyclovir.    Subjective: No new complaints by thepatient.  But family reports she had hallucinations all night along.  Requesting psychiatry to evaluate.   Objective: Filed Vitals:   02/23/16 0400 02/23/16 0415 02/23/16 0600 02/23/16 0843  BP: 142/65  142/65  165/121  Pulse: 84 91 93 92  Temp: 97.5 F (36.4 C)   98.6 F (37 C)  TempSrc: Axillary   Oral  Resp: 23 27 20 18   Height:      Weight:      SpO2: 100% 100% 100% 100%    Intake/Output Summary (Last 24 hours) at 02/23/16 1125 Last data filed at 02/23/16 0900  Gross per 24 hour  Intake  3419.4 ml  Output   3800 ml  Net -380.6 ml   Filed Weights   02/20/16 2137  Weight: 48.3 kg (106 lb 7.7 oz)    Examination:  General exam: awake and lert and sitting in the chair, still confused.  Respiratory system: Clear to auscultation. Respiratory effort normal. Cardiovascular system: S1 & S2 heard, RRR. No JVD, murmurs, rubs, gallops or clicks. No pedal edema. Gastrointestinal system: Abdomen is nondistended, soft and nontender. No organomegaly or masses felt. Normal bowel sounds heard. Central nervous system: drowsy, no new focal deficits.  Extremities: Symmetric 5 x 5 power. Skin: No rashes, lesions or ulcers    Data Reviewed: I have personally reviewed following labs and imaging studies  CBC:  Recent Labs Lab 02/20/16 2330 02/21/16 0518 02/23/16 0608  WBC 19.2* 13.2* 15.6*  NEUTROABS 13.8*  --   --   HGB 11.1* 11.1* 9.6*  HCT 33.4* 33.2* 30.1*  MCV 84.3 85.6 87.0  PLT 420* 404* [id number]*   Basic Metabolic Panel:  Recent Labs Lab 02/20/16 2330 02/21/16 0518 02/21/16 2040 02/22/16 0515 02/23/16 0608  NA 128* 129* 130* 138 138  K 2.1* 3.1* 2.5* 3.6 2.6*  CL 89* 90* 94* 105 101  CO2 26 27 25 28 28   GLUCOSE 130* 162* 161* 99 112*  BUN <5* <5* <5* <5* <5*  CREATININE 0.55 0.52 0.58 0.47 0.46  CALCIUM 8.0* 7.9* 7.3* 7.4* 7.6*   GFR: Estimated Creatinine Clearance: 47.8 mL/min (by C-G formula based on Cr of 0.46). Liver Function Tests:  Recent Labs Lab 02/20/16 2330 02/21/16 0518  AST 31 31  ALT 16 17  ALKPHOS 66 62  BILITOT 0.9 0.9  PROT 6.0* 5.7*  ALBUMIN 2.5* 2.4*   No results for input(s): LIPASE, AMYLASE in the last 168 hours. No results for input(s): AMMONIA in the last 168 hours. Coagulation Profile:  Recent Labs Lab 02/20/16 2330  INR 1.21   Cardiac Enzymes: No results for input(s): CKTOTAL, CKMB, CKMBINDEX, TROPONINI in the last 168 hours. BNP (last 3 results) No results for input(s): PROBNP in the last 8760  hours. HbA1C:  Recent Labs  02/22/16 1145  HGBA1C 5.7*   CBG:  Recent Labs Lab 02/17/16 1628 02/21/16 0024 02/21/16 0444  GLUCAP 103* 156* 177*   Lipid Profile:  Recent Labs  02/23/16 0608  CHOL 138  HDL 28*  LDLCALC 77  TRIG 167*  CHOLHDL 4.9   Thyroid Function Tests: No results for input(s): TSH, T4TOTAL, FREET4, T3FREE, THYROIDAB in the last 72 hours. Anemia Panel: No results for input(s): VITAMINB12, FOLATE, FERRITIN, TIBC, IRON, RETICCTPCT in the last 72 hours. Sepsis Labs:  Recent Labs Lab 02/20/16 2330 02/21/16 0519  PROCALCITON 0.72  --   LATICACIDVEN 1.3 0.8    Recent Results (from the past 240 hour(s))  Culture, blood (x 2)     Status: None (Preliminary result)   Collection Time: 02/20/16 10:36 PM  Result Value Ref Range Status   Specimen Description BLOOD LEFT ARM  Final   Special Requests BOTTLES DRAWN AEROBIC AND ANAEROBIC  5ML  Final   Culture NO GROWTH 2 DAYS  Final   Report Status PENDING  Incomplete  Culture, blood (x 2)     Status: None (Preliminary result)   Collection Time: 02/20/16 10:53 PM  Result Value Ref Range Status   Specimen Description BLOOD RIGHT ARM  Final   Special Requests AEROBIC BOTTLE ONLY 5ML  Final   Culture NO GROWTH 2 DAYS  Final   Report Status PENDING  Incomplete  MRSA PCR Screening     Status: None   Collection Time: 02/20/16 10:57 PM  Result Value Ref Range Status   MRSA by PCR NEGATIVE NEGATIVE Final    Comment:        The GeneXpert MRSA Assay (FDA approved for NASAL specimens only), is one component of a comprehensive MRSA colonization surveillance program. It is not intended to diagnose MRSA infection nor to guide or monitor treatment for MRSA infections. Performed at [facility]          Radiology Studies: Mr Brain Wo Contrast  02/22/2016  CLINICAL DATA:  Altered mental status. Combative. Encephalitis versus is encephalopathy. EXAM: MRI HEAD WITHOUT CONTRAST TECHNIQUE:  Multiplanar, multiecho pulse sequences of the brain and surrounding structures were obtained without intravenous contrast. COMPARISON:  CT 02/20/2016 FINDINGS: Motion degraded study. Diffusion imaging is of sufficient quality. Diffusion imaging does not show any acute or subacute infarction. There is a small amount of layering material in the occipital horns of both lateral ventricles which shows restricted diffusion. This could represent hemorrhage or conceivably other abnormal layering material such as protein or pus. No hydrocephalus. No extra-axial collection. Mild chronic small-vessel ischemic changes affect the cerebral hemispheric white matter. Question mild edema in the left occipital lobe without restricted diffusion. This could be seen in the setting of posterior reversible encephalopathy. IMPRESSION: Limited exam. No acute infarction. 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 Small amount of layering material in the occipital horns of both lateral ventricles that could be a small amount of hemorrhage, proteinaceous material or pus. Question mild edema in the left occipital lobe without restricted diffusion. This could be seen in posterior reversible encephalopathy. Electronically Signed   By: Nelson Chimes M.D.   On: 02/22/2016 11:33        Scheduled Meds: . acyclovir  10 mg/kg Intravenous Q8H  . ampicillin (OMNIPEN) IV  1 g Intravenous Q6H  . antiseptic oral rinse  7 mL Mouth Rinse q12n4p  . atorvastatin  40 mg Oral q1800  . cefTRIAXone (ROCEPHIN)  IV  2 g Intravenous BID  . chlorhexidine  15 mL Mouth Rinse BID  . metoprolol succinate  100 mg Oral BID  . mirtazapine  15 mg Oral QHS  . pantoprazole  40 mg Oral Daily  . PARoxetine  20 mg Oral q morning - 10a  . potassium chloride  10 mEq Intravenous Q1 Hr x 4  . potassium chloride  40 mEq Oral Once  . sodium chloride flush  3 mL Intravenous Q12H  . vancomycin  1,000 mg Intravenous Q24H   Continuous Infusions: . sodium chloride 100 mL/hr  at 02/22/16 2342     LOS: 3 days    Time spent: 35 minutes.     Hosie Poisson, MD [facility] Pager [phone]   If 7PM-7AM, please contact night-coverage [email or web address] Password [facility] 02/23/2016, 11:25 AM

## 2016-02-23 NOTE — Consult Note (Signed)
Physical Medicine and Rehabilitation Consult  Reason for Consult: Meningitis vs. delirium   Referring Physician: Dr. Karleen Hampshire    HPI: Melinda Schroeder is a 75 y.o. 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 She was found to be septic with fever of 102.6 and CT cervical spine showed edema and fluid collection at C5/6 level. LP done showing leucocytosis with pleocytosis with 90% lymphocytes. 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 She was started on broad spectrum antibiotics and transferred to [facility]. Acyclovir added in ED and Dr Nicole Kindred recommended full work up with MRI/MRA.    Dr Christella Noa felt that CT neck showed post op changes but to continue antibiotics as patient seemed to be improving on antibiotics. MRI/MRA brain and no acute infarct seen with small amount of layering material in both occipital horns of lateral ventricle--question hemorrhage, proteinaceous material or pus and question mild edema left occipital lobe question PRES. Swallow evaluation without over s/s of aspiration and patient placed on dysphagia 3,thin liquids. Lethargy has resolved but cognition worsening with hallucinations, agitated and confused.  OT evaluation done yesterday and patient limited by lethargy, inability to follow one step commands and requires total assist for all mobility. CIR recommended by rehab team.    Family has been providing round the clock supervision since discharge 06/05 --daughter in law reports patient was independent with walker for ambulation and IADLs. Family expressing concerns about her "ammonia levels". Patient evaluated alongside neurology.   Review of Systems  HENT: Negative for hearing loss.   Eyes: Positive for blurred vision (has "very bad vision"--wears glasses. ).  Respiratory: Negative for cough and shortness of breath.     Cardiovascular: Negative for chest pain.  Gastrointestinal: Negative for heartburn, nausea and abdominal pain.  Musculoskeletal: Positive for back pain. Negative for myalgias.  Neurological: Positive for dizziness, weakness and headaches.  Psychiatric/Behavioral: Positive for memory loss.  All other systems reviewed and are negative.     Past Medical History  Diagnosis Date  . Diverticulitis   . Gastritis   . Hypercholesterolemia   . Hypertension   . Anxiety   . COPD (chronic obstructive pulmonary disease) ([place])   . DDD (degenerative disc disease)   . IBS (irritable bowel syndrome)   . Esophageal dysmotilities   . Anginal pain ([place])     years ago  . GERD (gastroesophageal reflux disease)   . Bronchitis, allergic     11-27-15 MD visit -2 injections given , oral Levaquin at present.  [name] Restless legs   . Coronary artery disease     stent placed 15 years ago Dr Lia Foyer. Dr. Steward Ros seen 2 yrs ago- no recent problems or follow up cardiology visits.  [name] PONV (postoperative nausea and vomiting)     nausea  . Shortness of breath dyspnea     Past Surgical History  Procedure Laterality Date  . Bladder surgery    . Breast mass right excision    . Hernia repair    . Coronary stent placement    . Appendectomy    . Shoulder sugery Left     rtc repair  . Anterior cervical decomp/discectomy fusion N/A 06/08/2013    Procedure: Cervical Four Five anterior cervical decompression with fusion plating and bonegraft;  Surgeon: Winfield Cunas, MD;  Location: [place] [facility];  Service: Neurosurgery;  Laterality: N/A;  ANTERIOR CERVICAL DECOMPRESSION/DISCECTOMY FUSION 1 LEVEL  . Abdominal hysterectomy      complete  . Eus N/A 10/17/2014    Procedure: UPPER ENDOSCOPIC ULTRASOUND (EUS) LINEAR;  Surgeon: Milus Banister, MD;  Location: [facility] ENDOSCOPY;  Service: Endoscopy;  Laterality: N/A;  . Eus N/A 12/04/2015    Procedure: UPPER ENDOSCOPIC ULTRASOUND (EUS) RADIAL;  Surgeon: Milus Banister, MD;   Location: [facility] ENDOSCOPY;  Service: Endoscopy;  Laterality: N/A;  . Laminectomy Left 02/11/2016    Procedure: Left C1-2 Tumor resection/C5-6 Posterior cervical fusion with lateral mass fixation;  Surgeon: Ashok Pall, MD;  Location: [place] [facility];  Service: Neurosurgery;  Laterality: Left;  Left C1-2 Tumor resection/C5-6 Posterior cervical fusion with lateral mass fixation     Family History  Problem Relation Age of Onset  . Emphysema Father   . Stomach cancer Brother   . Stroke Mother     Social History: Lives with family. Independent prior to surgery and was at supervision lvel at discharge on 06/06. Per reports that she has been smoking Cigarettes.  She has a 50 pack-year smoking history. She has never used smokeless tobacco. Per reports that she does not drink alcohol or use illicit drugs.   Allergies  Allergen Reactions  . Ace Inhibitors Cough  . Sulfa Antibiotics Nausea Only    Medications Prior to Admission  Medication Sig Dispense Refill  . LORazepam (ATIVAN) 0.5 MG tablet Take 0.5 mg by mouth every 6 (six) hours as needed for anxiety.    [name] losartan-hydrochlorothiazide (HYZAAR) 100-12.5 MG per tablet Take 1 tablet by mouth every morning.     . metoprolol succinate (TOPROL-XL) 100 MG 24 hr tablet Take 100 mg by mouth 2 (two) times daily. Take with or immediately following a meal.    . mirtazapine (REMERON) 15 MG tablet Take 15 mg by mouth at bedtime.    [name] omeprazole (PRILOSEC) 20 MG capsule Take 20 mg by mouth 2 (two) times daily before a meal.     . oxyCODONE-acetaminophen (PERCOCET/ROXICET) 5-325 MG tablet Take 1-2 tablets by mouth every 6 (six) hours as needed for moderate pain. 70 tablet 0  . PARoxetine (PAXIL) 20 MG tablet Take 20 mg by mouth every morning.     . potassium chloride (K-DUR,KLOR-CON) 10 MEQ tablet Take 10 mEq by mouth 2 (two) times daily.    . simvastatin (ZOCOR) 80 MG tablet Take 80 mg by mouth at bedtime.    [name] tiotropium (SPIRIVA) 18 MCG inhalation capsule  Place 18 mcg into inhaler and inhale daily as needed (Shortness of breath).     [name] tiZANidine (ZANAFLEX) 4 MG tablet Take 1 tablet (4 mg total) by mouth every 6 (six) hours as needed for muscle spasms. 60 tablet 0  . nitroGLYCERIN (NITROSTAT) 0.4 MG SL tablet Place 0.4 mg under the tongue every 5 (five) minutes as needed for chest pain.      Home: Home Living Family/patient expects to be discharged to:: Private residence Living Arrangements: Spouse/significant other Available Help at Discharge: Family, Available 24 hours/day (including sons and daughter in laws) Type of Home: House Home Access: Stairs to enter [place]: None  Functional History: Prior Function Level of Independence: Independent Comments: had been independent at home since discharged from hospital on 6/6 2017 Functional Status:  Mobility: Bed Mobility Overal bed mobility: Needs Assistance Bed Mobility: Supine to Sit, Sit to Supine Supine to sit: Total assist Sit to supine: Total assist  ADL: ADL Overall ADL's : Needs assistance/impaired General ADL Comments: currently total A due to decreased cognition and arousal  Cognition: Cognition Overall Cognitive Status: Impaired/Different from baseline Orientation Level: Oriented to person, Disoriented to situation, Disoriented to place, Disoriented to time Cognition Arousal/Alertness: Lethargic Behavior During Therapy: Flat affect Overall Cognitive Status: Impaired/Different from baseline Area of Impairment: Following commands, Safety/judgement Following Commands:  (not attempting to follow 1 step commands) Safety/Judgement: Decreased awareness of safety, Decreased awareness of deficits    Blood pressure 142/65, pulse 93, temperature 97.5 F (36.4 C), temperature source Axillary, resp. rate 20, height 5\' 2"  (1.575 m), weight 48.3 kg (106 lb 7.7 oz), SpO2 100 %. Physical Exam  Nursing note and vitals reviewed. Constitutional: She appears  well-developed and well-nourished.  Appears older than state age and frail appearing. Up in bed being fed breakfast by family.   HENT:  Head: Normocephalic and atraumatic.  Mouth/Throat: Oropharynx is clear and moist.  Edentulous.   Eyes: EOM are normal. Pupils are equal, round, and reactive to light.  Neck: Normal range of motion. Neck supple.  Cardiovascular: Regular rhythm.   Tachycardic  Respiratory: Effort normal. No respiratory distress. She has no wheezes. She has rhonchi. She exhibits no tenderness.  GI: Soft. Bowel sounds are normal. There is no tenderness.  Genitourinary:  Foley in place  Musculoskeletal: She exhibits no edema or tenderness.  Neurological: She is alert. She is disoriented.  Oriented to self only. Slurred speech. Cognition fluctuated with language of confusion/jibberish at times. She was able to point to items  but had difficulty following simple motor commands--very delayed and internally distracted.  Motor: Greater than or equal to 4/5 grossly throughout (? Effort) HOH  Skin: Skin is warm and dry.  Diffuse ecchymosis bilateral forearms and right shin.   Psychiatric:  Altered    Results for orders placed or performed during the hospital encounter of 02/20/16 (from the past 24 hour(s))  Basic metabolic panel     Status: Abnormal   Collection Time: 02/23/16  6:08 AM  Result Value Ref Range   Sodium 138 135 - 145 mmol/L   Potassium 2.6 (LL) 3.5 - 5.1 mmol/L   Chloride 101 101 - 111 mmol/L   CO2 28 22 - 32 mmol/L   Glucose, Bld 112 (H) 65 - 99 mg/dL   BUN <5 (L) 6 - 20 mg/dL   Creatinine, Ser 0.46 0.44 - 1.00 mg/dL   Calcium 7.6 (L) 8.9 - 10.3 mg/dL   GFR calc non Af Amer >60 >60 mL/min   GFR calc Af Amer >60 >60 mL/min   Anion gap 9 5 - 15  CBC     Status: Abnormal   Collection Time: 02/23/16  6:08 AM  Result Value Ref Range   WBC 15.6 (H) 4.0 - 10.5 K/uL   RBC 3.46 (L) 3.87 - 5.11 MIL/uL   Hemoglobin 9.6 (L) 12.0 - 15.0 g/dL   HCT 30.1 (L) 36.0 -  46.0 %   MCV 87.0 78.0 - 100.0 fL   MCH 27.7 26.0 - 34.0 pg   MCHC 31.9 30.0 - 36.0 g/dL   RDW 13.8 11.5 - 15.5 %   Platelets 464 (H) 150 - 400 K/uL  Lipid panel     Status: Abnormal   Collection Time: 02/23/16  6:08 AM  Result Value Ref Range   Cholesterol 138 0 - 200 mg/dL   Triglycerides 167 (H) <150 mg/dL   HDL 28 (L) >40 mg/dL   Total CHOL/HDL Ratio 4.9  RATIO   VLDL 33 0 - 40 mg/dL   LDL Cholesterol 77 0 - 99 mg/dL   Mr Brain Wo Contrast  02/22/2016  CLINICAL DATA:  Altered mental status. Combative. Encephalitis versus is encephalopathy. EXAM: MRI HEAD WITHOUT CONTRAST TECHNIQUE: Multiplanar, multiecho pulse sequences of the brain and surrounding structures were obtained without intravenous contrast. COMPARISON:  CT 02/20/2016 FINDINGS: Motion degraded study. Diffusion imaging is of sufficient quality. Diffusion imaging does not show any acute or subacute infarction. There is a small amount of layering material in the occipital horns of both lateral ventricles which shows restricted diffusion. This could represent hemorrhage or conceivably other abnormal layering material such as protein or pus. No hydrocephalus. No extra-axial collection. Mild chronic small-vessel ischemic changes affect the cerebral hemispheric white matter. Question mild edema in the left occipital lobe without restricted diffusion. This could be seen in the setting of posterior reversible encephalopathy. IMPRESSION: Limited exam. No acute infarction. 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 Small amount of layering material in the occipital horns of both lateral ventricles that could be a small amount of hemorrhage, proteinaceous material or pus. Question mild edema in the left occipital lobe without restricted diffusion. This could be seen in posterior reversible encephalopathy. Electronically Signed   By: Nelson Chimes M.D.   On: 02/22/2016 11:33    Assessment/Plan: Diagnosis: Meningitis vs. Delirium Labs and images  independently reviewed.  Records reviewed and summated above.  1. Does the need for close, 24 hr/day medical supervision in concert with the patient's rehab needs make it unreasonable for this patient to be served in a less intensive setting? Potentially 2. 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 Due to bladder management, safety, skin/wound care, disease management, medication administration, pain management and patient education, does the patient require 24 hr/day rehab nursing? Yes 4. Does the patient require coordinated care of a physician, rehab nurse, PT (1-2 hrs/day, 5 days/week), OT (1-2 hrs/day, 5 days/week) and SLP (1-2 hrs/day, 5 days/week) to address physical and functional deficits in the context of the above medical diagnosis(es)? Yes Addressing deficits in the following areas: balance, endurance, locomotion, strength, transferring, bowel/bladder control, feeding, toileting, cognition, language, swallowing and psychosocial support 5. Can the patient actively participate in an intensive therapy program of at least 3 hrs of therapy per day at least 5 days per week? Potentially 6. The potential for patient to make measurable gains while on inpatient rehab is good 7. Anticipated functional outcomes upon  discharge from inpatient rehab are TBD  with PT, supervision with OT, modified independent and supervision with SLP. 8. Estimated rehab length of stay to reach the above functional goals is: 7-10 days. 9. Does the patient have adequate social supports and living environment to accommodate these discharge functional goals? Yes 10. Anticipated D/C setting: Home 11. Anticipated post D/C treatments: HH therapy and Home excercise program 12. Overall Rehab/Functional Prognosis: excellent  RECOMMENDATIONS: This patient's condition is appropriate for continued rehabilitative care  in the following setting: Patient with? Delirium. Would anticipate significant improvement in functional status with improvement in cognition. Will also need to await formal consult by PT. At this point, recommend home with home health, however, will continue to follow as symptoms improve. Patient has agreed to participate in recommended program. Potentially Note that insurance prior authorization may be required for reimbursement for recommended care.  Comment: Rehab Admissions Coordinator to follow up.  Delice Lesch, MD 02/23/2016

## 2016-02-23 NOTE — Progress Notes (Signed)
Rehab admissions - Awaiting PT evaluation and recommendations.  Will then follow up to determine venue of care needed.  Call me for questions.  [phone]

## 2016-02-23 NOTE — Progress Notes (Signed)
Rehab Admissions Coordinator Note:  Patient was screened by Retta Diones for appropriateness for an Inpatient Acute Rehab Consult.  At this time, we are recommending Inpatient Rehab consult.  Retta Diones 02/23/2016, 8:19 AM  I can be reached at [phone].

## 2016-02-23 NOTE — Progress Notes (Signed)
EEG completed, results pending. 

## 2016-02-24 ENCOUNTER — Encounter (HOSPITAL_COMMUNITY): Payer: Self-pay | Admitting: General Practice

## 2016-02-24 LAB — CBC
HCT: 38.3 % (ref 36.0–46.0)
HEMOGLOBIN: 12.3 g/dL (ref 12.0–15.0)
MCH: 28.2 pg (ref 26.0–34.0)
MCHC: 32.4 g/dL (ref 30.0–36.0)
MCV: 87.2 fL (ref 78.0–100.0)
Platelets: 336 10*3/uL (ref 150–400)
RBC: 4.39 MIL/uL (ref 3.87–5.11)
RDW: 13.9 % (ref 11.5–15.5)
WBC: 10.2 10*3/uL (ref 4.0–10.5)

## 2016-02-24 LAB — BASIC METABOLIC PANEL
ANION GAP: 8 (ref 5–15)
BUN: <5 mg/dL — ABNORMAL LOW (ref 6–20)
CHLORIDE: 99 mmol/L — AB (ref 101–111)
CO2: 28 mmol/L (ref 22–32)
Calcium: 7 mg/dL — ABNORMAL LOW (ref 8.9–10.3)
Creatinine, Ser: 0.35 mg/dL — ABNORMAL LOW (ref 0.44–1.00)
GFR calc non Af Amer: >60 mL/min (ref >60)
Glucose, Bld: 95 mg/dL (ref 65–99)
POTASSIUM: 3 mmol/L — AB (ref 3.5–5.1)
Sodium: 135 mmol/L (ref 135–145)

## 2016-02-24 LAB — MAGNESIUM: Magnesium: 1.7 mg/dL (ref 1.7–2.4)

## 2016-02-24 MED ORDER — MAGNESIUM SULFATE 2 GM/50ML IV SOLN
2.0000 g | Freq: Once | INTRAVENOUS | Status: AC
  Administered 2016-02-24: 2 g via INTRAVENOUS
  Filled 2016-02-24: qty 50

## 2016-02-24 MED ORDER — POTASSIUM CHLORIDE 10 MEQ/100ML IV SOLN
10.0000 meq | INTRAVENOUS | Status: AC
  Administered 2016-02-24 (×3): 10 meq via INTRAVENOUS
  Filled 2016-02-24 (×3): qty 100

## 2016-02-24 MED ORDER — POTASSIUM CHLORIDE CRYS ER 20 MEQ PO TBCR
40.0000 meq | EXTENDED_RELEASE_TABLET | Freq: Two times a day (BID) | ORAL | Status: AC
  Administered 2016-02-24 (×2): 40 meq via ORAL
  Filled 2016-02-24 (×2): qty 2

## 2016-02-24 NOTE — Progress Notes (Signed)
PT Cancellation Note  Patient Details Name: Melinda Schroeder MRN: [id number] DOB: 10-06-1940   Cancelled Treatment:    Reason Eval/Treat Not Completed: Medical issues which prohibited therapy.  Pt still w/ femoral a-line.  Pt asleep and per family pt has not slept for several days.   Collie Siad PT, DPT  Pager: [phone] Phone: [phone] 02/24/2016, 8:27 AM

## 2016-02-24 NOTE — Progress Notes (Signed)
OT Cancellation Note  Patient Details Name: Melinda Schroeder MRN: [id number] DOB: June 27, 1941   Cancelled Treatment:    Reason Eval/Treat Not Completed: Other (comment). Pt sleeping soundly and family says she has not slept since Saturday. In addition per RN and family pt still has femoral line in place however in chart there is a blank next to femoral line v. Number of days in--unable to visually check at this time due to pt sleeping.  [name] [id number] 02/24/2016, 8:32 AM

## 2016-02-24 NOTE — Progress Notes (Signed)
Subjective: Some hallucinations and insomnia overnight.   Objective: Current vital signs: BP 129/53 mmHg  Pulse 70  Temp(Src) 97 F (36.1 C) (Axillary)  Resp 16  Ht 5\' 2"  (1.575 m)  Wt 48.3 kg (106 lb 7.7 oz)  BMI 19.47 kg/m2  SpO2 98% Vital signs in last 24 hours: Temp:  [97 F (36.1 C)-98.2 F (36.8 C)] 97 F (36.1 C) (06/13 0700) Pulse Rate:  [70-95] 70 (06/13 0813) Resp:  [16-27] 16 (06/13 0813) BP: (100-178)/(50-88) 129/53 mmHg (06/13 0813) SpO2:  [97 %-100 %] 98 % (06/13 0813)  Intake/Output from previous day: 06/12 0701 - 06/13 0700 In: 2370 [P.O.:720; I.V.:1200; IV Piggyback:450] Out: 2850 [Urine:2850] Intake/Output this shift: Total I/O In: -  Out: 800 [Urine:800] Nutritional status: Diet Heart Room service appropriate?: Yes; Fluid consistency:: Thin  Neurologic Exam: Ment: Speech fluent with intact comprehension; unchanged. No dysarthria. Poor orientation. Drowsy. No agitation noted.  CN: Face symmetric. No hoarseness or hypophonia.  Motor: No asymmetry noted.  Gait: Deferred.   Lab Results: Basic Metabolic Panel:  Recent Labs Lab 02/21/16 0518 02/21/16 2040 02/22/16 0515 02/23/16 0608 02/23/16 1157 02/23/16 2225 02/24/16 0500  NA 129* 130* 138 138  --   --  135  K 3.1* 2.5* 3.6 2.6*  --  3.2* 3.0*  CL 90* 94* 105 101  --   --  99*  CO2 27 25 28 28   --   --  28  GLUCOSE 162* 161* 99 112*  --   --  95  BUN <5* <5* <5* <5*  --   --  <5*  CREATININE 0.52 0.58 0.47 0.46  --   --  0.35*  CALCIUM 7.9* 7.3* 7.4* 7.6*  --   --  7.0*  MG  --   --   --   --  0.6*  --  1.7    Liver Function Tests:  Recent Labs Lab 02/20/16 2330 02/21/16 0518  AST 31 31  ALT 16 17  ALKPHOS 66 62  BILITOT 0.9 0.9  PROT 6.0* 5.7*  ALBUMIN 2.5* 2.4*   No results for input(s): LIPASE, AMYLASE in the last 168 hours. No results for input(s): AMMONIA in the last 168 hours.  CBC:  Recent Labs Lab 02/20/16 2330 02/21/16 0518 02/23/16 0608 02/24/16 0500   WBC 19.2* 13.2* 15.6* 10.2  NEUTROABS 13.8*  --   --   --   HGB 11.1* 11.1* 9.6* 12.3  HCT 33.4* 33.2* 30.1* 38.3  MCV 84.3 85.6 87.0 87.2  PLT 420* 404* 464* 336    Cardiac Enzymes: No results for input(s): CKTOTAL, CKMB, CKMBINDEX, TROPONINI in the last 168 hours.  Lipid Panel:  Recent Labs Lab 02/23/16 0608  CHOL 138  TRIG 167*  HDL 28*  CHOLHDL 4.9  VLDL 33  LDLCALC 77    CBG:  Recent Labs Lab 02/17/16 1628 02/21/16 0024 02/21/16 0444  GLUCAP 103* 156* 177*    Microbiology: Results for orders placed or performed during the hospital encounter of 02/20/16  Culture, blood (x 2)     Status: None (Preliminary result)   Collection Time: 02/20/16 10:36 PM  Result Value Ref Range Status   Specimen Description BLOOD LEFT ARM  Final   Special Requests BOTTLES DRAWN AEROBIC AND ANAEROBIC 5ML  Final   Culture NO GROWTH 3 DAYS  Final   Report Status PENDING  Incomplete  Culture, blood (x 2)     Status: None (Preliminary result)   Collection Time: 02/20/16 10:53 PM  Result Value Ref Range Status   Specimen Description BLOOD RIGHT ARM  Final   Special Requests AEROBIC BOTTLE ONLY 5ML  Final   Culture NO GROWTH 3 DAYS  Final   Report Status PENDING  Incomplete  MRSA PCR Screening     Status: None   Collection Time: 02/20/16 10:57 PM  Result Value Ref Range Status   MRSA by PCR NEGATIVE NEGATIVE Final    Comment:        The GeneXpert MRSA Assay (FDA approved for NASAL specimens only), is one component of a comprehensive MRSA colonization surveillance program. It is not intended to diagnose MRSA infection nor to guide or monitor treatment for MRSA infections. Performed at [facility]     Coagulation Studies: No results for input(s): LABPROT, INR in the last 72 hours.  Imaging: Mr Brain Wo Contrast  02/22/2016  CLINICAL DATA:  Altered mental status. Combative. Encephalitis versus is encephalopathy. EXAM: MRI HEAD WITHOUT CONTRAST  TECHNIQUE: Multiplanar, multiecho pulse sequences of the brain and surrounding structures were obtained without intravenous contrast. COMPARISON:  CT 02/20/2016 FINDINGS: Motion degraded study. Diffusion imaging is of sufficient quality. Diffusion imaging does not show any acute or subacute infarction. There is a small amount of layering material in the occipital horns of both lateral ventricles which shows restricted diffusion. This could represent hemorrhage or conceivably other abnormal layering material such as protein or pus. No hydrocephalus. No extra-axial collection. Mild chronic small-vessel ischemic changes affect the cerebral hemispheric white matter. Question mild edema in the left occipital lobe without restricted diffusion. This could be seen in the setting of posterior reversible encephalopathy. IMPRESSION: Limited exam. No acute infarction. 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 Small amount of layering material in the occipital horns of both lateral ventricles that could be a small amount of hemorrhage, proteinaceous material or pus. Question mild edema in the left occipital lobe without restricted diffusion. This could be seen in posterior reversible encephalopathy. Electronically Signed   By: Nelson Chimes M.D.   On: 02/22/2016 11:33    Medications:   Current facility-administered medications:  .  0.9 %  sodium chloride infusion, , Intravenous, Continuous, Norval Morton, MD, Last Rate: 100 mL/hr at 02/23/16 1749, 1,000 mL at 02/23/16 1749 .  acetaminophen (TYLENOL) suppository 650 mg, 650 mg, Rectal, Q6H PRN, Ritta Slot, NP, 650 mg at 02/21/16 0026 .  acyclovir (ZOVIRAX) 485 mg in dextrose 5 % 100 mL IVPB, 10 mg/kg, Intravenous, Q8H, Darnell Level Mancheril, RPH, 485 mg at 02/24/16 1101 .  ampicillin (OMNIPEN) 1 g in sodium chloride 0.9 % 50 mL IVPB, 1 g, Intravenous, Q6H, Crystal S Robertson, RPH, 1 g at 02/24/16 1436 .  antiseptic oral rinse (CPC / CETYLPYRIDINIUM CHLORIDE 0.05%)  solution 7 mL, 7 mL, Mouth Rinse, q12n4p, Rondell A Smith, MD, 7 mL at 02/24/16 1200 .  atorvastatin (LIPITOR) tablet 40 mg, 40 mg, Oral, q1800, Hosie Poisson, MD, 40 mg at 02/23/16 1736 .  cefTRIAXone (ROCEPHIN) 2 g in dextrose 5 % 50 mL IVPB, 2 g, Intravenous, BID, Norval Morton, MD, 2 g at 02/24/16 0545 .  chlorhexidine (PERIDEX) 0.12 % solution 15 mL, 15 mL, Mouth Rinse, BID, Rondell A Smith, MD, 15 mL at 02/24/16 1053 .  haloperidol lactate (HALDOL) injection 2 mg, 2 mg, Intravenous, Q6H PRN, Hosie Poisson, MD, 2 mg at 02/24/16 0004 .  LORazepam (ATIVAN) tablet 0.5 mg, 0.5 mg, Oral, Q6H PRN, Hosie Poisson, MD, 0.5 mg at 02/23/16 1736 .  magnesium sulfate IVPB 2 g 50 mL, 2 g, Intravenous, Once, Hosie Poisson, MD, 2 g at 02/24/16 1435 .  metoprolol succinate (TOPROL-XL) 24 hr tablet 100 mg, 100 mg, Oral, BID, Hosie Poisson, MD, 100 mg at 02/24/16 1045 .  mirtazapine (REMERON) tablet 15 mg, 15 mg, Oral, QHS, Hosie Poisson, MD, 15 mg at 02/23/16 2202 .  ondansetron (ZOFRAN) tablet 4 mg, 4 mg, Oral, Q6H PRN, 4 mg at 02/23/16 1736 **OR** ondansetron (ZOFRAN) injection 4 mg, 4 mg, Intravenous, Q6H PRN, Norval Morton, MD, 4 mg at 02/21/16 1248 .  oxyCODONE-acetaminophen (PERCOCET/ROXICET) 5-325 MG per tablet 1-2 tablet, 1-2 tablet, Oral, Q6H PRN, Hosie Poisson, MD, 2 tablet at 02/23/16 1736 .  pantoprazole (PROTONIX) EC tablet 40 mg, 40 mg, Oral, Daily, Hosie Poisson, MD, 40 mg at 02/24/16 1045 .  PARoxetine (PAXIL) tablet 20 mg, 20 mg, Oral, q morning - 10a, Hosie Poisson, MD, 20 mg at 02/24/16 1045 .  potassium chloride 10 mEq in 100 mL IVPB, 10 mEq, Intravenous, Q1 Hr x 3, Hosie Poisson, MD, 10 mEq at 02/24/16 1435 .  potassium chloride SA (K-DUR,KLOR-CON) CR tablet 40 mEq, 40 mEq, Oral, BID, Hosie Poisson, MD, 40 mEq at 02/24/16 1435 .  sodium chloride flush (NS) 0.9 % injection 3 mL, 3 mL, Intravenous, Q12H, Norval Morton, MD, 3 mL at 02/24/16 1436 .  tiotropium (SPIRIVA) inhalation capsule 18 mcg, 18  mcg, Inhalation, Daily PRN, Hosie Poisson, MD .  vancomycin (VANCOCIN) IVPB 750 mg/150 ml premix, 750 mg, Intravenous, Q12H, Kris Mouton, RPH, 750 mg at 02/24/16 1046   Assessment/Plan:  Impression:  38. 75 year old female with what appears to be meningitis. She has been improving with antibiotic therapy, but is still drowsy and disoriented. Her AMS is most likely related to her meningitis; hospital delirium and sedating medications are also on the DDx.  2. Hypomagnesemia and hypokalemia. 3. MRI brain performed on 6/11 revealed a chronic right occipital lobe infarction, a small amount of layered material in the occipital horns most likely representing pus, as well as question of mild edema in the left occipital lobe, the latter finding being compatible with PRES.  4. TTE revealed no mural thrombus or vegetation.   Recomendations: 1. Continue to monitor electrolytes and correct as indicated.  2. MRA head and neck to assess for possible atherosclerotic disease in the context of her prior right occipital lobe stroke.  3. Once able to work with PT and OT, would start therapy.  4. OOB to chair qd. Reorient frequently. Maintain diurnal cycle: lights on and shades open during the day, lights off with no TV at night.  5. EEG completed on 6/12 was mildly abnormal due to mild slowing and suppression of the EEG background, indicative of diffuse cerebral dysfunction. The EEG findings are compatible with the above DDx.  6. Continue treatment with antibiotics.  7. Avoid Haldol and other medications with sedating effect.  8. BP control.  9. Start ASA 81 mg po qd for secondary stroke prevention, if repeat CT in 2 days (6/15) shows no evidence for intraventricular hemorrhage. .    Electronically signed: Dr. Kerney Elbe 02/24/2016, 9:17 AM

## 2016-02-24 NOTE — Progress Notes (Signed)
Family refusing to have Femoral Line removed.  Pt family states they don't think it should be removed with so much IV antibiotics that need to be given.  Will report to Day shift RN to be addressed.

## 2016-02-24 NOTE — Progress Notes (Signed)
PROGRESS NOTE    Melinda Schroeder  [id number] DOB: 20-Oct-1940 DOA: 02/20/2016 PCP: Melinda Graziani, NP    Brief Narrative:  Melinda Schroeder is a 75 y.o. female with medical history significant of COPD, HTN, HLD, GERD, anxiety presents with acute encephalopathy. 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 At baseline patient was able to complete all of her ADLs basically without assistance per family.  Per review of records at [facility] patient was seen to be febrile up to 102.54F, tachycardic, and tachypneic. Sepsis protocol was initiated and she was given vancomycin and Rocephin. WBC count was noted to be 25.7. They obtain a LP which CSF fluid tube# 1 revealed WBCs 97, 92% lymphocytes, RBCs 7, glucose 57, protein 159. Tube 4 showed 105 WBCs, 5 RBCs. Could not find documentation on finding opening pressure. CSF fluid was sent for cultures and further studies. 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 Also, there was edema and fluid posterior to the cervical spine at the level of the posterior C5-6 fusion extending to the skull base.  Assessment & Plan:   Principal Problem:   Acute delirium Active Problems:   Acute encephalopathy   Meningitis   Sepsis ([place])   Hypokalemia   Physical deconditioning   Chronic obstructive pulmonary disease (HCC)   Essential hypertension   Coronary artery disease involving native coronary artery of native heart without angina pectoris   History of fusion of cervical spine   Schwannoma   Tachypnea   Tachycardia   Leukocytosis   Acute blood loss anemia  Suspected sepsis from meningitis with acute encephalopathy :with h/o of recent surgery.  Admitted to stepdown and started on broad spectrum antibiotics with vancomycin,  ampicillin, rocephin and acyclovir, .  Csf cultures, blood cultures are pending and negative so far..  Neurology and neurosurgery consulted and recommendations given.  MRI BRAIN today doesnot show any acute infarction. 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  Lactic acid normal.  MRA head could not be done as she was constantly moving and the it was cancelled.  Called [place] [facility] microbiology department requested the CSF cultures, and added on HSV pcr to be added on to the lab.  They asked me to call back tomorrow for results.    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   Hypokalemia and Hypomagnesemia: replete as needed.  Repeat in am.    Leukocytosis:  Improving.    Hyponatremia;  Resolved.  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  Monitor, if persistently low, will get further work up for SIADH.   Encephalopathy: possibly infectious/ meningitis/ metabolic from electrolyte abnormalities: - with hallucinations and delirium.  - probably hospital acquired delirium  - resolved today.   Hypertension:  Well controlled.  -   DVT prophylaxis: (/SCD's) Code Status: (Full) Family Communication: son at bedside.  Disposition Plan: pending further management, pending PT EVAL. CIR requested.    Consultants:   Neurosurgery   Neurology.   Psychiatry.   Procedures:   MRI brain   MRA brain   Antimicrobials: vancomycin  Rocephin  Ampicillin  Acyclovir.    Subjective: No more hallucinations today.   Objective: Filed Vitals:   02/24/16 0700 02/24/16 0813 02/24/16 1045 02/24/16  1323  BP:  129/53 143/76 101/61  Pulse: 71 70 74 66  Temp: 97 F (36.1 C)   98.1 F (36.7 C)  TempSrc: Axillary   Oral  Resp: 18 16  16   Height:      Weight:      SpO2: 97% 98%  98%    Intake/Output Summary (Last 24 hours) at 02/24/16 1511 Last data filed at 02/24/16 1326  Gross per 24 hour  Intake    2490 ml  Output   2425 ml  Net     65 ml   Filed Weights   02/20/16 2137  Weight: 48.3 kg (106 lb 7.7 oz)    Examination:  General exam: awake and alert and sitting in the chair,  Respiratory system: Clear to auscultation. Respiratory effort normal. Cardiovascular system: S1 & S2 heard, RRR. No JVD, murmurs, rubs, gallops or clicks. No pedal edema. Gastrointestinal system: Abdomen is nondistended, soft and nontender. No organomegaly or masses felt. Normal bowel sounds heard. Central nervous system: drowsy, no new focal deficits.  Extremities: Symmetric 5 x 5 power. Skin: No rashes, lesions or ulcers    Data Reviewed: I have personally reviewed following labs and imaging studies  CBC:  Recent Labs Lab 02/20/16 2330 02/21/16 0518 02/23/16 0608 02/24/16 0500  WBC 19.2* 13.2* 15.6* 10.2  NEUTROABS 13.8*  --   --   --   HGB 11.1* 11.1* 9.6* 12.3  HCT 33.4* 33.2* 30.1* 38.3  MCV 84.3 85.6 87.0 87.2  PLT 420* 404* 464* [id number]   Basic Metabolic Panel:  Recent Labs Lab 02/21/16 0518 02/21/16 2040 02/22/16 0515 02/23/16 0608 02/23/16 1157 02/23/16 2225 02/24/16 0500  NA 129* 130* 138 138  --   --  135  K 3.1* 2.5* 3.6 2.6*  --  3.2* 3.0*  CL 90* 94* 105 101  --   --  99*  CO2 27 25 28 28   --   --  28  GLUCOSE 162* 161* 99 112*  --   --  95  BUN <5* <5* <5* <5*  --   --  <5*  CREATININE 0.52 0.58 0.47 0.46  --   --  0.35*  CALCIUM 7.9* 7.3* 7.4* 7.6*  --   --  7.0*  MG  --   --   --   --  0.6*  --  1.7   GFR: Estimated Creatinine Clearance: 47.8 mL/min (by C-G formula based on Cr of 0.35). Liver Function Tests:  Recent Labs Lab 02/20/16 2330 02/21/16 0518  AST 31 31  ALT 16 17  ALKPHOS 66 62  BILITOT 0.9 0.9  PROT 6.0* 5.7*  ALBUMIN 2.5* 2.4*   No results for input(s): LIPASE, AMYLASE in the last 168 hours. No results for input(s): AMMONIA in the last 168 hours. Coagulation Profile:  Recent Labs Lab 02/20/16 2330  INR 1.21   Cardiac Enzymes: No  results for input(s): CKTOTAL, CKMB, CKMBINDEX, TROPONINI in the last 168 hours. BNP (last 3 results) No results for input(s): PROBNP in the last 8760 hours. HbA1C:  Recent Labs  02/22/16 1145  HGBA1C 5.7*   CBG:  Recent Labs Lab 02/17/16 1628 02/21/16 0024 02/21/16 0444  GLUCAP 103* 156* 177*   Lipid Profile:  Recent Labs  02/22/16 1145 02/23/16 0608  CHOL  --  138  HDL  --  28*  LDLCALC  --  77  TRIG  --  167*  CHOLHDL  --  4.9  LDLDIRECT 88  --  Thyroid Function Tests: No results for input(s): TSH, T4TOTAL, FREET4, T3FREE, THYROIDAB in the last 72 hours. Anemia Panel: No results for input(s): VITAMINB12, FOLATE, FERRITIN, TIBC, IRON, RETICCTPCT in the last 72 hours. Sepsis Labs:  Recent Labs Lab 02/20/16 2330 02/21/16 0519  PROCALCITON 0.72  --   LATICACIDVEN 1.3 0.8    Recent Results (from the past 240 hour(s))  Culture, blood (x 2)     Status: None (Preliminary result)   Collection Time: 02/20/16 10:36 PM  Result Value Ref Range Status   Specimen Description BLOOD LEFT ARM  Final   Special Requests BOTTLES DRAWN AEROBIC AND ANAEROBIC 5ML  Final   Culture NO GROWTH 3 DAYS  Final   Report Status PENDING  Incomplete  Culture, blood (x 2)     Status: None (Preliminary result)   Collection Time: 02/20/16 10:53 PM  Result Value Ref Range Status   Specimen Description BLOOD RIGHT ARM  Final   Special Requests AEROBIC BOTTLE ONLY 5ML  Final   Culture NO GROWTH 3 DAYS  Final   Report Status PENDING  Incomplete  MRSA PCR Screening     Status: None   Collection Time: 02/20/16 10:57 PM  Result Value Ref Range Status   MRSA by PCR NEGATIVE NEGATIVE Final    Comment:        The GeneXpert MRSA Assay (FDA approved for NASAL specimens only), is one component of a comprehensive MRSA colonization surveillance program. It is not intended to diagnose MRSA infection nor to guide or monitor treatment for MRSA infections. Performed at [facility]          Radiology Studies: No results found.      Scheduled Meds: . acyclovir  10 mg/kg Intravenous Q8H  . ampicillin (OMNIPEN) IV  1 g Intravenous Q6H  . antiseptic oral rinse  7 mL Mouth Rinse q12n4p  . atorvastatin  40 mg Oral q1800  . cefTRIAXone (ROCEPHIN)  IV  2 g Intravenous BID  . chlorhexidine  15 mL Mouth Rinse BID  . magnesium sulfate 1 - 4 g bolus IVPB  2 g Intravenous Once  . metoprolol succinate  100 mg Oral BID  . mirtazapine  15 mg Oral QHS  . pantoprazole  40 mg Oral Daily  . PARoxetine  20 mg Oral q morning - 10a  . potassium chloride  10 mEq Intravenous Q1 Hr x 3  . potassium chloride  40 mEq Oral BID  . sodium chloride flush  3 mL Intravenous Q12H  . vancomycin  750 mg Intravenous Q12H   Continuous Infusions: . sodium chloride 1,000 mL (02/23/16 1749)     LOS: 4 days    Time spent: 35 minutes.     Hosie Poisson, MD [facility] Pager [phone]   If 7PM-7AM, please contact night-coverage [email or web address] Password [email or web address] 02/24/2016, 3:11 PM

## 2016-02-24 NOTE — Consult Note (Signed)
[facility] Consult   Reason for Consult:  Confusion, delirium and hallucinations Referring Physician:  Dr. Karleen Hampshire Patient Identification: Melinda Schroeder MRN:  [id number] Principal Diagnosis: Acute delirium Diagnosis:   Patient Active Problem List   Diagnosis Date Noted  . Physical deconditioning [R53.81]   . Acute delirium [R41.0]   . Chronic obstructive pulmonary disease ([place]) [J44.9]   . Essential hypertension [I10]   . Coronary artery disease involving native coronary artery of native heart without angina pectoris [I25.10]   . History of fusion of cervical spine [Z98.1]   . Schwannoma [D36.10]   . Tachypnea [R06.82]   . Tachycardia [R00.0]   . Leukocytosis [D72.829]   . Acute blood loss anemia [D62]   . Meningitis [G03.9] 02/21/2016  . Sepsis ([place]) [A41.9] 02/21/2016  . Hypokalemia [E87.6] 02/21/2016  . Acute encephalopathy [G93.40] 02/20/2016  . 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 Pulmonary nodule [R91.1] 12/21/2011  . COPD (chronic obstructive pulmonary disease) ([place]) [J44.9] 12/21/2011  . Hoarseness [R49.0] 12/21/2011  . Tobacco abuse [Z72.0] 12/21/2011    Total Time spent with patient: 1 hour  Subjective:   Melinda Schroeder is a 75 y.o. female patient admitted with delirium, confusion and hallucinations.  HPI:  Melinda Schroeder is a 75 y.o. Female admitted to [facility] with acute encephalopathy/meningitis from [facility]. Psychiatric consultation requested as patient become acutely delirious with the confusion, hallucinations and have paranoid delusions. Information for this evaluation obtained from available medical records, case discussed with the staff RN and hospitalist Dr. Karleen Hampshire. Case discussed with patient's son and patient's daughter-in-law who were at bedside because they have been frustrated and upset about not getting the information they needed from the staff regarding her condition and status. 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 Patient has been able to participate her ADLs until she becomes unconscious within a short period of time required to go to the [facility] emergency department. Patient woke up of her coming to the [facility] but become more confused, agitated, restless and could not relax and rest in her bed. Feel like she is crawling out of the bed, climbing or jumping between buildings and also started feeling somebody is following her.  Patient also reportedly bruised on her left hand due to being agitated while delirious. Patient was able to respond with the Haldol given last night after being one whole day and could not sleep. Patient reported she has no previous history of acute psychiatric hospitalization, denied suicidal/homicidal ideations mindedness and plans. Patient reported she has low potassium and having hard time to swallow the medication given to her.   Medical history: Patient with medical history significant of COPD, HTN, HLD, GERD, anxiety presents with acute encephalopathy. 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 At baseline patient was able to complete all of her ADLs basically without assistance per family.  Per review of records at [facility] patient was seen to be febrile up to 102.42F, tachycardic, and tachypneic. Sepsis protocol was initiated and she was given vancomycin and Rocephin. WBC count was noted to be 25.7. They obtain a LP which CSF fluid tube# 1 revealed WBCs 97, 92% lymphocytes, RBCs 7, glucose 57, protein 159. Tube 4 showed 105 WBCs, 5 RBCs. Could not find documentation on finding opening  pressure. CSF fluid was sent for cultures and further studies. 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 Also, there was edema and fluid posterior to the cervical spine at the level of the posterior C5-6 fusion extending to the skull base.  Past Psychiatric History: None reported  Risk to Self: Is patient at risk for suicide?: No Risk to Others:   Prior Inpatient Therapy:   Prior Outpatient Therapy:    Past Medical History:  Past Medical History  Diagnosis Date  . Diverticulitis   . Gastritis   . Hypercholesterolemia   . Hypertension   . Anxiety   . COPD (chronic obstructive pulmonary disease) ([place])   . DDD (degenerative disc disease)   . IBS (irritable bowel syndrome)   . Esophageal dysmotilities   . Anginal pain ([place])     years ago  . GERD (gastroesophageal reflux disease)   . Bronchitis, allergic     11-27-15 MD visit -2 injections given , oral Levaquin at present.  [name] Restless legs   . Coronary artery disease     stent placed 15 years ago Dr Lia Foyer. Dr. Steward Ros seen 2 yrs ago- no recent problems or follow up cardiology visits.  [name] PONV (postoperative nausea and vomiting)     nausea  . Shortness of breath dyspnea   . Sepsis ([place]) 02/2016    Past Surgical History  Procedure Laterality Date  . Bladder surgery    . Breast mass right excision    . Hernia repair    . Coronary stent placement    . Appendectomy    . Shoulder sugery Left     rtc repair  . Anterior cervical decomp/discectomy fusion N/A 06/08/2013    Procedure: Cervical Four Five anterior cervical decompression with fusion plating and bonegraft;  Surgeon: Winfield Cunas, MD;  Location: [place] [facility]UR[facility] ORS;  Service: Neurosurgery;  Laterality: N/A;  ANTERIOR CERVICAL DECOMPRESSION/DISCECTOMY FUSION 1 LEVEL  . Abdominal hysterectomy      complete  . Eus N/A 10/17/2014    Procedure: UPPER ENDOSCOPIC ULTRASOUND (EUS) LINEAR;  Surgeon: Milus Banister, MD;  Location: [facility] ENDOSCOPY;  Service: Endoscopy;  Laterality: N/A;  . Eus N/A 12/04/2015    Procedure: UPPER ENDOSCOPIC  ULTRASOUND (EUS) RADIAL;  Surgeon: Milus Banister, MD;  Location: [facility] ENDOSCOPY;  Service: Endoscopy;  Laterality: N/A;  . Laminectomy Left 02/11/2016    Procedure: Left C1-2 Tumor resection/C5-6 Posterior cervical fusion with lateral mass fixation;  Surgeon: Ashok Pall, MD;  Location: [place] [facility]UR[facility] ORS;  Service: Neurosurgery;  Laterality: Left;  Left C1-2 Tumor resection/C5-6 Posterior cervical fusion with lateral mass fixation    Family History:  Family History  Problem Relation Age of Onset  . Emphysema Father   . Stomach cancer Brother   . Stroke Mother    Family Psychiatric  History: Patient daughter-in-law reportedly been diagnosed with bipolar disorder otherwise she has no known family history of mental illness.  Social History:  History  Alcohol Use No     History  Drug Use No    Social History   Social History  . Marital Status: Married    Spouse Name: N/A  . Number of Children: 1  . Years of Education: N/A   Occupational History  . hair stylist    Social History Main Topics  . Smoking status: Current Every Day Smoker -- 1.00  packs/day for 50 years    Types: Cigarettes  . Smokeless tobacco: Never Used  . Alcohol Use: No  . Drug Use: No  . Sexual Activity: Not Asked   Other Topics Concern  . None   Social History Narrative   Additional Social History:    Allergies:   Allergies  Allergen Reactions  . Ace Inhibitors Cough  . Sulfa Antibiotics Nausea Only    Labs:  Results for orders placed or performed during the hospital encounter of 02/20/16 (from the past 48 hour(s))  Hemoglobin A1c     Status: Abnormal   Collection Time: 02/22/16 11:45 AM  Result Value Ref Range   Hgb A1c MFr Bld 5.7 (H) 4.8 - 5.6 %    Comment: (NOTE)         Pre-diabetes: 5.7 - 6.4         Diabetes: >6.4         Glycemic control for adults with diabetes: <7.0    Mean Plasma Glucose 117 mg/dL    Comment: (NOTE) Performed At: [facility] [place],  [place] [id number] Lindon Romp MD WU:[id number]   LDL cholesterol, direct     Status: None   Collection Time: 02/22/16 11:45 AM  Result Value Ref Range   Direct LDL 88 0 - 99 mg/dL    Comment: (NOTE) Performed At: [facility] [place], [place] [id number] Lindon Romp MD VH:[id number]   Basic metabolic panel     Status: Abnormal   Collection Time: 02/23/16  6:08 AM  Result Value Ref Range   Sodium 138 135 - 145 mmol/L   Potassium 2.6 (LL) 3.5 - 5.1 mmol/L    Comment: CRITICAL RESULT CALLED TO, READ BACK BY AND VERIFIED WITH: S.KHOKHOL,RN 02/23/16 0654 BY BSLADE    Chloride 101 101 - 111 mmol/L   CO2 28 22 - 32 mmol/L   Glucose, Bld 112 (H) 65 - 99 mg/dL   BUN <5 (L) 6 - 20 mg/dL   Creatinine, Ser 0.46 0.44 - 1.00 mg/dL   Calcium 7.6 (L) 8.9 - 10.3 mg/dL   GFR calc non Af Amer >60 >60 mL/min   GFR calc Af Amer >60 >60 mL/min    Comment: (NOTE) The eGFR has been calculated using the CKD EPI equation. This calculation has not been validated in all clinical situations. eGFR's persistently <60 mL/min signify possible Chronic Kidney Disease.    Anion gap 9 5 - 15  CBC     Status: Abnormal   Collection Time: 02/23/16  6:08 AM  Result Value Ref Range   WBC 15.6 (H) 4.0 - 10.5 K/uL   RBC 3.46 (L) 3.87 - 5.11 MIL/uL   Hemoglobin 9.6 (L) 12.0 - 15.0 g/dL   HCT 30.1 (L) 36.0 - 46.0 %   MCV 87.0 78.0 - 100.0 fL   MCH 27.7 26.0 - 34.0 pg   MCHC 31.9 30.0 - 36.0 g/dL   RDW 13.8 11.5 - 15.5 %   Platelets 464 (H) 150 - 400 K/uL  Lipid panel     Status: Abnormal   Collection Time: 02/23/16  6:08 AM  Result Value Ref Range   Cholesterol 138 0 - 200 mg/dL   Triglycerides 167 (H) <150 mg/dL   HDL 28 (L) >40 mg/dL   Total CHOL/HDL Ratio 4.9 RATIO   VLDL 33 0 - 40 mg/dL   LDL Cholesterol 77 0 - 99 mg/dL    Comment:  Total Cholesterol/HDL:CHD Risk Coronary Heart Disease Risk Table                     Men   Women  1/2 Average Risk   3.4   3.3   Average Risk       5.0   4.4  2 X Average Risk   9.6   7.1  3 X Average Risk  23.4   11.0        Use the calculated Patient Ratio above and the CHD Risk Table to determine the patient's CHD Risk.        ATP III CLASSIFICATION (LDL):  <100     mg/dL   Optimal  100-129  mg/dL   Near or Above                    Optimal  130-159  mg/dL   Borderline  160-189  mg/dL   High  >190     mg/dL   Very High   Magnesium     Status: Abnormal   Collection Time: 02/23/16 11:57 AM  Result Value Ref Range   Magnesium 0.6 (LL) 1.7 - 2.4 mg/dL    Comment: CRITICAL RESULT CALLED TO, READ BACK BY AND VERIFIED WITH: C THOMPSON,RN [id number] WILDERK   Vancomycin, trough     Status: Abnormal   Collection Time: 02/23/16  5:59 PM  Result Value Ref Range   Vancomycin Tr 7 (L) 10.0 - 20.0 ug/mL  Potassium     Status: Abnormal   Collection Time: 02/23/16 10:25 PM  Result Value Ref Range   Potassium 3.2 (L) 3.5 - 5.1 mmol/L    Comment: DELTA CHECK NOTED  Basic metabolic panel     Status: Abnormal   Collection Time: 02/24/16  5:00 AM  Result Value Ref Range   Sodium 135 135 - 145 mmol/L   Potassium 3.0 (L) 3.5 - 5.1 mmol/L   Chloride 99 (L) 101 - 111 mmol/L   CO2 28 22 - 32 mmol/L   Glucose, Bld 95 65 - 99 mg/dL   BUN <5 (L) 6 - 20 mg/dL   Creatinine, Ser 0.35 (L) 0.44 - 1.00 mg/dL   Calcium 7.0 (L) 8.9 - 10.3 mg/dL   GFR calc non Af Amer >60 >60 mL/min   GFR calc Af Amer >60 >60 mL/min    Comment: (NOTE) The eGFR has been calculated using the CKD EPI equation. This calculation has not been validated in all clinical situations. eGFR's persistently <60 mL/min signify possible Chronic Kidney Disease.    Anion gap 8 5 - 15  CBC     Status: None   Collection Time: 02/24/16  5:00 AM  Result Value Ref Range   WBC 10.2 4.0 - 10.5 K/uL   RBC 4.39 3.87 - 5.11 MIL/uL   Hemoglobin 12.3 12.0 - 15.0 g/dL    Comment: DELTA CHECK NOTED REPEATED TO VERIFY    HCT 38.3 36.0 - 46.0 %   MCV 87.2 78.0 -  100.0 fL   MCH 28.2 26.0 - 34.0 pg   MCHC 32.4 30.0 - 36.0 g/dL   RDW 13.9 11.5 - 15.5 %   Platelets 336 150 - 400 K/uL  Magnesium     Status: None   Collection Time: 02/24/16  5:00 AM  Result Value Ref Range   Magnesium 1.7 1.7 - 2.4 mg/dL    Current Facility-Administered Medications  Medication Dose Route Frequency Provider Last Rate Last Dose  .  0.9 %  sodium chloride infusion   Intravenous Continuous Norval Morton, MD 100 mL/hr at 02/23/16 1749 1,000 mL at 02/23/16 1749  . acetaminophen (TYLENOL) suppository 650 mg  650 mg Rectal Q6H PRN Ritta Slot, NP   650 mg at 02/21/16 0026  . acyclovir (ZOVIRAX) 485 mg in dextrose 5 % 100 mL IVPB  10 mg/kg Intravenous Q8H Darnell Level Mancheril, RPH   485 mg at 02/24/16 0109  . ampicillin (OMNIPEN) 1 g in sodium chloride 0.9 % 50 mL IVPB  1 g Intravenous Q6H Crystal Trellis Moment, RPH   1 g at 02/24/16 0545  . antiseptic oral rinse (CPC / CETYLPYRIDINIUM CHLORIDE 0.05%) solution 7 mL  7 mL Mouth Rinse q12n4p Rondell A Smith, MD   7 mL at 02/23/16 1630  . atorvastatin (LIPITOR) tablet 40 mg  40 mg Oral q1800 Hosie Poisson, MD   40 mg at 02/23/16 1736  . cefTRIAXone (ROCEPHIN) 2 g in dextrose 5 % 50 mL IVPB  2 g Intravenous BID Norval Morton, MD   2 g at 02/24/16 0545  . chlorhexidine (PERIDEX) 0.12 % solution 15 mL  15 mL Mouth Rinse BID Rondell A Tamala Julian, MD   15 mL at 02/23/16 2228  . haloperidol lactate (HALDOL) injection 2 mg  2 mg Intravenous Q6H PRN Hosie Poisson, MD   2 mg at 02/24/16 0004  . LORazepam (ATIVAN) tablet 0.5 mg  0.5 mg Oral Q6H PRN Hosie Poisson, MD   0.5 mg at 02/23/16 1736  . metoprolol succinate (TOPROL-XL) 24 hr tablet 100 mg  100 mg Oral BID Hosie Poisson, MD   100 mg at 02/23/16 2202  . mirtazapine (REMERON) tablet 15 mg  15 mg Oral QHS Hosie Poisson, MD   15 mg at 02/23/16 2202  . ondansetron (ZOFRAN) tablet 4 mg  4 mg Oral Q6H PRN Norval Morton, MD   4 mg at 02/23/16 1736   Or  . ondansetron (ZOFRAN) injection 4 mg  4 mg  Intravenous Q6H PRN Norval Morton, MD   4 mg at 02/21/16 1248  . oxyCODONE-acetaminophen (PERCOCET/ROXICET) 5-325 MG per tablet 1-2 tablet  1-2 tablet Oral Q6H PRN Hosie Poisson, MD   2 tablet at 02/23/16 1736  . pantoprazole (PROTONIX) EC tablet 40 mg  40 mg Oral Daily Hosie Poisson, MD   40 mg at 02/23/16 1037  . PARoxetine (PAXIL) tablet 20 mg  20 mg Oral q morning - 10a Hosie Poisson, MD   20 mg at 02/23/16 1037  . sodium chloride flush (NS) 0.9 % injection 3 mL  3 mL Intravenous Q12H Norval Morton, MD   3 mL at 02/23/16 2219  . tiotropium (SPIRIVA) inhalation capsule 18 mcg  18 mcg Inhalation Daily PRN Hosie Poisson, MD      . vancomycin (VANCOCIN) IVPB 750 mg/150 ml premix  750 mg Intravenous Q12H Kris Mouton, [facility]        Musculoskeletal: Strength & Muscle Tone: decreased Gait & Station: unable to stand Patient leans: N/A  Psychiatric Specialty Exam: Physical Exam as per history and physical   ROS confusion, irritability, agitation, restlessness unable to comfort herself in the bed, paranoid, hallucinations and recent history of cervical spine surgery. Patient has bruising most of her left hand secondary to hitting to the day while as agitated for the last 2-3 days. No Fever-chills, No Headache, No changes with Vision or hearing, reports vertigo No problems swallowing food or Liquids, No Chest pain, Cough  or Shortness of Breath, No Abdominal pain, No Nausea or Vommitting, Bowel movements are regular, No Blood in stool or Urine, No dysuria, No new skin rashes or bruises, No new joints pains-aches,  No new weakness, tingling, numbness in any extremity, No recent weight gain or loss, No polyuria, polydypsia or polyphagia,  A full 10 point Review of Systems was done, except as stated above, all other Review of Systems were negative.  Blood pressure 129/53, pulse 70, temperature 97 F (36.1 C), temperature source Axillary, resp. rate 16, height _0  (1.575 m), weight 48.3 kg (106  lb 7.7 oz), SpO2 98 %.Body mass index is 19.47 kg/(m^2).  General Appearance: Casual  Eye Contact:  Good  Speech:  Slow  Volume:  Decreased  Mood:  Anxious  Affect:  Appropriate and Congruent  Thought Process:  Coherent and Goal Directed  Orientation:  Full (Time, Place, and Person)  Thought Content:  Rumination  Suicidal Thoughts:  No  Homicidal Thoughts:  No  Memory:  Immediate;   Fair Recent;   Fair  Judgement:  Fair  Insight:  Fair  Psychomotor Activity:  Decreased  Concentration:  Concentration: Fair and Attention Span: Fair  Recall:  [employer] of Knowledge:  Good  Language:  Good  Akathisia:  Negative  Handed:  Right  AIMS (if indicated):     Assets:  Communication Skills Desire for Improvement Financial Resources/Insurance Housing Leisure Time Resilience Social Support Transportation  ADL's:  Impaired  Cognition:  Impaired,  Mild  Sleep:        Treatment Plan Summary: Patient presented with acute encephalopathy/meningitis, confusion, agitation, restlessness, paranoid and delusional. Patient post surgery on her cervical spine and also removal of Schwannoma. Patient was found with acute delirium, leukocytosis and hypokalemia. Patient required multiple and dilated therapy to control the infection/inflammation.  Reviewed available medical records and diagnostic workup including labs   Patient acute delirium responded positively to Haldol given last night so we will give Haldol when necessary as needed for near future delirium with psychosis.   Continue Haldol 2 mg intravenous every 6 hours as needed for acute delirium with psychosis We'll continue home medication Paxil / Remeron from primary care physician and will obtain possible indication for this medications from PCP.  Case discussed with Dr. Karleen Hampshire regarding her current mental health status and treatment recommendation who agrees with it.   Appreciate psychiatric consultation and we sign off as of  today Please contact [phone] or [phone] if needs further assistance   Disposition: Patient does not meet criteria for psychiatric inpatient admission. Supportive therapy provided about ongoing stressors.  Ambrose Finland, MD 02/24/2016 10:04 AM

## 2016-02-25 ENCOUNTER — Inpatient Hospital Stay (HOSPITAL_COMMUNITY): Payer: Commercial Managed Care - HMO

## 2016-02-25 LAB — BASIC METABOLIC PANEL
ANION GAP: 5 (ref 5–15)
BUN: <5 mg/dL — ABNORMAL LOW (ref 6–20)
CHLORIDE: 108 mmol/L (ref 101–111)
CO2: 24 mmol/L (ref 22–32)
CREATININE: 0.53 mg/dL (ref 0.44–1.00)
Calcium: 6.9 mg/dL — ABNORMAL LOW (ref 8.9–10.3)
GFR calc non Af Amer: >60 mL/min (ref >60)
GLUCOSE: 116 mg/dL — AB (ref 65–99)
Potassium: 4.4 mmol/L (ref 3.5–5.1)
Sodium: 137 mmol/L (ref 135–145)

## 2016-02-25 LAB — CULTURE, BLOOD (ROUTINE X 2)
CULTURE: NO GROWTH
Culture: NO GROWTH

## 2016-02-25 MED ORDER — MAGNESIUM OXIDE 400 (241.3 MG) MG PO TABS
400.0000 mg | ORAL_TABLET | Freq: Every day | ORAL | Status: AC
  Administered 2016-02-25 – 2016-02-26 (×2): 400 mg via ORAL
  Filled 2016-02-25 (×2): qty 1

## 2016-02-25 MED ORDER — LORAZEPAM 2 MG/ML IJ SOLN
2.0000 mg | Freq: Once | INTRAMUSCULAR | Status: AC
  Administered 2016-02-25: 2 mg via INTRAVENOUS
  Filled 2016-02-25: qty 1

## 2016-02-25 MED ORDER — DEXTROSE 5 % IV SOLN
10.0000 mg/kg | Freq: Two times a day (BID) | INTRAVENOUS | Status: DC
  Administered 2016-02-25 – 2016-02-27 (×4): 485 mg via INTRAVENOUS
  Filled 2016-02-25 (×6): qty 9.7

## 2016-02-25 NOTE — Progress Notes (Signed)
Patient resting quietly at this time. Good appetite, no signs of nausea or vomitting. Vital signs are stable, PRN medication given once this shift for pain. Family at the bedside for the greater portion of the day. Waiting on culture results, have paged the physician. Foley should be removed but patient refuses removal prior to going to MRI. Will also need to clarify if the central line may be removed, physician has been paged. Call bell in reach, will continue to monitor.

## 2016-02-25 NOTE — Progress Notes (Signed)
Foley catheter removed at 1820. Patient eating her dinner per request before transfer to 6N.

## 2016-02-25 NOTE — Progress Notes (Addendum)
PROGRESS NOTE                                                                                                                                                                                                             Patient Demographics:    Melinda Schroeder, is a 75 y.o. female, DOB - June 09, 1941, [id number]  Admit date - 02/20/2016   Admitting Physician Norval Morton, MD  Outpatient Primary MD for the patient is MOON,AMY, NP  LOS - 5  Outpatient Specialists:None  No chief complaint on file.      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 WBC was 25.7K. She was started on empiric vancomycin and Rocephin. She had an LP done with CSF fluid tube# 1 revealed WBCs 97, 92% lymphocytes, RBCs 7, glucose 57, protein 159. Tube 4 showed 105 WBCs, 5 RBCs. Could not find documentation on finding opening pressure. CSF fluid was sent for cultures and further studies. 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 Also, there was edema and fluid posterior to the cervical spine at the level of the posterior C5-6 fusion extending to the skull base. Patient transferred to [facility] for further management.   Subjective:   Patient denies any headaches or dizziness. Her mentation is back to baseline.   Assessment  & Plan :    Principal Problem: Sepsis with acute encephalopathy suspected due to meningitis versus PRES Patient admitted to stepdown and started on empiric vancomycin, ampicillin, Rocephin and  acyclovir. Verified culture results at [facility] today. No growth seen in 72 hours. Labs sent for HSV and cryptococcal antigen. (Was informed that it may take another 2-3 days for results to come back, was sent on 6/12 only) -We will discontinue antibiotics (has already received for 5 days and negative CSF cultures, blood cultures negative.). Discussed with Dr. Graylon Good on the phone who agrees with stopping antibiotics and completing 7 days of empiric acyclovir. -Encephalopathy has now resolved.  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 Small amount of layering material in the occipital horn of both lateral ventricles which could be hemorrhage, proteinaceous material or pus. Also shows mild edema in the left occipital lobe which is suspicious for PR ES. EEG shows nonspecific findings. -Avoiding Haldol and other sedating medications. (Ordered one-time dose of Ativan given severe anxiety prior to MRI) -Ordered MRA head and neck given findings of remote stroke on MRI. Appreciate neurology follow-up. Recommend to start on baby aspirin if no signs of intraventricular hemorrhage on repeat imaging.  Recent C5-C6 surgery with removal of neural Sheath tumor No focal deficit. Follow-up with neurosurgery as outpatient.   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 Start on baby aspirin if repeat imaging negative for intraventricular hemorrhage. Continue statin. Blood pressure stable.  Hypokalemia and hypomagnesemia Replenished.  COPD Stable. Continue home medications.  Essential hypertension Stable.  Hyponatremia ? SIADH. Resolved.   Depression Continue Paxil.       Code Status : Full code  Family Communication  : None at bedside  Disposition Plan  : PT recommends CIR. Consult placed.  Barriers For Discharge : MRA head and neck. Determination a final culture and treatment course. CIR evaluation.  Consults  :   Neurology Psychiatry  Procedures  :  MRI brain EEG  DVT Prophylaxis   :  Lovenox -   Lab Results  Component Value Date   PLT 336 02/24/2016    Antibiotics  :   Anti-infectives    Start     Dose/Rate Route Frequency Ordered Stop   02/25/16 2100  acyclovir (ZOVIRAX) 485 mg in dextrose 5 % 100 mL IVPB     10 mg/kg  48.3 kg 109.7 mL/hr over 60 Minutes Intravenous Every 12 hours 02/25/16 1307     02/24/16 0900  vancomycin (VANCOCIN) IVPB 750 mg/150 ml premix     750 mg 150 mL/hr over 60 Minutes Intravenous Every 12 hours 02/23/16 1952     02/21/16 2200  acyclovir (ZOVIRAX) 250 mg in dextrose 5 % 100 mL IVPB  Status:  Discontinued     250 mg 105 mL/hr over 60 Minutes Intravenous Every 24 hours 02/20/16 2251 02/21/16 0855   02/21/16 1800  vancomycin (VANCOCIN) IVPB 1000 mg/200 mL premix     1,000 mg 200 mL/hr over 60 Minutes Intravenous Every 24 hours 02/20/16 2251 02/23/16 2335   02/21/16 0900  acyclovir (ZOVIRAX) 485 mg in dextrose 5 % 100 mL IVPB  Status:  Discontinued     10 mg/kg  48.3 kg 109.7 mL/hr over 60 Minutes Intravenous Every 8 hours 02/21/16 0855 02/25/16 1307   02/21/16 0600  cefTRIAXone (ROCEPHIN) 2 g in dextrose 5 % 50 mL IVPB     2 g 100 mL/hr over 30 Minutes Intravenous 2 times daily 02/21/16 0025     02/21/16 0400  cefTRIAXone (ROCEPHIN) 2 g in dextrose 5 % 50 mL IVPB  Status:  Discontinued     2 g 100 mL/hr over 30 Minutes Intravenous Every 12 hours 02/20/16 2221 02/21/16 0025   02/21/16 0000  ampicillin (OMNIPEN) 1 g in sodium chloride 0.9 % 50 mL IVPB     1 g 150 mL/hr over 20 Minutes Intravenous Every 6 hours 02/20/16 2251     02/20/16 2300  acyclovir (ZOVIRAX) 500 mg in dextrose 5 % 100 mL IVPB     500 mg 110 mL/hr over 60 Minutes Intravenous NOW 02/20/16 2237 02/21/16 0031   02/20/16 2200  cefTRIAXone (  ROCEPHIN) 2 g in dextrose 5 % 50 mL IVPB  Status:  Discontinued     2 g 100 mL/hr over 30 Minutes Intravenous  Once 02/20/16 2159 02/20/16 2220   02/20/16 2200  vancomycin (VANCOCIN) IVPB 1000 mg/200 mL premix  Status:   Discontinued     1,000 mg 200 mL/hr over 60 Minutes Intravenous  Once 02/20/16 2159 02/20/16 2220   02/20/16 2200  ampicillin (OMNIPEN) 2 g in sodium chloride 0.9 % 50 mL IVPB  Status:  Discontinued     2 g 150 mL/hr over 20 Minutes Intravenous  Once 02/20/16 2159 02/21/16 0025        Objective:   Filed Vitals:   02/25/16 1000 02/25/16 1100 02/25/16 1200 02/25/16 1220  BP: 135/61  84/70 153/71  Pulse: 76 73 73 74  Temp:    98.2 F (36.8 C)  TempSrc:    Oral  Resp: 16 15 17 17   Height:      Weight:      SpO2: 100% 97% 100% 100%    Wt Readings from Last 3 Encounters:  02/20/16 48.3 kg (106 lb 7.7 oz)  02/11/16 50.803 kg (112 lb)  02/02/16 50.973 kg (112 lb 6 oz)     Intake/Output Summary (Last 24 hours) at 02/25/16 1313 Last data filed at 02/25/16 1222  Gross per 24 hour  Intake   2890 ml  Output   4225 ml  Net  -1335 ml     Physical Exam  Gen: not in distress HEENT:  moist mucosa, supple neck Chest: clear b/l, no added sounds CVS: N S1&S2, no murmurs, rubs or gallop GI: soft, NT, ND, BS+ Musculoskeletal: warm, no edema CNS: Alert and oriented, nonfocal    Data Review:    CBC  Recent Labs Lab 02/20/16 2330 02/21/16 0518 02/23/16 0608 02/24/16 0500  WBC 19.2* 13.2* 15.6* 10.2  HGB 11.1* 11.1* 9.6* 12.3  HCT 33.4* 33.2* 30.1* 38.3  PLT 420* 404* 464* 336  MCV 84.3 85.6 87.0 87.2  MCH 28.0 28.6 27.7 28.2  MCHC 33.2 33.4 31.9 32.4  RDW 13.1 13.5 13.8 13.9  LYMPHSABS 3.5  --   --   --   MONOABS 1.9*  --   --   --   EOSABS 0.0  --   --   --   BASOSABS 0.0  --   --   --     Chemistries   Recent Labs Lab 02/20/16 2330 02/21/16 0518 02/21/16 2040 02/22/16 0515 02/23/16 0608 02/23/16 1157 02/23/16 2225 02/24/16 0500 02/25/16 0547  NA 128* 129* 130* 138 138  --   --  135 137  K 2.1* 3.1* 2.5* 3.6 2.6*  --  3.2* 3.0* 4.4  CL 89* 90* 94* 105 101  --   --  99* 108  CO2 26 27 25 28 28   --   --  28 24  GLUCOSE 130* 162* 161* 99 112*  --   --   95 116*  BUN <5* <5* <5* <5* <5*  --   --  <5* <5*  CREATININE 0.55 0.52 0.58 0.47 0.46  --   --  0.35* 0.53  CALCIUM 8.0* 7.9* 7.3* 7.4* 7.6*  --   --  7.0* 6.9*  MG  --   --   --   --   --  0.6*  --  1.7  --   AST 31 31  --   --   --   --   --   --   --  ALT 16 17  --   --   --   --   --   --   --   ALKPHOS 66 62  --   --   --   --   --   --   --   BILITOT 0.9 0.9  --   --   --   --   --   --   --    ------------------------------------------------------------------------------------------------------------------  Recent Labs  02/23/16 0608  CHOL 138  HDL 28*  LDLCALC 77  TRIG 167*  CHOLHDL 4.9    Lab Results  Component Value Date   HGBA1C 5.7* 02/22/2016   ------------------------------------------------------------------------------------------------------------------ No results for input(s): TSH, T4TOTAL, T3FREE, THYROIDAB in the last 72 hours.  Invalid input(s): FREET3 ------------------------------------------------------------------------------------------------------------------ No results for input(s): VITAMINB12, FOLATE, FERRITIN, TIBC, IRON, RETICCTPCT in the last 72 hours.  Coagulation profile  Recent Labs Lab 02/20/16 2330  INR 1.21    No results for input(s): DDIMER in the last 72 hours.  Cardiac Enzymes No results for input(s): CKMB, TROPONINI, MYOGLOBIN in the last 168 hours.  Invalid input(s): CK ------------------------------------------------------------------------------------------------------------------ No results found for: BNP  Inpatient Medications  Scheduled Meds: . acyclovir  10 mg/kg Intravenous Q12H  . ampicillin (OMNIPEN) IV  1 g Intravenous Q6H  . antiseptic oral rinse  7 mL Mouth Rinse q12n4p  . atorvastatin  40 mg Oral q1800  . cefTRIAXone (ROCEPHIN)  IV  2 g Intravenous BID  . chlorhexidine  15 mL Mouth Rinse BID  . LORazepam  2 mg Intravenous Once  . magnesium oxide  400 mg Oral Daily  . metoprolol succinate  100 mg  Oral BID  . mirtazapine  15 mg Oral QHS  . pantoprazole  40 mg Oral Daily  . PARoxetine  20 mg Oral q morning - 10a  . sodium chloride flush  3 mL Intravenous Q12H  . vancomycin  750 mg Intravenous Q12H   Continuous Infusions: . sodium chloride 100 mL/hr at 02/25/16 1200   PRN Meds:.acetaminophen, ondansetron **OR** ondansetron (ZOFRAN) IV, oxyCODONE-acetaminophen, tiotropium  Micro Results Recent Results (from the past 240 hour(s))  Culture, blood (x 2)     Status: None (Preliminary result)   Collection Time: 02/20/16 10:36 PM  Result Value Ref Range Status   Specimen Description BLOOD LEFT ARM  Final   Special Requests BOTTLES DRAWN AEROBIC AND ANAEROBIC 5ML  Final   Culture NO GROWTH 4 DAYS  Final   Report Status PENDING  Incomplete  Culture, blood (x 2)     Status: None (Preliminary result)   Collection Time: 02/20/16 10:53 PM  Result Value Ref Range Status   Specimen Description BLOOD RIGHT ARM  Final   Special Requests AEROBIC BOTTLE ONLY 5ML  Final   Culture NO GROWTH 4 DAYS  Final   Report Status PENDING  Incomplete  MRSA PCR Screening     Status: None   Collection Time: 02/20/16 10:57 PM  Result Value Ref Range Status   MRSA by PCR NEGATIVE NEGATIVE Final    Comment:        The GeneXpert MRSA Assay (FDA approved for NASAL specimens only), is one component of a comprehensive MRSA colonization surveillance program. It is not intended to diagnose MRSA infection nor to guide or monitor treatment for MRSA infections. Performed at [facility]     Radiology Reports Dg Cervical Spine 1 View  02/11/2016  CLINICAL DATA:  C5-6 posterior fusion procedure. Pre-existing C3-4 ACDF. EXAM: DG  C-ARM 61-120 MIN; DG CERVICAL SPINE - 1 VIEW COMPARISON:  Lateral cervical spine image of December 09, 2015 and MRI of the cervical spine of October 20, 2015. FINDINGS: A single lateral fluoro spot image is reviewed. A tissue spreader device is present over the C5-6  spinous processes. Pedicle screws are being placed at both levels. A pre-existing anterior fusion device and intradiscal fusion device at C4-5 is present. IMPRESSION: Single lateral intraoperative fluoro spot image revealing placement of pedicle screws at C5-6 without evidence of immediate complication. Electronically Signed   By: David  Martinique M.D.   On: 02/11/2016 14:46   Mr Brain Wo Contrast  02/22/2016  CLINICAL DATA:  Altered mental status. Combative. Encephalitis versus is encephalopathy. EXAM: MRI HEAD WITHOUT CONTRAST TECHNIQUE: Multiplanar, multiecho pulse sequences of the brain and surrounding structures were obtained without intravenous contrast. COMPARISON:  CT 02/20/2016 FINDINGS: Motion degraded study. Diffusion imaging is of sufficient quality. Diffusion imaging does not show any acute or subacute infarction. There is a small amount of layering material in the occipital horns of both lateral ventricles which shows restricted diffusion. This could represent hemorrhage or conceivably other abnormal layering material such as protein or pus. No hydrocephalus. No extra-axial collection. Mild chronic small-vessel ischemic changes affect the cerebral hemispheric white matter. Question mild edema in the left occipital lobe without restricted diffusion. This could be seen in the setting of posterior reversible encephalopathy. IMPRESSION: Limited exam. No acute infarction. 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 Small amount of layering material in the occipital horns of both lateral ventricles that could be a small amount of hemorrhage, proteinaceous material or pus. Question mild edema in the left occipital lobe without restricted diffusion. This could be seen in posterior reversible encephalopathy. Electronically Signed   By: Nelson Chimes M.D.   On: 02/22/2016 11:33   Dg C-arm 1-60 Min  02/11/2016  CLINICAL DATA:  C5-6 posterior fusion procedure. Pre-existing C3-4 ACDF. EXAM: DG C-ARM 61-120 MIN; DG  CERVICAL SPINE - 1 VIEW COMPARISON:  Lateral cervical spine image of December 09, 2015 and MRI of the cervical spine of October 20, 2015. FINDINGS: A single lateral fluoro spot image is reviewed. A tissue spreader device is present over the C5-6 spinous processes. Pedicle screws are being placed at both levels. A pre-existing anterior fusion device and intradiscal fusion device at C4-5 is present. IMPRESSION: Single lateral intraoperative fluoro spot image revealing placement of pedicle screws at C5-6 without evidence of immediate complication. Electronically Signed   By: David  Martinique M.D.   On: 02/11/2016 14:46    Time Spent in minutes  25   Louellen Molder M.D on 02/25/2016 at 1:13 PM  Between 7am to 7pm - Pager - [phone]  After 7pm go to [email or web address] - password [facility]  [facility] -  Office  [phone]

## 2016-02-25 NOTE — Progress Notes (Signed)
PT Cancellation Note  Patient Details Name: Melinda Schroeder MRN: [id number] DOB: 1941/07/01   Cancelled Treatment:    Reason Eval/Treat Not Completed: Medical issues which prohibited therapy (Pt still w/ femoral a-line).  PT will continue to follow acutely   Collie Siad PT, DPT  Pager: [phone] Phone: [phone] 02/25/2016, 9:17 AM

## 2016-02-25 NOTE — Progress Notes (Signed)
Pharmacy Antibiotic Note  Melinda Schroeder is a 75 y.o. female admitted on 02/20/2016 with meningitis.  Pharmacy has been consulted for vancomycin, ceftriaxone, acyclovir and ampicillin dosing.  Plan: -Vanco 750mg  IV q12h -VT tonight at 2030- before 4th dose on new regimen -Rocephin 2g IV q12h -Ampicillin 1g IV q6hr -Acyclovir 10 mg/kg IV q12h- changed with acute increase in SCr -f/u c/s, clinical progression, renal function  Height: 5\' 2"  (157.5 cm) Weight: 106 lb 7.7 oz (48.3 kg) IBW/kg (Calculated) : 50.1  Temp (24hrs), Avg:97.8 F (36.6 C), Min:97.3 F (36.3 C), Max:98.2 F (36.8 C)   Recent Labs Lab 02/20/16 2330 02/21/16 0518 02/21/16 0519 02/21/16 2040 02/22/16 0515 02/23/16 0608 02/23/16 1759 02/24/16 0500 02/25/16 0547  WBC 19.2* 13.2*  --   --   --  15.6*  --  10.2  --   CREATININE 0.55 0.52  --  0.58 0.47 0.46  --  0.35* 0.53  LATICACIDVEN 1.3  --  0.8  --   --   --   --   --   --   VANCOTROUGH  --   --   --   --   --   --  7*  --   --     Estimated Creatinine Clearance: 47.8 mL/min (by C-G formula based on Cr of 0.53).    Allergies  Allergen Reactions  . Ace Inhibitors Cough  . Sulfa Antibiotics Nausea Only    Antimicrobials this admission: Rocephin 6/9>> Vanco 6/9>> Ampicillin 6/9>> Acyclovir 6/9>>  Dose adjustments this admission: *6/12 VT = 7 on 1g q24> changed to 750 q12  Microbiology results: 6/9 BCx: ngtd  6/9 MRSA PCR: neg  MD has been calling for cx results from [facility]  Thank you for allowing pharmacy to be a part of this patient's care.  Theseus Birnie 02/25/2016 1:01 PM

## 2016-02-25 NOTE — Progress Notes (Signed)
Report called to [facility] RN. No questions at this time. Waiting on patient to return to unit from MRI.

## 2016-02-26 ENCOUNTER — Ambulatory Visit (HOSPITAL_COMMUNITY): Payer: Commercial Managed Care - HMO

## 2016-02-26 DIAGNOSIS — I639 Cerebral infarction, unspecified: Secondary | ICD-10-CM

## 2016-02-26 MED ORDER — ASPIRIN 81 MG PO CHEW
81.0000 mg | CHEWABLE_TABLET | Freq: Every day | ORAL | Status: DC
  Administered 2016-02-26 – 2016-02-27 (×2): 81 mg via ORAL
  Filled 2016-02-26 (×2): qty 1

## 2016-02-26 NOTE — Progress Notes (Signed)
PT Cancellation Note  Patient Details Name: Melinda Schroeder MRN: [id number] DOB: 11-18-1940   Cancelled Treatment:    Reason Eval/Treat Not Completed: Patient not medically ready.  Pt continues to have Femoral line and no clarification that pt is ok for Therapy with line.  Will continue to hold and wither need line removed or clarification from MD that pt is allowed to mobilize with Femoral line.     Catarina Hartshorn, [place] 02/26/2016, 10:49 AM

## 2016-02-26 NOTE — Progress Notes (Signed)
PROGRESS NOTE                                                                                                                                                                                                             Patient Demographics:    Melinda Schroeder, is a 75 y.o. female, DOB - 04-May-1941, [id number]  Admit date - 02/20/2016   Admitting Physician Norval Morton, MD  Outpatient Primary MD for the patient is MOON,AMY, NP  LOS - 6  Outpatient Specialists:None  No chief complaint on file.      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 WBC was 25.7K. She was started on empiric vancomycin and Rocephin. She had an LP done with CSF fluid tube# 1 revealed WBCs 97, 92% lymphocytes, RBCs 7, glucose 57, protein 159. Tube 4 showed 105 WBCs, 5 RBCs. Could not find documentation on finding opening pressure. CSF fluid was sent for cultures and further studies. 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 Also, there was edema and fluid posterior to the cervical spine at the level of the posterior C5-6 fusion extending to the skull base. Patient transferred to [facility] for further management.   Subjective:   Patient denies any headaches or dizziness. Her mentation is back to baseline.   Assessment  & Plan :    Principal Problem: Sepsis with acute encephalopathy suspected due to meningitis versus PRES - admitted to stepdown and started on empiric vancomycin, ampicillin, Rocephin and  acyclovir. -Verified culture results at [place] on 6/14. No growth seen in 72 hours. Labs sent for HSV and cryptococcal antigen. (may take another 2-3 days for results to come back, was sent on 6/12 only) -discontinued antibiotics (has already received for 5 days and negative CSF cultures, blood cultures negative.). Discussed with Dr. Graylon Good on the phone who agrees with stopping antibiotics and completing 7 days of empiric acyclovir. -Encephalopathy  resolved. MRI brain shows old infarction at  the right occipital lobe. Small amount of layering material in the occipital horn of both lateral ventricles which could be hemorrhage, proteinaceous material or pus. Also shows mild edema in the left occipital lobe which is suspicious for PRES. EEG shows nonspecific findings. -Avoiding Haldol and other sedating medications. - MRA head and neck negative for acute findings. comments ? Moderate stenosis of right carotid. Carotid US shows nonsignificant stenosis bilaterally.   Appreciate neurology follow-up. Started baby aspirin per recommendations.  Recent C5-C6 surgery with removal of neural Sheath tumor No focal deficit. Follow-up with neurosurgery as outpatient.   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 Started on baby aspirin.   Hypokalemia and hypomagnesemia Replenished.  COPD Stable. Continue home medications.  Essential hypertension Stable.  Hyponatremia ? SIADH. Resolved.   Depression Continue Paxil.       Code Status : Full code  Family Communication  : called daughter-in-law Hassan Rowan and updated  Disposition Plan  : Initial recommendation per PT was CIR. Since her mentation has improved. She was reevaluated today. May be stable to discharge home tomorrow.  Barriers For Discharge : PT evaluation. Completion of antiviral today.  Consults  :   Neurology Psychiatry  Procedures  :  MRI brain EEG Carotid US  DVT Prophylaxis  :  Lovenox -   Lab Results  Component Value Date   PLT  336 02/24/2016    Antibiotics  :   Anti-infectives    Start     Dose/Rate Route Frequency Ordered Stop   02/25/16 2100  acyclovir (ZOVIRAX) 485 mg in dextrose 5 % 100 mL IVPB     10 mg/kg  48.3 kg 109.7 mL/hr over 60 Minutes Intravenous Every 12 hours 02/25/16 1307     02/24/16 0900  vancomycin (VANCOCIN) IVPB 750 mg/150 ml premix  Status:  Discontinued     750 mg 150 mL/hr over 60 Minutes Intravenous Every 12 hours 02/23/16 1952 02/25/16 1325   02/21/16 2200  acyclovir (ZOVIRAX) 250 mg in dextrose 5 % 100 mL IVPB  Status:  Discontinued     250 mg 105 mL/hr over 60 Minutes Intravenous Every 24 hours 02/20/16 2251 02/21/16 0855   02/21/16 1800  vancomycin (VANCOCIN) IVPB 1000 mg/200 mL premix     1,000 mg 200 mL/hr over 60 Minutes Intravenous Every 24 hours 02/20/16 2251 02/23/16 2335   02/21/16 0900  acyclovir (ZOVIRAX) 485 mg in dextrose 5 % 100 mL IVPB  Status:  Discontinued     10 mg/kg  48.3 kg 109.7 mL/hr over 60 Minutes Intravenous Every 8 hours 02/21/16 0855 02/25/16 1307   02/21/16 0600  cefTRIAXone (ROCEPHIN) 2 g in dextrose 5 % 50 mL IVPB  Status:  Discontinued     2 g 100 mL/hr over 30 Minutes Intravenous 2 times daily 02/21/16 0025 02/25/16 1325   02/21/16 0400  cefTRIAXone (ROCEPHIN) 2 g in dextrose 5 % 50 mL IVPB  Status:  Discontinued     2 g 100 mL/hr over 30 Minutes Intravenous Every 12 hours 02/20/16 2221 02/21/16 0025   02/21/16 0000  ampicillin (OMNIPEN) 1 g in sodium chloride 0.9 % 50 mL IVPB  Status:  Discontinued     1 g 150 mL/hr over 20 Minutes Intravenous Every 6 hours 02/20/16 2251 02/25/16 1326   02/20/16 2300  acyclovir (ZOVIRAX) 500 mg in dextrose 5 % 100 mL IVPB     500 mg 110 mL/hr over 60 Minutes Intravenous NOW 02/20/16 2237 02/21/16 0031   02/20/16 2200  cefTRIAXone (ROCEPHIN)  2 g in dextrose 5 % 50 mL IVPB  Status:  Discontinued     2 g 100 mL/hr over 30 Minutes Intravenous  Once 02/20/16 2159 02/20/16 2220   02/20/16 2200  vancomycin  (VANCOCIN) IVPB 1000 mg/200 mL premix  Status:  Discontinued     1,000 mg 200 mL/hr over 60 Minutes Intravenous  Once 02/20/16 2159 02/20/16 2220   02/20/16 2200  ampicillin (OMNIPEN) 2 g in sodium chloride 0.9 % 50 mL IVPB  Status:  Discontinued     2 g 150 mL/hr over 20 Minutes Intravenous  Once 02/20/16 2159 02/21/16 0025        Objective:   Filed Vitals:   02/25/16 1700 02/25/16 1911 02/25/16 2155 02/26/16 0552  BP:  147/61 137/60 159/72  Pulse: 71 77 68 70  Temp:  98.5 F (36.9 C) 99 F (37.2 C) 99 F (37.2 C)  TempSrc:  Oral Oral Oral  Resp: 16 16 18 18   Height:      Weight:      SpO2: 97% 97% 96% 96%    Wt Readings from Last 3 Encounters:  02/20/16 48.3 kg (106 lb 7.7 oz)  02/11/16 50.803 kg (112 lb)  02/02/16 50.973 kg (112 lb 6 oz)     Intake/Output Summary (Last 24 hours) at 02/26/16 1345 Last data filed at 02/26/16 1140  Gross per 24 hour  Intake   1000 ml  Output   1950 ml  Net   -950 ml     Physical Exam  Gen: not in distress HEENT:  moist mucosa, supple neck Chest: clear b/l, no added sounds CVS: N S1&S2, no murmurs, rubs or gallop GI: soft, NT, ND, BS+ Musculoskeletal: warm, no edema, Femoral catheter+ CNS: Alert and oriented, nonfocal    Data Review:    CBC  Recent Labs Lab 02/20/16 2330 02/21/16 0518 02/23/16 0608 02/24/16 0500  WBC 19.2* 13.2* 15.6* 10.2  HGB 11.1* 11.1* 9.6* 12.3  HCT 33.4* 33.2* 30.1* 38.3  PLT 420* 404* 464* 336  MCV 84.3 85.6 87.0 87.2  MCH 28.0 28.6 27.7 28.2  MCHC 33.2 33.4 31.9 32.4  RDW 13.1 13.5 13.8 13.9  LYMPHSABS 3.5  --   --   --   MONOABS 1.9*  --   --   --   EOSABS 0.0  --   --   --   BASOSABS 0.0  --   --   --     Chemistries   Recent Labs Lab 02/20/16 2330 02/21/16 0518 02/21/16 2040 02/22/16 0515 02/23/16 0608 02/23/16 1157 02/23/16 2225 02/24/16 0500 02/25/16 0547  NA 128* 129* 130* 138 138  --   --  135 137  K 2.1* 3.1* 2.5* 3.6 2.6*  --  3.2* 3.0* 4.4  CL 89* 90* 94*  105 101  --   --  99* 108  CO2 26 27 25 28 28   --   --  28 24  GLUCOSE 130* 162* 161* 99 112*  --   --  95 116*  BUN <5* <5* <5* <5* <5*  --   --  <5* <5*  CREATININE 0.55 0.52 0.58 0.47 0.46  --   --  0.35* 0.53  CALCIUM 8.0* 7.9* 7.3* 7.4* 7.6*  --   --  7.0* 6.9*  MG  --   --   --   --   --  0.6*  --  1.7  --   AST 31 31  --   --   --   --   --   --   --  ALT 16 17  --   --   --   --   --   --   --   ALKPHOS 66 62  --   --   --   --   --   --   --   BILITOT 0.9 0.9  --   --   --   --   --   --   --    ------------------------------------------------------------------------------------------------------------------ No results for input(s): CHOL, HDL, LDLCALC, TRIG, CHOLHDL, LDLDIRECT in the last 72 hours.  Lab Results  Component Value Date   HGBA1C 5.7* 02/22/2016   ------------------------------------------------------------------------------------------------------------------ No results for input(s): TSH, T4TOTAL, T3FREE, THYROIDAB in the last 72 hours.  Invalid input(s): FREET3 ------------------------------------------------------------------------------------------------------------------ No results for input(s): VITAMINB12, FOLATE, FERRITIN, TIBC, IRON, RETICCTPCT in the last 72 hours.  Coagulation profile  Recent Labs Lab 02/20/16 2330  INR 1.21    No results for input(s): DDIMER in the last 72 hours.  Cardiac Enzymes No results for input(s): CKMB, TROPONINI, MYOGLOBIN in the last 168 hours.  Invalid input(s): CK ------------------------------------------------------------------------------------------------------------------ No results found for: BNP  Inpatient Medications  Scheduled Meds: . acyclovir  10 mg/kg Intravenous Q12H  . antiseptic oral rinse  7 mL Mouth Rinse q12n4p  . aspirin  81 mg Oral Daily  . atorvastatin  40 mg Oral q1800  . metoprolol succinate  100 mg Oral BID  . mirtazapine  15 mg Oral QHS  . pantoprazole  40 mg Oral Daily  .  PARoxetine  20 mg Oral q morning - 10a  . sodium chloride flush  3 mL Intravenous Q12H   Continuous Infusions:   PRN Meds:.acetaminophen, ondansetron **OR** ondansetron (ZOFRAN) IV, oxyCODONE-acetaminophen, tiotropium  Micro Results Recent Results (from the past 240 hour(s))  Culture, blood (x 2)     Status: None   Collection Time: 02/20/16 10:36 PM  Result Value Ref Range Status   Specimen Description BLOOD LEFT ARM  Final   Special Requests BOTTLES DRAWN AEROBIC AND ANAEROBIC 5ML  Final   Culture NO GROWTH 5 DAYS  Final   Report Status 02/25/2016 FINAL  Final  Culture, blood (x 2)     Status: None   Collection Time: 02/20/16 10:53 PM  Result Value Ref Range Status   Specimen Description BLOOD RIGHT ARM  Final   Special Requests AEROBIC BOTTLE ONLY 5ML  Final   Culture NO GROWTH 5 DAYS  Final   Report Status 02/25/2016 FINAL  Final  MRSA PCR Screening     Status: None   Collection Time: 02/20/16 10:57 PM  Result Value Ref Range Status   MRSA by PCR NEGATIVE NEGATIVE Final    Comment:        The GeneXpert MRSA Assay (FDA approved for NASAL specimens only), is one component of a comprehensive MRSA colonization surveillance program. It is not intended to diagnose MRSA infection nor to guide or monitor treatment for MRSA infections. Performed at [facility]     Radiology Reports Dg Cervical Spine 1 View  02/11/2016  CLINICAL DATA:  C5-6 posterior fusion procedure. Pre-existing C3-4 ACDF. EXAM: DG C-ARM 61-120 MIN; DG CERVICAL SPINE - 1 VIEW COMPARISON:  Lateral cervical spine image of December 09, 2015 and MRI of the cervical spine of October 20, 2015. FINDINGS: A single lateral fluoro spot image is reviewed. A tissue spreader device is present over the C5-6 spinous processes. Pedicle screws are being placed at both levels. A pre-existing anterior fusion device and intradiscal fusion  device at C4-5 is present. IMPRESSION: Single lateral intraoperative fluoro  spot image revealing placement of pedicle screws at C5-6 without evidence of immediate complication. Electronically Signed   By: David  Martinique M.D.   On: 02/11/2016 14:46   Mr Jodene Nam Head Wo Contrast  02/25/2016  CLINICAL DATA:  Remote infarct of the right occipital lobe. Recent cervical fusion. Sepsis and acute encephalopathy. EXAM: MRA NECK WITHOUT CONTRAST MRA HEAD WITHOUT CONTRAST TECHNIQUE: Multiplanar and multiecho pulse sequences of the neck were obtained without intravenous contrast. Angiographic images of the neck were obtained using MRA technique without intravenous contast.; Angiographic images of the Circle of Willis were obtained using MRA technique without intravenous contrast. COMPARISON:  Diffusion MRI brain sequences 02/21/2006. CT neck with contrast 02/20/2016. FINDINGS: MRA NECK FINDINGS Time-of-flight and enhance images of the neck are distorted by patient motion. The carotid bifurcations are low. Moderate stenosis is suggested on the right. Flow is antegrade in the vertebral arteries bilaterally. MRA HEAD FINDINGS Internal carotid arteries are within normal limits from the high cervical segments through the ICA termini bilaterally. The right A1 segment is dominant. The A1 and M1 segments are otherwise normal. The anterior communicating artery is patent. Small vessel irregularity is present bilaterally without a significant proximal stenosis. The left vertebral artery is the dominant vessel. The right vertebral artery bifurcates at the PICA. There is marked attenuation of the distal right vertebral artery. The basilar artery is within normal limits. The left posterior cerebral artery is of fetal type. There is moderate stenosis in the proximal right posterior cerebral artery. Distal left PCA attenuation is noted. IMPRESSION: 1. Probable moderate narrowing of the right internal carotid artery at the bifurcation. The carotid Doppler study could be used for confirmation. 2. 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 3. High-grade stenosis of the non dominant distal right vertebral artery. 4. Moderate to high-grade stenosis of the proximal right posterior cerebral artery. Electronically Signed   By: San Morelle M.D.   On: 02/25/2016 18:57   Mr Angiogram Neck Wo Contrast  02/25/2016  CLINICAL DATA:  Remote infarct of the right occipital lobe. Recent cervical fusion. Sepsis and acute encephalopathy. EXAM: MRA NECK WITHOUT CONTRAST MRA HEAD WITHOUT CONTRAST TECHNIQUE: Multiplanar and multiecho pulse sequences of the neck were obtained without intravenous contrast. Angiographic images of the neck were obtained using MRA technique without intravenous contast.; Angiographic images of the Circle of Willis were obtained using MRA technique without intravenous contrast. COMPARISON:  Diffusion MRI brain sequences 02/21/2006. CT neck with contrast 02/20/2016. FINDINGS: MRA NECK FINDINGS Time-of-flight and enhance images of the neck are distorted by patient motion. The carotid bifurcations are low. Moderate stenosis is suggested on the right. Flow is antegrade in the vertebral arteries bilaterally. MRA HEAD FINDINGS Internal carotid arteries are within normal limits from the high cervical segments through the ICA termini bilaterally. The right A1 segment is dominant. The A1 and M1 segments are otherwise normal. The anterior communicating artery is patent. Small vessel irregularity is present bilaterally without a significant proximal stenosis. The left vertebral artery is the dominant vessel. The right vertebral artery bifurcates at the PICA. There is marked attenuation of the distal right vertebral artery. The basilar artery is within normal limits. The left posterior cerebral artery is of fetal type. There is moderate stenosis in the proximal right posterior cerebral artery. Distal left PCA attenuation is noted. IMPRESSION: 1. Probable moderate narrowing of the right internal carotid  artery at the bifurcation. The carotid  Doppler study could be used for confirmation. 2. 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 3. High-grade stenosis of the non dominant distal right vertebral artery. 4. Moderate to high-grade stenosis of the proximal right posterior cerebral artery. Electronically Signed   By: San Morelle M.D.   On: 02/25/2016 18:57   Mr Brain Wo Contrast  02/22/2016  CLINICAL DATA:  Altered mental status. Combative. Encephalitis versus is encephalopathy. EXAM: MRI HEAD WITHOUT CONTRAST TECHNIQUE: Multiplanar, multiecho pulse sequences of the brain and surrounding structures were obtained without intravenous contrast. COMPARISON:  CT 02/20/2016 FINDINGS: Motion degraded study. Diffusion imaging is of sufficient quality. Diffusion imaging does not show any acute or subacute infarction. There is a small amount of layering material in the occipital horns of both lateral ventricles which shows restricted diffusion. This could represent hemorrhage or conceivably other abnormal layering material such as protein or pus. No hydrocephalus. No extra-axial collection. Mild chronic small-vessel ischemic changes affect the cerebral hemispheric white matter. Question mild edema in the left occipital lobe without restricted diffusion. This could be seen in the setting of posterior reversible encephalopathy. IMPRESSION: Limited exam. No acute infarction. 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 Small amount of layering material in the occipital horns of both lateral ventricles that could be a small amount of hemorrhage, proteinaceous material or pus. Question mild edema in the left occipital lobe without restricted diffusion. This could be seen in posterior reversible encephalopathy. Electronically Signed   By: Nelson Chimes M.D.   On: 02/22/2016 11:33   Dg C-arm 1-60 Min  02/11/2016  CLINICAL DATA:  C5-6 posterior fusion procedure. Pre-existing C3-4 ACDF. EXAM: DG C-ARM  61-120 MIN; DG CERVICAL SPINE - 1 VIEW COMPARISON:  Lateral cervical spine image of December 09, 2015 and MRI of the cervical spine of October 20, 2015. FINDINGS: A single lateral fluoro spot image is reviewed. A tissue spreader device is present over the C5-6 spinous processes. Pedicle screws are being placed at both levels. A pre-existing anterior fusion device and intradiscal fusion device at C4-5 is present. IMPRESSION: Single lateral intraoperative fluoro spot image revealing placement of pedicle screws at C5-6 without evidence of immediate complication. Electronically Signed   By: David  Martinique M.D.   On: 02/11/2016 14:46    Time Spent in minutes  25   Louellen Molder M.D on 02/26/2016 at 1:45 PM  Between 7am to 7pm - Pager - [phone]  After 7pm go to [email or web address] - password [facility]  [facility] -  Office  [phone]

## 2016-02-26 NOTE — Progress Notes (Signed)
VASCULAR LAB PRELIMINARY  PRELIMINARY  PRELIMINARY  PRELIMINARY  Carotid duplex completed.    Preliminary report:  There is 1-39% right ICA stenosis, highest end of scale.  There is 40-59% left ICA stenosis, lowest end of scale.  Vertebral artery flow is antegrade.   Eliazar Olivar, RVT 02/26/2016, 11:00 AM

## 2016-02-26 NOTE — Progress Notes (Signed)
Speech Language Pathology Treatment: Dysphagia  Patient Details Name: Melinda Schroeder MRN: [id number] DOB: August 27, 1941 Today's Date: 02/26/2016 Time: [id number] SLP Time Calculation (min) (ACUTE ONLY): 19 min  Assessment / Plan / Recommendation Clinical Impression  Pt seen to assess po tolerance and educate her to dysphagia compensation strategies and aspiration precautions.  Pt with hoarse voice which she reports to be normal - pt smokes.  She admits to difficulties with large pills (potassium) and states it is easier dissolved with applesauce.   Small pills pt is taking with liquids.   SLP observed pt with po consumption- timely swallow and clear voice throughout. No indications of airway compromise.  Educated pt to COPD impacting swallow/respirations - and to aspiration precautions.   Pt trying to get OOB after SLP left room despite being informed of need to call for assist due to fall risk. SLP assisted pt to bedside commode and left her with call bell within reach for safety.  Nurse tech arrived to room upon SLP leaving - pt left with nurse tech.   Suspect resolution of delirium - and note pt possibly with PRES and not CVA.  Pt oriented x4 and recalls "seeing spiders", etc.  She reports cognitive linguistic abilities to be at baseline.  Will sign off at this time, please reorder if indicated.     HPI HPI: Pt is a 75 yo female with a hx of recent neck sx and now possible menengitis (being r/o); nursing noted coughing with liquids with morning medication; pt has been lethargic and confused per family since symptoms started on 02/20/16; pt was [facility] on [facility]/20/16      SLP Plan  Continue with current plan of care     Recommendations  Diet recommendations: Regular Liquids provided via: Cup;Straw Medication Administration: Whole meds with liquid (large pills with puree, small pills with liquids) Supervision: Full supervision/cueing for compensatory  strategies Compensations: Minimize environmental distractions;Slow rate;Small sips/bites Postural Changes and/or Swallow Maneuvers: Seated upright 90 degrees             Oral Care Recommendations: Oral care BID Follow up Recommendations: 24 hour supervision/assistance Plan: Continue with current plan of care     [place], [place], [place] [facility] SLP [phone]

## 2016-02-26 NOTE — Progress Notes (Signed)
OT Cancellation Note  Patient Details Name: Melinda Schroeder MRN: [id number] DOB: 05-Dec-1940   Cancelled Treatment:    Reason Eval/Treat Not Completed: Medical issues which prohibited therapy;Patient at procedure or test/ unavailable. Pt continues to have femoral cath as well as pt off unit for [place]. OT will sign off and MD please re order when appropriate  Britt Bottom 02/26/2016, 10:45 AM

## 2016-02-26 NOTE — Progress Notes (Signed)
Noted plan for acyclovir for 7 day empiric course. Acyclovir originally started on 6/9. Stop date entered for tomorrow.  Pharmacy to sign off. Please re-consult if needed.  Lakeva Hollon D. Tianne Plott, PharmD, BCPS Clinical Pharmacist Pager: [phone] 02/26/2016 1:48 PM

## 2016-02-26 NOTE — Progress Notes (Signed)
Rehab admissions - Noted PT eval continues to be on hold due to femoral line.  Will follow along with you for progress and potential rehab needs.  Call me for questions.  [id number]

## 2016-02-27 DIAGNOSIS — Z8673 Personal history of transient ischemic attack (TIA), and cerebral infarction without residual deficits: Secondary | ICD-10-CM

## 2016-02-27 MED ORDER — ASPIRIN 81 MG PO CHEW: 81.0000 mg | CHEWABLE_TABLET | Freq: Every day | ORAL | Status: AC

## 2016-02-27 NOTE — Discharge Summary (Signed)
Physician Discharge Summary  Melinda Schroeder [id number] DOB: 03/23/1941 DOA: 02/20/2016  PCP: Laverna Laflam, NP  Admit date: 02/20/2016 Discharge date: 02/27/2016  Admitted From: Transfer from [facility] Disposition:  Home  Recommendations for Outpatient Follow-up:  Follow-up with PCP in 1 week  Home Health: No Equipment/Devices: None  Discharge Condition: Stable CODE STATUS: Full code Diet recommendation: Heart healthy    Discharge Diagnoses:  Principal Problem:   Sepsis ([place])   Active Problems:   Acute encephalopathy   Meningitis   Hypokalemia   Physical deconditioning   Acute delirium   Chronic obstructive pulmonary disease ([place])   Essential hypertension   Coronary artery disease involving native coronary artery of native heart without angina pectoris   History of fusion of cervical spine   Schwannoma   H/O: CVA (cerebrovascular accident)  Brief Narrative  75 year old female with history of COPD, hypertension, hyperlipidemia, anxiety and GERD with recent posterior cervical fusion at the level of C5-C6 with C2 nerve root tumor resection on 5/31 diagnosed as schwannoma was seen at [facility] with sepsis and acute encephalopathy (fever of 102.6 F, tachycardic and tachypnea). WBC was 25.7K. She was started on empiric vancomycin and Rocephin. She had an LP done with CSF fluid tube# 1 revealed WBCs 97, 92% lymphocytes, RBCs 7, glucose 57, protein 159. Tube 4 showed 105 WBCs, 5 RBCs. Could not find documentation on finding opening pressure. CSF fluid was sent for cultures and further studies. 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 Also, there was edema and fluid posterior to the cervical spine at the level of the posterior C5-6 fusion extending to the skull base. Patient transferred to [facility] for further  management.  Hospital course  Principal Problem: Sepsis with acute encephalopathy suspected due to meningitis versus PRES - admitted to stepdown and started on empiric vancomycin, ampicillin, Rocephin and acyclovir. -Verified culture results at [facility] . Cultures negative including HSV and cryptococcal antigen. -Antibiotics discontinued after she has received for 5 days given negative cultures. Discussed with Dr. Graylon Good on the phone who agrees with stopping antibiotics and completing 7 days of empiric acyclovir. (Discontinued today) -Encephalopathy resolved. 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 Small amount of layering material in the occipital horn of both lateral ventricles which could be hemorrhage, proteinaceous material or pus. Also shows mild edema in the left occipital lobe which is suspicious for PRES. EEG shows nonspecific findings. - MRA head and neck negative for acute findings. comments ? Moderate stenosis of right carotid. Carotid US shows nonsignificant stenosis bilaterally.  Appreciate neurology follow-up. Started baby aspirin per recommendations.  Recent C5-C6 surgery with removal of neural Sheath tumor No focal deficit. Continue Percocet when necessary for pain. Follow-up with neurosurgery as outpatient. Seen by physical therapy and no follow-up needed.   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 Started on baby aspirin. Continue statin.   Hypokalemia and hypomagnesemia Replenished.  COPD Stable. Continue home medications.  Essential hypertension Stable.  Hyponatremia ? SIADH. Resolved.   Depression Continue Paxil.       Code Status : Full code  Family Communication : called daughter-in-law Hassan Rowan and updated on 6/15  Disposition Plan : Home    Consults :  Neurology Psychiatry  Procedures :  MRI brain EEG Carotid US 2-D echo  Discharge Instructions     Medication List    STOP  taking these medications         tiZANidine 4 MG tablet  Commonly known as:  ZANAFLEX      TAKE these medications        aspirin 81 MG chewable tablet  Chew 1 tablet (81 mg total) by mouth daily.     LORazepam 0.5 MG tablet  Commonly known as:  ATIVAN  Take 0.5 mg by mouth every 6 (six) hours as needed for anxiety.     losartan-hydrochlorothiazide 100-12.5 MG tablet  Commonly known as:  HYZAAR  Take 1 tablet by mouth every morning.     metoprolol succinate 100 MG 24 hr tablet  Commonly known as:  TOPROL-XL  Take 100 mg by mouth 2 (two) times daily. Take with or immediately following a meal.     mirtazapine 15 MG tablet  Commonly known as:  REMERON  Take 15 mg by mouth at bedtime.     nitroGLYCERIN 0.4 MG SL tablet  Commonly known as:  NITROSTAT  Place 0.4 mg under the tongue every 5 (five) minutes as needed for chest pain.     omeprazole 20 MG capsule  Commonly known as:  PRILOSEC  Take 20 mg by mouth 2 (two) times daily before a meal.     oxyCODONE-acetaminophen 5-325 MG tablet  Commonly known as:  PERCOCET/ROXICET  Take 1-2 tablets by mouth every 6 (six) hours as needed for moderate pain.     PARoxetine 20 MG tablet  Commonly known as:  PAXIL  Take 20 mg by mouth every morning.     potassium chloride 10 MEQ tablet  Commonly known as:  K-DUR,KLOR-CON  Take 10 mEq by mouth 2 (two) times daily.     simvastatin 80 MG tablet  Commonly known as:  ZOCOR  Take 80 mg by mouth at bedtime.     tiotropium 18 MCG inhalation capsule  Commonly known as:  SPIRIVA  Place 18 mcg into inhaler and inhale daily as needed (Shortness of breath).           Follow-up Information    Follow up with [facility], NP. Schedule an appointment as soon as possible for a visit in 1 week.   Specialty:  Internal Medicine   Contact information:   [place] [address] [phone]      Allergies  Allergen Reactions  . Ace Inhibitors Cough  . Sulfa Antibiotics Nausea Only       Procedures/Studies: Dg Cervical Spine 1 View  02/11/2016  CLINICAL DATA:  C5-6 posterior fusion procedure. Pre-existing C3-4 ACDF. EXAM: DG C-ARM 61-120 MIN; DG CERVICAL SPINE - 1 VIEW COMPARISON:  Lateral cervical spine image of December 09, 2015 and MRI of the cervical spine of October 20, 2015. FINDINGS: A single lateral fluoro spot image is reviewed. A tissue spreader device is present over the C5-6 spinous processes. Pedicle screws are being placed at both levels. A pre-existing anterior fusion device and intradiscal fusion device at C4-5 is present. IMPRESSION: Single lateral intraoperative fluoro spot image revealing placement of pedicle screws at C5-6 without evidence of immediate complication. Electronically Signed   By: David  Martinique M.D.   On: 02/11/2016 14:46   Mr Jodene Nam Head Wo Contrast  02/25/2016  CLINICAL DATA:  Remote infarct of the right occipital lobe. Recent cervical fusion. Sepsis and acute encephalopathy. EXAM: MRA NECK WITHOUT CONTRAST MRA HEAD WITHOUT CONTRAST TECHNIQUE: Multiplanar and multiecho pulse sequences of the neck were obtained without intravenous contrast. Angiographic images of  the neck were obtained using MRA technique without intravenous contast.; Angiographic images of the Circle of Willis were obtained using MRA technique without intravenous contrast. COMPARISON:  Diffusion MRI brain sequences 02/21/2006. CT neck with contrast 02/20/2016. FINDINGS: MRA NECK FINDINGS Time-of-flight and enhance images of the neck are distorted by patient motion. The carotid bifurcations are low. Moderate stenosis is suggested on the right. Flow is antegrade in the vertebral arteries bilaterally. MRA HEAD FINDINGS Internal carotid arteries are within normal limits from the high cervical segments through the ICA termini bilaterally. The right A1 segment is dominant. The A1 and M1 segments are otherwise normal. The anterior communicating artery is patent. Small vessel irregularity is  present bilaterally without a significant proximal stenosis. The left vertebral artery is the dominant vessel. The right vertebral artery bifurcates at the PICA. There is marked attenuation of the distal right vertebral artery. The basilar artery is within normal limits. The left posterior cerebral artery is of fetal type. There is moderate stenosis in the proximal right posterior cerebral artery. Distal left PCA attenuation is noted. IMPRESSION: 1. Probable moderate narrowing of the right internal carotid artery at the bifurcation. The carotid Doppler study could be used for confirmation. 2. 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 3. High-grade stenosis of the non dominant distal right vertebral artery. 4. Moderate to high-grade stenosis of the proximal right posterior cerebral artery. Electronically Signed   By: San Morelle M.D.   On: 02/25/2016 18:57   Mr Angiogram Neck Wo Contrast  02/25/2016  CLINICAL DATA:  Remote infarct of the right occipital lobe. Recent cervical fusion. Sepsis and acute encephalopathy. EXAM: MRA NECK WITHOUT CONTRAST MRA HEAD WITHOUT CONTRAST TECHNIQUE: Multiplanar and multiecho pulse sequences of the neck were obtained without intravenous contrast. Angiographic images of the neck were obtained using MRA technique without intravenous contast.; Angiographic images of the Circle of Willis were obtained using MRA technique without intravenous contrast. COMPARISON:  Diffusion MRI brain sequences 02/21/2006. CT neck with contrast 02/20/2016. FINDINGS: MRA NECK FINDINGS Time-of-flight and enhance images of the neck are distorted by patient motion. The carotid bifurcations are low. Moderate stenosis is suggested on the right. Flow is antegrade in the vertebral arteries bilaterally. MRA HEAD FINDINGS Internal carotid arteries are within normal limits from the high cervical segments through the ICA termini bilaterally. The right A1 segment is dominant. The A1  and M1 segments are otherwise normal. The anterior communicating artery is patent. Small vessel irregularity is present bilaterally without a significant proximal stenosis. The left vertebral artery is the dominant vessel. The right vertebral artery bifurcates at the PICA. There is marked attenuation of the distal right vertebral artery. The basilar artery is within normal limits. The left posterior cerebral artery is of fetal type. There is moderate stenosis in the proximal right posterior cerebral artery. Distal left PCA attenuation is noted. IMPRESSION: 1. Probable moderate narrowing of the right internal carotid artery at the bifurcation. The carotid Doppler study could be used for confirmation. 2. 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 3. High-grade stenosis of the non dominant distal right vertebral artery. 4. Moderate to high-grade stenosis of the proximal right posterior cerebral artery. Electronically Signed   By: San Morelle M.D.   On: 02/25/2016 18:57   Mr Brain Wo Contrast  02/22/2016  CLINICAL DATA:  Altered mental status. Combative. Encephalitis versus is encephalopathy. EXAM: MRI HEAD WITHOUT CONTRAST TECHNIQUE: Multiplanar, multiecho pulse sequences of the brain and surrounding structures were  obtained without intravenous contrast. COMPARISON:  CT 02/20/2016 FINDINGS: Motion degraded study. Diffusion imaging is of sufficient quality. Diffusion imaging does not show any acute or subacute infarction. There is a small amount of layering material in the occipital horns of both lateral ventricles which shows restricted diffusion. This could represent hemorrhage or conceivably other abnormal layering material such as protein or pus. No hydrocephalus. No extra-axial collection. Mild chronic small-vessel ischemic changes affect the cerebral hemispheric white matter. Question mild edema in the left occipital lobe without restricted diffusion. This could be seen in the  setting of posterior reversible encephalopathy. IMPRESSION: Limited exam. No acute infarction. 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 Small amount of layering material in the occipital horns of both lateral ventricles that could be a small amount of hemorrhage, proteinaceous material or pus. Question mild edema in the left occipital lobe without restricted diffusion. This could be seen in posterior reversible encephalopathy. Electronically Signed   By: Nelson Chimes M.D.   On: 02/22/2016 11:33   Dg C-arm 1-60 Min  02/11/2016  CLINICAL DATA:  C5-6 posterior fusion procedure. Pre-existing C3-4 ACDF. EXAM: DG C-ARM 61-120 MIN; DG CERVICAL SPINE - 1 VIEW COMPARISON:  Lateral cervical spine image of December 09, 2015 and MRI of the cervical spine of October 20, 2015. FINDINGS: A single lateral fluoro spot image is reviewed. A tissue spreader device is present over the C5-6 spinous processes. Pedicle screws are being placed at both levels. A pre-existing anterior fusion device and intradiscal fusion device at C4-5 is present. IMPRESSION: Single lateral intraoperative fluoro spot image revealing placement of pedicle screws at C5-6 without evidence of immediate complication. Electronically Signed   By: David  Martinique M.D.   On: 02/11/2016 14:46     2-D echo (02/22/2016) Study Conclusions  - Left ventricle: The cavity size was normal. Systolic function was  normal. The estimated ejection fraction was in the range of 55%  to 60%. Wall motion was normal; there were no regional wall  motion abnormalities. There was an increased relative  contribution of atrial contraction to ventricular filling.  Doppler parameters are consistent with abnormal left ventricular  relaxation (grade 1 diastolic dysfunction). Doppler parameters  are consistent with high ventricular filling pressure. - Mitral valve: Calcified annulus. There was mild regurgitation. - Left atrium: The atrium was mildly  dilated.   Subjective:   Discharge Exam: Filed Vitals:   02/26/16 2109 02/27/16 0628  BP: 152/74 138/66  Pulse: 76 74  Temp: 98.2 F (36.8 C) 98 F (36.7 C)  Resp: 17 17   Filed Vitals:   02/26/16 0552 02/26/16 1441 02/26/16 2109 02/27/16 0628  BP: 159/72 155/72 152/74 138/66  Pulse: 70 72 76 74  Temp: 99 F (37.2 C) 98.1 F (36.7 C) 98.2 F (36.8 C) 98 F (36.7 C)  TempSrc: Oral Oral Oral Oral  Resp: 18 18 17 17   Height:      Weight:      SpO2: 96% 97% 96% 96%    Gen: not in distress HEENT: moist mucosa, supple neck Chest: clear b/l, no added sounds CVS: N S1&S2, no murmurs, rubs or gallop GI: soft, NT, ND, BS+ Musculoskeletal: warm, no edema,  CNS: Alert and oriented, nonfocal   The results of significant diagnostics from this hospitalization (including imaging, microbiology, ancillary and laboratory) are listed below for reference.     Microbiology: Recent Results (from the past 240 hour(s))  Culture, blood (x 2)     Status: None   Collection Time: 02/20/16  10:36 PM  Result Value Ref Range Status   Specimen Description BLOOD LEFT ARM  Final   Special Requests BOTTLES DRAWN AEROBIC AND ANAEROBIC 5ML  Final   Culture NO GROWTH 5 DAYS  Final   Report Status 02/25/2016 FINAL  Final  Culture, blood (x 2)     Status: None   Collection Time: 02/20/16 10:53 PM  Result Value Ref Range Status   Specimen Description BLOOD RIGHT ARM  Final   Special Requests AEROBIC BOTTLE ONLY 5ML  Final   Culture NO GROWTH 5 DAYS  Final   Report Status 02/25/2016 FINAL  Final  MRSA PCR Screening     Status: None   Collection Time: 02/20/16 10:57 PM  Result Value Ref Range Status   MRSA by PCR NEGATIVE NEGATIVE Final    Comment:        The GeneXpert MRSA Assay (FDA approved for NASAL specimens only), is one component of a comprehensive MRSA colonization surveillance program. It is not intended to diagnose MRSA infection nor to guide or monitor treatment for MRSA  infections. Performed at [facility]: BNP (last 3 results) No results for input(s): BNP in the last 8760 hours. Basic Metabolic Panel:  Recent Labs Lab 02/21/16 2040 02/22/16 0515 02/23/16 0608 02/23/16 1157 02/23/16 2225 02/24/16 0500 02/25/16 0547  NA 130* 138 138  --   --  135 137  K 2.5* 3.6 2.6*  --  3.2* 3.0* 4.4  CL 94* 105 101  --   --  99* 108  CO2 25 28 28   --   --  28 24  GLUCOSE 161* 99 112*  --   --  95 116*  BUN <5* <5* <5*  --   --  <5* <5*  CREATININE 0.58 0.47 0.46  --   --  0.35* 0.53  CALCIUM 7.3* 7.4* 7.6*  --   --  7.0* 6.9*  MG  --   --   --  0.6*  --  1.7  --    Liver Function Tests:  Recent Labs Lab 02/20/16 2330 02/21/16 0518  AST 31 31  ALT 16 17  ALKPHOS 66 62  BILITOT 0.9 0.9  PROT 6.0* 5.7*  ALBUMIN 2.5* 2.4*   No results for input(s): LIPASE, AMYLASE in the last 168 hours. No results for input(s): AMMONIA in the last 168 hours. CBC:  Recent Labs Lab 02/20/16 2330 02/21/16 0518 02/23/16 0608 02/24/16 0500  WBC 19.2* 13.2* 15.6* 10.2  NEUTROABS 13.8*  --   --   --   HGB 11.1* 11.1* 9.6* 12.3  HCT 33.4* 33.2* 30.1* 38.3  MCV 84.3 85.6 87.0 87.2  PLT 420* 404* 464* 336   Cardiac Enzymes: No results for input(s): CKTOTAL, CKMB, CKMBINDEX, TROPONINI in the last 168 hours. BNP: Invalid input(s): POCBNP CBG:  Recent Labs Lab 02/21/16 0024 02/21/16 0444  GLUCAP 156* 177*   D-Dimer No results for input(s): DDIMER in the last 72 hours. Hgb A1c No results for input(s): HGBA1C in the last 72 hours. Lipid Profile No results for input(s): CHOL, HDL, LDLCALC, TRIG, CHOLHDL, LDLDIRECT in the last 72 hours. Thyroid function studies No results for input(s): TSH, T4TOTAL, T3FREE, THYROIDAB in the last 72 hours.  Invalid input(s): FREET3 Anemia work up No results for input(s): VITAMINB12, FOLATE, FERRITIN, TIBC, IRON, RETICCTPCT in the last 72 hours. Urinalysis No results found for: COLORURINE,  APPEARANCEUR, LABSPEC, [place], GLUCOSEU, HGBUR, BILIRUBINUR, KETONESUR, PROTEINUR, UROBILINOGEN, NITRITE,  LEUKOCYTESUR Sepsis Labs Invalid input(s): PROCALCITONIN,  WBC,  LACTICIDVEN Microbiology Recent Results (from the past 240 hour(s))  Culture, blood (x 2)     Status: None   Collection Time: 02/20/16 10:36 PM  Result Value Ref Range Status   Specimen Description BLOOD LEFT ARM  Final   Special Requests BOTTLES DRAWN AEROBIC AND ANAEROBIC 5ML  Final   Culture NO GROWTH 5 DAYS  Final   Report Status 02/25/2016 FINAL  Final  Culture, blood (x 2)     Status: None   Collection Time: 02/20/16 10:53 PM  Result Value Ref Range Status   Specimen Description BLOOD RIGHT ARM  Final   Special Requests AEROBIC BOTTLE ONLY 5ML  Final   Culture NO GROWTH 5 DAYS  Final   Report Status 02/25/2016 FINAL  Final  MRSA PCR Screening     Status: None   Collection Time: 02/20/16 10:57 PM  Result Value Ref Range Status   MRSA by PCR NEGATIVE NEGATIVE Final    Comment:        The GeneXpert MRSA Assay (FDA approved for NASAL specimens only), is one component of a comprehensive MRSA colonization surveillance program. It is not intended to diagnose MRSA infection nor to guide or monitor treatment for MRSA infections. Performed at [facility]      Time coordinating discharge: Over 30 minutes  SIGNED:   Louellen Molder, MD  [facility] 02/27/2016, 1:25 PM Pager   If 7PM-7AM, please contact night-coverage [email or web address] Password [email or web address]

## 2016-02-27 NOTE — Evaluation (Signed)
Physical Therapy Evaluation and Discharge Patient Details Name: Melinda Schroeder MRN: [id number] DOB: 1941-07-30 Today's Date: 02/27/2016   History of Present Illness  pt presents with Sepsis and Encephalopathy and r/o meningitis vs PRES.  pt with hx of recent C5-6 ACDF and C2 Nerve root tumor resection.  PMH of COPD, HTN, and Anxiety.    Clinical Impression  Pt up ambulating around room independently and demonstrates good safety awareness.  Pt indicates feeling near baseline with only a little overall fatigue.  Feel pt is safe for return to home and does not have any PT needs at this time.  Will sign off.      Follow Up Recommendations No PT follow up;Supervision - Intermittent    Equipment Recommendations  None recommended by PT    Recommendations for Other Services       Precautions / Restrictions Precautions Precautions: Fall;Cervical Restrictions Weight Bearing Restrictions: No      Mobility  Bed Mobility Overal bed mobility: Modified Independent                Transfers Overall transfer level: Modified independent Equipment used: None                Ambulation/Gait Ambulation/Gait assistance: Modified independent (Device/Increase time) Ambulation Distance (Feet): 100 Feet Assistive device: None Gait Pattern/deviations: Step-through pattern     General Gait Details: pt up ambulating around room and performing oral hygiene in the bathroom on PT arrival.  pt able to move around the room negotiating around furniture without deficit.    Stairs            Wheelchair Mobility    Modified Rankin (Stroke Patients Only)       Balance Overall balance assessment: Modified Independent                                           Pertinent Vitals/Pain Pain Assessment: No/denies pain    Home Living Family/patient expects to be discharged to:: Private residence Living Arrangements: Spouse/significant other Available Help at  Discharge: Family;Friend(s);Available 24 hours/day Type of Home: House Home Access: Stairs to enter   [employer] of Steps: "Just a couple." Home Layout: One level Home Equipment: None      Prior Function Level of Independence: Independent               Hand Dominance   Dominant Hand: Right    Extremity/Trunk Assessment   Upper Extremity Assessment: Defer to OT evaluation           Lower Extremity Assessment: Overall WFL for tasks assessed         Communication   Communication: No difficulties  Cognition Arousal/Alertness: Awake/alert Behavior During Therapy: WFL for tasks assessed/performed Overall Cognitive Status: Within Functional Limits for tasks assessed                      General Comments      Exercises        Assessment/Plan    PT Assessment Patent does not need any further PT services  PT Diagnosis Difficulty walking   PT Problem List    PT Treatment Interventions     PT Goals (Current goals can be found in the Care Plan section) Acute Rehab PT Goals Patient Stated Goal: Home PT Goal Formulation: All assessment and education complete, DC therapy  Frequency     Barriers to discharge        Co-evaluation               End of Session   Activity Tolerance: Patient tolerated treatment well Patient left: in chair;with call bell/phone within reach Nurse Communication: Mobility status         Time: [id number] PT Time Calculation (min) (ACUTE ONLY): 17 min   Charges:   PT Evaluation $PT Eval Low Complexity: 1 Procedure     PT G Codes:        Melinda Schroeder 02/27/2016, 11:28 AM

## 2016-02-27 NOTE — Progress Notes (Signed)
Offer pt. A bath but Pt. Stated she is hoping to go home today and just wash up at home but if she stays she will be washing up later on during the day

## 2016-02-27 NOTE — Progress Notes (Signed)
Discharge home. Home discharge instruction given, no question verbalized. 

## 2016-03-02 DIAGNOSIS — I10 Essential (primary) hypertension: Secondary | ICD-10-CM | POA: Diagnosis not present

## 2016-03-02 DIAGNOSIS — E876 Hypokalemia: Secondary | ICD-10-CM | POA: Diagnosis not present

## 2016-03-02 DIAGNOSIS — Z6821 Body mass index (BMI) 21.0-21.9, adult: Secondary | ICD-10-CM | POA: Diagnosis not present

## 2016-03-05 DIAGNOSIS — Z48811 Encounter for surgical aftercare following surgery on the nervous system: Secondary | ICD-10-CM | POA: Diagnosis not present

## 2016-03-09 DIAGNOSIS — M4802 Spinal stenosis, cervical region: Secondary | ICD-10-CM | POA: Diagnosis not present

## 2016-03-10 DIAGNOSIS — Z48811 Encounter for surgical aftercare following surgery on the nervous system: Secondary | ICD-10-CM | POA: Diagnosis not present

## 2016-03-12 DIAGNOSIS — Z48811 Encounter for surgical aftercare following surgery on the nervous system: Secondary | ICD-10-CM | POA: Diagnosis not present

## 2016-03-15 DIAGNOSIS — Z48811 Encounter for surgical aftercare following surgery on the nervous system: Secondary | ICD-10-CM | POA: Diagnosis not present

## 2016-03-16 DIAGNOSIS — Z48811 Encounter for surgical aftercare following surgery on the nervous system: Secondary | ICD-10-CM | POA: Diagnosis not present

## 2016-03-17 DIAGNOSIS — Z48811 Encounter for surgical aftercare following surgery on the nervous system: Secondary | ICD-10-CM | POA: Diagnosis not present

## 2016-03-31 DIAGNOSIS — Z48811 Encounter for surgical aftercare following surgery on the nervous system: Secondary | ICD-10-CM | POA: Diagnosis not present

## 2016-04-06 DIAGNOSIS — R4182 Altered mental status, unspecified: Secondary | ICD-10-CM | POA: Diagnosis not present

## 2016-04-06 DIAGNOSIS — M8589 Other specified disorders of bone density and structure, multiple sites: Secondary | ICD-10-CM | POA: Diagnosis not present

## 2016-04-06 DIAGNOSIS — M1990 Unspecified osteoarthritis, unspecified site: Secondary | ICD-10-CM | POA: Diagnosis not present

## 2016-04-06 DIAGNOSIS — E871 Hypo-osmolality and hyponatremia: Secondary | ICD-10-CM | POA: Diagnosis not present

## 2016-04-06 DIAGNOSIS — E876 Hypokalemia: Secondary | ICD-10-CM | POA: Diagnosis not present

## 2016-04-06 DIAGNOSIS — F419 Anxiety disorder, unspecified: Secondary | ICD-10-CM | POA: Diagnosis not present

## 2016-04-06 DIAGNOSIS — D539 Nutritional anemia, unspecified: Secondary | ICD-10-CM | POA: Diagnosis not present

## 2016-04-07 DIAGNOSIS — G039 Meningitis, unspecified: Secondary | ICD-10-CM | POA: Diagnosis not present

## 2016-04-07 DIAGNOSIS — Z48811 Encounter for surgical aftercare following surgery on the nervous system: Secondary | ICD-10-CM | POA: Diagnosis not present

## 2016-04-13 DIAGNOSIS — Z48811 Encounter for surgical aftercare following surgery on the nervous system: Secondary | ICD-10-CM | POA: Diagnosis not present

## 2016-06-08 DIAGNOSIS — Z1389 Encounter for screening for other disorder: Secondary | ICD-10-CM | POA: Diagnosis not present

## 2016-06-08 DIAGNOSIS — D539 Nutritional anemia, unspecified: Secondary | ICD-10-CM | POA: Diagnosis not present

## 2016-06-08 DIAGNOSIS — M1990 Unspecified osteoarthritis, unspecified site: Secondary | ICD-10-CM | POA: Diagnosis not present

## 2016-06-08 DIAGNOSIS — J449 Chronic obstructive pulmonary disease, unspecified: Secondary | ICD-10-CM | POA: Diagnosis not present

## 2016-06-08 DIAGNOSIS — E785 Hyperlipidemia, unspecified: Secondary | ICD-10-CM | POA: Diagnosis not present

## 2016-06-08 DIAGNOSIS — E871 Hypo-osmolality and hyponatremia: Secondary | ICD-10-CM | POA: Diagnosis not present

## 2016-06-08 DIAGNOSIS — M8589 Other specified disorders of bone density and structure, multiple sites: Secondary | ICD-10-CM | POA: Diagnosis not present

## 2016-06-15 DIAGNOSIS — I10 Essential (primary) hypertension: Secondary | ICD-10-CM | POA: Diagnosis not present

## 2016-06-28 DIAGNOSIS — M8589 Other specified disorders of bone density and structure, multiple sites: Secondary | ICD-10-CM | POA: Diagnosis not present

## 2016-06-28 DIAGNOSIS — Z13820 Encounter for screening for osteoporosis: Secondary | ICD-10-CM | POA: Diagnosis not present

## 2016-06-30 ENCOUNTER — Other Ambulatory Visit: Payer: Self-pay | Admitting: Pharmacist

## 2016-06-30 NOTE — Patient Outreach (Signed)
Outreach call to [employer] regarding her request for follow up from the [facility] Medication Adherence Campaign. HIPAA identifiers verified and verbal consent received.   Patient reports that she has been taking her simvastatin nightly as directed. Denies any missed doses or any barriers to taking her medications such as cost or side effects.   Patient reports that she has no medication questions or concerns at this time.  Harlow Asa, PharmD Clinical Pharmacist [facility] [phone]

## 2016-08-30 DIAGNOSIS — M47892 Other spondylosis, cervical region: Secondary | ICD-10-CM | POA: Diagnosis not present

## 2016-08-30 DIAGNOSIS — D492 Neoplasm of unspecified behavior of bone, soft tissue, and skin: Secondary | ICD-10-CM | POA: Diagnosis not present

## 2016-09-08 DIAGNOSIS — J449 Chronic obstructive pulmonary disease, unspecified: Secondary | ICD-10-CM | POA: Diagnosis not present

## 2016-09-08 DIAGNOSIS — I10 Essential (primary) hypertension: Secondary | ICD-10-CM | POA: Diagnosis not present

## 2016-09-08 DIAGNOSIS — D539 Nutritional anemia, unspecified: Secondary | ICD-10-CM | POA: Diagnosis not present

## 2016-09-21 DIAGNOSIS — L03012 Cellulitis of left finger: Secondary | ICD-10-CM | POA: Diagnosis not present

## 2016-10-05 DIAGNOSIS — D539 Nutritional anemia, unspecified: Secondary | ICD-10-CM | POA: Diagnosis not present

## 2016-10-26 DIAGNOSIS — Z0100 Encounter for examination of eyes and vision without abnormal findings: Secondary | ICD-10-CM | POA: Diagnosis not present

## 2016-10-26 DIAGNOSIS — H52223 Regular astigmatism, bilateral: Secondary | ICD-10-CM | POA: Diagnosis not present

## 2016-11-02 DIAGNOSIS — D492 Neoplasm of unspecified behavior of bone, soft tissue, and skin: Secondary | ICD-10-CM | POA: Diagnosis not present

## 2016-11-02 DIAGNOSIS — M4322 Fusion of spine, cervical region: Secondary | ICD-10-CM | POA: Diagnosis not present

## 2016-11-08 DIAGNOSIS — I959 Hypotension, unspecified: Secondary | ICD-10-CM | POA: Diagnosis not present

## 2016-11-08 DIAGNOSIS — M1990 Unspecified osteoarthritis, unspecified site: Secondary | ICD-10-CM | POA: Diagnosis not present

## 2016-11-08 DIAGNOSIS — Z6821 Body mass index (BMI) 21.0-21.9, adult: Secondary | ICD-10-CM | POA: Diagnosis not present

## 2016-11-15 DIAGNOSIS — M47812 Spondylosis without myelopathy or radiculopathy, cervical region: Secondary | ICD-10-CM | POA: Diagnosis not present

## 2016-11-15 DIAGNOSIS — D492 Neoplasm of unspecified behavior of bone, soft tissue, and skin: Secondary | ICD-10-CM | POA: Diagnosis not present

## 2017-01-14 DIAGNOSIS — D539 Nutritional anemia, unspecified: Secondary | ICD-10-CM | POA: Diagnosis not present

## 2017-01-14 DIAGNOSIS — E785 Hyperlipidemia, unspecified: Secondary | ICD-10-CM | POA: Diagnosis not present

## 2017-01-14 DIAGNOSIS — Z6823 Body mass index (BMI) 23.0-23.9, adult: Secondary | ICD-10-CM | POA: Diagnosis not present

## 2017-01-14 DIAGNOSIS — I10 Essential (primary) hypertension: Secondary | ICD-10-CM | POA: Diagnosis not present

## 2017-01-14 DIAGNOSIS — J449 Chronic obstructive pulmonary disease, unspecified: Secondary | ICD-10-CM | POA: Diagnosis not present

## 2017-01-14 DIAGNOSIS — J440 Chronic obstructive pulmonary disease with acute lower respiratory infection: Secondary | ICD-10-CM | POA: Diagnosis not present

## 2017-01-14 DIAGNOSIS — Z9181 History of falling: Secondary | ICD-10-CM | POA: Diagnosis not present

## 2017-01-14 DIAGNOSIS — M1990 Unspecified osteoarthritis, unspecified site: Secondary | ICD-10-CM | POA: Diagnosis not present

## 2017-01-25 DIAGNOSIS — Z1231 Encounter for screening mammogram for malignant neoplasm of breast: Secondary | ICD-10-CM | POA: Diagnosis not present

## 2017-02-21 DIAGNOSIS — R112 Nausea with vomiting, unspecified: Secondary | ICD-10-CM | POA: Diagnosis not present

## 2017-02-21 DIAGNOSIS — Z6821 Body mass index (BMI) 21.0-21.9, adult: Secondary | ICD-10-CM | POA: Diagnosis not present

## 2017-02-21 DIAGNOSIS — J440 Chronic obstructive pulmonary disease with acute lower respiratory infection: Secondary | ICD-10-CM | POA: Diagnosis not present

## 2017-02-23 DIAGNOSIS — F17210 Nicotine dependence, cigarettes, uncomplicated: Secondary | ICD-10-CM | POA: Diagnosis not present

## 2017-02-23 DIAGNOSIS — B370 Candidal stomatitis: Secondary | ICD-10-CM | POA: Diagnosis not present

## 2017-02-23 DIAGNOSIS — K7689 Other specified diseases of liver: Secondary | ICD-10-CM | POA: Diagnosis not present

## 2017-02-23 DIAGNOSIS — R740 Nonspecific elevation of levels of transaminase and lactic acid dehydrogenase [LDH]: Secondary | ICD-10-CM | POA: Diagnosis not present

## 2017-02-23 DIAGNOSIS — N179 Acute kidney failure, unspecified: Secondary | ICD-10-CM | POA: Diagnosis not present

## 2017-02-23 DIAGNOSIS — J189 Pneumonia, unspecified organism: Secondary | ICD-10-CM | POA: Diagnosis not present

## 2017-02-23 DIAGNOSIS — E860 Dehydration: Secondary | ICD-10-CM | POA: Diagnosis not present

## 2017-02-23 DIAGNOSIS — R05 Cough: Secondary | ICD-10-CM | POA: Diagnosis not present

## 2017-02-23 DIAGNOSIS — I2510 Atherosclerotic heart disease of native coronary artery without angina pectoris: Secondary | ICD-10-CM | POA: Diagnosis not present

## 2017-02-23 DIAGNOSIS — J441 Chronic obstructive pulmonary disease with (acute) exacerbation: Secondary | ICD-10-CM | POA: Diagnosis not present

## 2017-02-23 DIAGNOSIS — Z955 Presence of coronary angioplasty implant and graft: Secondary | ICD-10-CM | POA: Diagnosis not present

## 2017-02-23 DIAGNOSIS — J440 Chronic obstructive pulmonary disease with acute lower respiratory infection: Secondary | ICD-10-CM | POA: Diagnosis not present

## 2017-02-28 DIAGNOSIS — J441 Chronic obstructive pulmonary disease with (acute) exacerbation: Secondary | ICD-10-CM | POA: Diagnosis not present

## 2017-03-04 DIAGNOSIS — Z139 Encounter for screening, unspecified: Secondary | ICD-10-CM | POA: Diagnosis not present

## 2017-03-04 DIAGNOSIS — I959 Hypotension, unspecified: Secondary | ICD-10-CM | POA: Diagnosis not present

## 2017-03-04 DIAGNOSIS — E876 Hypokalemia: Secondary | ICD-10-CM | POA: Diagnosis not present

## 2017-03-04 DIAGNOSIS — R748 Abnormal levels of other serum enzymes: Secondary | ICD-10-CM | POA: Diagnosis not present

## 2017-03-04 DIAGNOSIS — J189 Pneumonia, unspecified organism: Secondary | ICD-10-CM | POA: Diagnosis not present

## 2017-03-04 DIAGNOSIS — I10 Essential (primary) hypertension: Secondary | ICD-10-CM | POA: Diagnosis not present

## 2017-03-04 DIAGNOSIS — R5381 Other malaise: Secondary | ICD-10-CM | POA: Diagnosis not present

## 2017-03-04 DIAGNOSIS — R112 Nausea with vomiting, unspecified: Secondary | ICD-10-CM | POA: Diagnosis not present

## 2017-03-04 DIAGNOSIS — J441 Chronic obstructive pulmonary disease with (acute) exacerbation: Secondary | ICD-10-CM | POA: Diagnosis not present

## 2017-03-11 DIAGNOSIS — I10 Essential (primary) hypertension: Secondary | ICD-10-CM | POA: Diagnosis not present

## 2017-03-11 DIAGNOSIS — E538 Deficiency of other specified B group vitamins: Secondary | ICD-10-CM | POA: Diagnosis not present

## 2017-03-11 DIAGNOSIS — I959 Hypotension, unspecified: Secondary | ICD-10-CM | POA: Diagnosis not present

## 2017-03-11 DIAGNOSIS — R5381 Other malaise: Secondary | ICD-10-CM | POA: Diagnosis not present

## 2017-03-18 DIAGNOSIS — D510 Vitamin B12 deficiency anemia due to intrinsic factor deficiency: Secondary | ICD-10-CM | POA: Diagnosis not present

## 2017-03-25 DIAGNOSIS — D510 Vitamin B12 deficiency anemia due to intrinsic factor deficiency: Secondary | ICD-10-CM | POA: Diagnosis not present

## 2017-04-08 DIAGNOSIS — E538 Deficiency of other specified B group vitamins: Secondary | ICD-10-CM | POA: Diagnosis not present

## 2017-04-08 DIAGNOSIS — M1990 Unspecified osteoarthritis, unspecified site: Secondary | ICD-10-CM | POA: Diagnosis not present

## 2017-04-08 DIAGNOSIS — I10 Essential (primary) hypertension: Secondary | ICD-10-CM | POA: Diagnosis not present

## 2017-04-08 DIAGNOSIS — F419 Anxiety disorder, unspecified: Secondary | ICD-10-CM | POA: Diagnosis not present

## 2017-04-08 DIAGNOSIS — E871 Hypo-osmolality and hyponatremia: Secondary | ICD-10-CM | POA: Diagnosis not present

## 2017-05-11 DIAGNOSIS — D492 Neoplasm of unspecified behavior of bone, soft tissue, and skin: Secondary | ICD-10-CM | POA: Diagnosis not present

## 2017-05-11 DIAGNOSIS — C479 Malignant neoplasm of peripheral nerves and autonomic nervous system, unspecified: Secondary | ICD-10-CM | POA: Diagnosis not present

## 2017-05-17 DIAGNOSIS — M47892 Other spondylosis, cervical region: Secondary | ICD-10-CM | POA: Diagnosis not present

## 2017-05-17 DIAGNOSIS — D492 Neoplasm of unspecified behavior of bone, soft tissue, and skin: Secondary | ICD-10-CM | POA: Diagnosis not present

## 2017-05-18 DIAGNOSIS — M1990 Unspecified osteoarthritis, unspecified site: Secondary | ICD-10-CM | POA: Diagnosis not present

## 2017-05-18 DIAGNOSIS — D539 Nutritional anemia, unspecified: Secondary | ICD-10-CM | POA: Diagnosis not present

## 2017-05-18 DIAGNOSIS — J449 Chronic obstructive pulmonary disease, unspecified: Secondary | ICD-10-CM | POA: Diagnosis not present

## 2017-05-18 DIAGNOSIS — I10 Essential (primary) hypertension: Secondary | ICD-10-CM | POA: Diagnosis not present

## 2017-05-18 DIAGNOSIS — E785 Hyperlipidemia, unspecified: Secondary | ICD-10-CM | POA: Diagnosis not present

## 2017-06-20 DIAGNOSIS — E785 Hyperlipidemia, unspecified: Secondary | ICD-10-CM | POA: Diagnosis not present

## 2017-06-20 DIAGNOSIS — Z0000 Encounter for general adult medical examination without abnormal findings: Secondary | ICD-10-CM | POA: Diagnosis not present

## 2017-06-20 DIAGNOSIS — Z1389 Encounter for screening for other disorder: Secondary | ICD-10-CM | POA: Diagnosis not present

## 2017-06-20 DIAGNOSIS — Z9181 History of falling: Secondary | ICD-10-CM | POA: Diagnosis not present

## 2017-06-20 DIAGNOSIS — D539 Nutritional anemia, unspecified: Secondary | ICD-10-CM | POA: Diagnosis not present

## 2017-07-11 DIAGNOSIS — Z6821 Body mass index (BMI) 21.0-21.9, adult: Secondary | ICD-10-CM | POA: Diagnosis not present

## 2017-07-11 DIAGNOSIS — J440 Chronic obstructive pulmonary disease with acute lower respiratory infection: Secondary | ICD-10-CM | POA: Diagnosis not present

## 2017-07-11 DIAGNOSIS — F17200 Nicotine dependence, unspecified, uncomplicated: Secondary | ICD-10-CM | POA: Diagnosis not present

## 2017-07-11 DIAGNOSIS — M1990 Unspecified osteoarthritis, unspecified site: Secondary | ICD-10-CM | POA: Diagnosis not present

## 2017-07-11 DIAGNOSIS — I10 Essential (primary) hypertension: Secondary | ICD-10-CM | POA: Diagnosis not present

## 2017-07-22 DIAGNOSIS — D539 Nutritional anemia, unspecified: Secondary | ICD-10-CM | POA: Diagnosis not present

## 2017-08-10 DIAGNOSIS — K219 Gastro-esophageal reflux disease without esophagitis: Secondary | ICD-10-CM | POA: Diagnosis not present

## 2017-08-10 DIAGNOSIS — R1310 Dysphagia, unspecified: Secondary | ICD-10-CM | POA: Diagnosis not present

## 2017-08-24 DIAGNOSIS — D510 Vitamin B12 deficiency anemia due to intrinsic factor deficiency: Secondary | ICD-10-CM | POA: Diagnosis not present

## 2017-09-26 DIAGNOSIS — E785 Hyperlipidemia, unspecified: Secondary | ICD-10-CM | POA: Diagnosis not present

## 2017-09-26 DIAGNOSIS — M1990 Unspecified osteoarthritis, unspecified site: Secondary | ICD-10-CM | POA: Diagnosis not present

## 2017-09-26 DIAGNOSIS — J440 Chronic obstructive pulmonary disease with acute lower respiratory infection: Secondary | ICD-10-CM | POA: Diagnosis not present

## 2017-09-26 DIAGNOSIS — I10 Essential (primary) hypertension: Secondary | ICD-10-CM | POA: Diagnosis not present

## 2017-09-26 DIAGNOSIS — D539 Nutritional anemia, unspecified: Secondary | ICD-10-CM | POA: Diagnosis not present

## 2017-10-27 DIAGNOSIS — D539 Nutritional anemia, unspecified: Secondary | ICD-10-CM | POA: Diagnosis not present

## 2017-11-23 ENCOUNTER — Telehealth: Payer: Self-pay | Admitting: Gastroenterology

## 2017-11-23 NOTE — Telephone Encounter (Signed)
Dr Ardis Hughs I do not see where this pt was advised to have repeat EUS in 2 years, please advise

## 2017-11-24 ENCOUNTER — Other Ambulatory Visit: Payer: Self-pay

## 2017-11-24 DIAGNOSIS — K3189 Other diseases of stomach and duodenum: Secondary | ICD-10-CM

## 2017-11-24 NOTE — Telephone Encounter (Signed)
EUS scheduled, pt instructed and medications reviewed.  Patient instructions mailed to home.  Patient to call with any questions or concerns.  

## 2017-11-24 NOTE — Telephone Encounter (Signed)
I don't see that either but usually that is my recommendation in her situation.  Go ahead and schedule upper EUS for gastric mass surveillance, MAC, next available eus Thursday.  thanks

## 2017-11-28 DIAGNOSIS — D539 Nutritional anemia, unspecified: Secondary | ICD-10-CM | POA: Diagnosis not present

## 2017-12-01 ENCOUNTER — Encounter (HOSPITAL_COMMUNITY): Payer: Self-pay | Admitting: Emergency Medicine

## 2017-12-01 ENCOUNTER — Other Ambulatory Visit: Payer: Self-pay

## 2017-12-14 NOTE — Anesthesia Preprocedure Evaluation (Addendum)
Anesthesia Evaluation  Patient identified by MRN, date of birth, ID band Patient awake    Reviewed: Allergy & Precautions, NPO status , Patient's Chart, lab work & pertinent test results  Airway Mallampati: III  TM Distance: >3 FB Neck ROM: Full    Dental no notable dental hx. (+) Upper Dentures, Lower Dentures   Pulmonary shortness of breath and with exertion, COPD,  COPD inhaler, Current Smoker,    Pulmonary exam normal breath sounds clear to auscultation       Cardiovascular hypertension, Pt. on medications and Pt. on home beta blockers + angina with exertion + CAD  Normal cardiovascular exam Rhythm:Regular Rate:Normal  Echo 6-17: ejection fraction was in the range of 55%   to 60%. Wall motion was normal;   Neuro/Psych PSYCHIATRIC DISORDERS Anxiety CVA, Residual Symptoms    GI/Hepatic Neg liver ROS, GERD  Medicated and Controlled,Gastric mass   Endo/Other  negative endocrine ROSHyperlipidemia  Renal/GU negative Renal ROS  negative genitourinary   Musculoskeletal  (+) Arthritis , Osteoarthritis,    Abdominal   Peds negative pediatric ROS (+)  Hematology  (+) anemia ,   Anesthesia Other Findings   Reproductive/Obstetrics                           Anesthesia Physical Anesthesia Plan  ASA: III  Anesthesia Plan: MAC   Post-op Pain Management:    Induction: Intravenous  PONV Risk Score and Plan:   Airway Management Planned: Mask, Natural Airway and Nasal Cannula  Additional Equipment:   Intra-op Plan:   Post-operative Plan:   Informed Consent: I have reviewed the patients History and Physical, chart, labs and discussed the procedure including the risks, benefits and alternatives for the proposed anesthesia with the patient or authorized representative who has indicated his/her understanding and acceptance.   Dental advisory given  Plan Discussed with: CRNA, Anesthesiologist  and Surgeon  Anesthesia Plan Comments:        Anesthesia Quick Evaluation

## 2017-12-15 ENCOUNTER — Encounter (HOSPITAL_COMMUNITY): Payer: Self-pay | Admitting: Anesthesiology

## 2017-12-15 ENCOUNTER — Ambulatory Visit (HOSPITAL_COMMUNITY)
Admission: RE | Admit: 2017-12-15 | Discharge: 2017-12-15 | Disposition: A | Discharge status: Home / Self Care | Payer: Medicare HMO | Source: Ambulatory Visit | Attending: Gastroenterology | Admitting: Gastroenterology

## 2017-12-15 ENCOUNTER — Other Ambulatory Visit: Payer: Self-pay

## 2017-12-15 ENCOUNTER — Telehealth: Payer: Self-pay

## 2017-12-15 ENCOUNTER — Ambulatory Visit (HOSPITAL_COMMUNITY): Payer: Medicare HMO | Admitting: Anesthesiology

## 2017-12-15 ENCOUNTER — Encounter (HOSPITAL_COMMUNITY)
Admission: RE | Disposition: A | Discharge status: Home / Self Care | Payer: Self-pay | Source: Ambulatory Visit | Attending: Gastroenterology

## 2017-12-15 DIAGNOSIS — Z8673 Personal history of transient ischemic attack (TIA), and cerebral infarction without residual deficits: Secondary | ICD-10-CM | POA: Diagnosis not present

## 2017-12-15 DIAGNOSIS — F419 Anxiety disorder, unspecified: Secondary | ICD-10-CM | POA: Diagnosis not present

## 2017-12-15 DIAGNOSIS — I2510 Atherosclerotic heart disease of native coronary artery without angina pectoris: Secondary | ICD-10-CM | POA: Diagnosis not present

## 2017-12-15 DIAGNOSIS — K219 Gastro-esophageal reflux disease without esophagitis: Secondary | ICD-10-CM | POA: Diagnosis not present

## 2017-12-15 DIAGNOSIS — Z79899 Other long term (current) drug therapy: Secondary | ICD-10-CM | POA: Insufficient documentation

## 2017-12-15 DIAGNOSIS — K3189 Other diseases of stomach and duodenum: Secondary | ICD-10-CM

## 2017-12-15 DIAGNOSIS — K2970 Gastritis, unspecified, without bleeding: Secondary | ICD-10-CM | POA: Diagnosis not present

## 2017-12-15 DIAGNOSIS — F17210 Nicotine dependence, cigarettes, uncomplicated: Secondary | ICD-10-CM | POA: Diagnosis not present

## 2017-12-15 DIAGNOSIS — I10 Essential (primary) hypertension: Secondary | ICD-10-CM | POA: Diagnosis not present

## 2017-12-15 DIAGNOSIS — B3781 Candidal esophagitis: Secondary | ICD-10-CM | POA: Insufficient documentation

## 2017-12-15 DIAGNOSIS — J449 Chronic obstructive pulmonary disease, unspecified: Secondary | ICD-10-CM | POA: Diagnosis not present

## 2017-12-15 HISTORY — PX: EUS: SHX5427

## 2017-12-15 SURGERY — UPPER ENDOSCOPIC ULTRASOUND (EUS) RADIAL
Anesthesia: Monitor Anesthesia Care

## 2017-12-15 MED ORDER — FLUCONAZOLE 100 MG PO TABS: 100.0000 mg | ORAL_TABLET | Freq: Every day | ORAL | 0 refills | Status: AC

## 2017-12-15 MED ORDER — PROPOFOL 10 MG/ML IV BOLUS
INTRAVENOUS | Status: AC
  Filled 2017-12-15: qty 40

## 2017-12-15 MED ORDER — PROPOFOL 10 MG/ML IV BOLUS
INTRAVENOUS | Status: DC | PRN
  Administered 2017-12-15: 20 mg via INTRAVENOUS

## 2017-12-15 MED ORDER — SODIUM CHLORIDE 0.9 % IV SOLN: INTRAVENOUS | Status: DC

## 2017-12-15 MED ORDER — LACTATED RINGERS IV SOLN
INTRAVENOUS | Status: DC
  Administered 2017-12-15: 1000 mL via INTRAVENOUS

## 2017-12-15 MED ORDER — PROPOFOL 500 MG/50ML IV EMUL
INTRAVENOUS | Status: DC | PRN
  Administered 2017-12-15: 60 ug/kg/min via INTRAVENOUS

## 2017-12-15 NOTE — Telephone Encounter (Signed)
-----   Message from Milus Banister, MD sent at 12/15/2017 12:33 PM EDT ----- Chong Sicilian, Can you please send a copy of today's EUS to Dr. Lyda Jester at [place].  She needs referral to a general surgeon which I will leave to their office. Tell them that she prefers to be referred to Dr. Rogelia Boga in [place]. Thanks

## 2017-12-15 NOTE — Telephone Encounter (Signed)
Report faxed to Dr Lyda Jester.

## 2017-12-15 NOTE — Transfer of Care (Signed)
Immediate Anesthesia Transfer of Care Note  Patient: Melinda Schroeder  Procedure(s) Performed: Procedure(s): UPPER ENDOSCOPIC ULTRASOUND (EUS) RADIAL (N/A)  Patient Location: PACU  Anesthesia Type:MAC  Level of Consciousness:  sedated, patient cooperative and responds to stimulation  Airway & Oxygen Therapy:Patient Spontanous Breathing and Patient connected to face mask oxgen  Post-op Assessment:  Report given to PACU RN and Post -op Vital signs reviewed and stable  Post vital signs:  Reviewed and stable  Last Vitals:  Vitals:   12/15/17 1049 12/15/17 1216  BP: (!) 158/65 (!) 111/38  Pulse: 66 72  Resp: 15 18  Temp: 36.7 C 37.2 C  SpO2: 81% 19%    Complications: No apparent anesthesia complications

## 2017-12-15 NOTE — Discharge Instructions (Signed)
YOU HAD AN ENDOSCOPIC PROCEDURE TODAY: Refer to the procedure report and other information in the discharge instructions given to you for any specific questions about what was found during the examination. If this information does not answer your questions, please call [place] office at [phone] to clarify.  ° °YOU SHOULD EXPECT: Some feelings of bloating in the abdomen. Passage of more gas than usual. Walking can help get rid of the air that was put into your GI tract during the procedure and reduce the bloating. If you had a lower endoscopy (such as a colonoscopy or flexible sigmoidoscopy) you may notice spotting of blood in your stool or on the toilet paper. Some abdominal soreness may be present for a day or two, also. ° °DIET: Your first meal following the procedure should be a light meal and then it is ok to progress to your normal diet. A half-sandwich or bowl of soup is an example of a good first meal. Heavy or fried foods are harder to digest and may make you feel nauseous or bloated. Drink plenty of fluids but you should avoid alcoholic beverages for 24 hours. If you had a esophageal dilation, please see attached instructions for diet.   ° °ACTIVITY: Your care partner should take you home directly after the procedure. You should plan to take it easy, moving slowly for the rest of the day. You can resume normal activity the day after the procedure however YOU SHOULD NOT DRIVE, use power tools, machinery or perform tasks that involve climbing or major physical exertion for 24 hours (because of the sedation medicines used during the test).  ° °SYMPTOMS TO REPORT IMMEDIATELY: °A gastroenterologist can be reached at any hour. Please call [phone]  for any of the following symptoms:  °Following lower endoscopy (colonoscopy, flexible sigmoidoscopy) °Excessive amounts of blood in the stool  °Significant tenderness, worsening of abdominal pains  °Swelling of the abdomen that is new, acute  °Fever of 100° or  higher  °Following upper endoscopy (EGD, EUS, ERCP, esophageal dilation) °Vomiting of blood or coffee ground material  °New, significant abdominal pain  °New, significant chest pain or pain under the shoulder blades  °Painful or persistently difficult swallowing  °New shortness of breath  °Black, tarry-looking or red, bloody stools ° °FOLLOW UP:  °If any biopsies were taken you will be contacted by phone or by letter within the next 1-3 weeks. Call [phone]  if you have not heard about the biopsies in 3 weeks.  °Please also call with any specific questions about appointments or follow up tests. ° °

## 2017-12-15 NOTE — Op Note (Addendum)
[facility] Patient Name: Melinda Schroeder Procedure Date: 12/15/2017 MRN: [id number] Attending MD: Milus Banister , MD Date of Birth: 12-01-1940 CSN: [id number] Age: 77 Admit Type: Ambulatory Procedure:                Upper EUS Indications:              Gastric deformity on endoscopy/Subepithelial tumor                            versus extrinsic compression; gastric submucosal                            lesion noted 2016 Dr. Jari Sportsman EGD; EUS 2016                            confirmed size 1.4cm, FNA suggested spindle cells;                            was recommended to have surveillance EUS in 12                            months. 2017 repeat EUS showed same lesion had                            grown slightly to 1.6cm. Providers:                Milus Banister, MD, Carolynn Comment RN, RN,                            Charolette Child, Technician Referring MD:              Medicines:                Monitored Anesthesia Care Complications:            No immediate complications. Estimated blood loss:                            None. Estimated Blood Loss:     Estimated blood loss: none. Procedure:                Pre-Anesthesia Assessment:                           - Prior to the procedure, a History and Physical                            was performed, and patient medications and                            allergies were reviewed. The patient's tolerance of                            previous anesthesia was also reviewed. The risks                            and benefits of the  procedure and the sedation                            options and risks were discussed with the patient.                            All questions were answered, and informed consent                            was obtained. Prior Anticoagulants: The patient has                            taken no previous anticoagulant or antiplatelet                            agents. ASA Grade Assessment: III - A  patient with                            severe systemic disease. After reviewing the risks                            and benefits, the patient was deemed in                            satisfactory condition to undergo the procedure.                           After obtaining informed consent, the endoscope was                            passed under direct vision. Throughout the                            procedure, the patient's blood pressure, pulse, and                            oxygen saturations were monitored continuously. The                            was introduced through the mouth, and advanced to                            the duodenal bulb. The upper EUS was accomplished                            without difficulty. The patient tolerated the                            procedure well. Scope In: Scope Out: Findings:      ENDOSCOPIC FINDING: :      1. There was obvious candida (yellowish exudative patches throughout       entire esopahgus, most prominant in proximal esophagus. No esophageal       strictures.      2. The previously  noted gastric body, greater curvature submucosal       lesion was again located, seems slightly larger than during previous       EGDs (2016, 2017)      ENDOSONOGRAPHIC FINDING: :      1. The previously noted and sampled submucosal lesion in the gastric       body, greater curvature was located. It was lobular shaped, hypoechoic,       homomgeneous, clearly communicating with the muscularis propria layer of       the gastric wall. This measured 2.6cm across. The remaining gastric wall       appeared normal by EUS criteria.      2. No perigastric adenopathy.      3. Limited views of pancreas, liver, spleen, portal and splenic vessels       were all normal. Impression:               - Candida infection of the esophagus. I will call                            in appropriate diflucan treatment (10 days).                           - The previously  noted gastric submucosal lesion                            (2016 FNA suggested spindle cells) has grown from                            2016 (1.4cm) to today (2.6cm) and given that it is                            >2cm now, should be considered for surgical                            resection. I will communicate these results to Dr.                            Lyda Jester, will leave surgical referral to him                            but I'm happy to help refer to [facility] surgeon                            if wanted. Moderate Sedation:      N/A- Per Anesthesia Care Recommendation:           - Discharge patient to home (ambulatory). Procedure Code(s):        --- Professional ---                           [id number], Esophagogastroduodenoscopy, flexible,                            transoral; with endoscopic ultrasound examination  limited to the esophagus, stomach or duodenum, and                            adjacent structures Diagnosis Code(s):        --- Professional ---                           K29.70, Gastritis, unspecified, without bleeding                           K31.89, Other diseases of stomach and duodenum CPT copyright 2017 American Medical Association. All rights reserved. The codes documented in this report are preliminary and upon coder review may  be revised to meet current compliance requirements. Milus Banister, MD 12/15/2017 12:21:32 PM This report has been signed electronically. Number of Addenda: 0

## 2017-12-15 NOTE — H&P (Signed)
HPI: This is a 77 yo woman  Chief complaint is gastric mass  Noted in 2016, EUS in 2016 and 2017  ROS: complete GI ROS as described in HPI, all other review negative.  Constitutional:  No unintentional weight loss   Past Medical History:  Diagnosis Date  . Anginal pain ([place])    years ago  . Anxiety   . Bronchitis, allergic    11-27-15 MD visit -2 injections given , oral Levaquin at present.  [name] COPD (chronic obstructive pulmonary disease) ([place])   . Coronary artery disease    stent placed 15 years ago Dr Lia Foyer. Dr. Steward Ros seen 2 yrs ago- no recent problems or follow up cardiology visits.  . DDD (degenerative disc disease)   . Diverticulitis   . Esophageal dysmotilities   . Gastritis   . GERD (gastroesophageal reflux disease)   . Hypercholesterolemia   . Hypertension   . IBS (irritable bowel syndrome)   . Restless legs   . Sepsis ([place]) 02/2016  . Shortness of breath dyspnea     Past Surgical History:  Procedure Laterality Date  . ABDOMINAL HYSTERECTOMY     complete  . ANTERIOR CERVICAL DECOMP/DISCECTOMY FUSION N/A 06/08/2013   Procedure: Cervical Four Five anterior cervical decompression with fusion plating and bonegraft;  Surgeon: Winfield Cunas, MD;  Location: [facility] [facility];  Service: Neurosurgery;  Laterality: N/A;  ANTERIOR CERVICAL DECOMPRESSION/DISCECTOMY FUSION 1 LEVEL  . APPENDECTOMY    . BLADDER SURGERY    . breast mass right excision    . CORONARY STENT PLACEMENT    . EUS N/A 10/17/2014   Procedure: UPPER ENDOSCOPIC ULTRASOUND (EUS) LINEAR;  Surgeon: Milus Banister, MD;  Location: [facility] ENDOSCOPY;  Service: Endoscopy;  Laterality: N/A;  . EUS N/A 12/04/2015   Procedure: UPPER ENDOSCOPIC ULTRASOUND (EUS) RADIAL;  Surgeon: Milus Banister, MD;  Location: [facility] ENDOSCOPY;  Service: Endoscopy;  Laterality: N/A;  . HERNIA REPAIR    . LAMINECTOMY Left 02/11/2016   Procedure: Left C1-2 Tumor resection/C5-6 Posterior cervical fusion with lateral mass fixation;  Surgeon: Ashok Pall, MD;  Location: [place] [facility]URO ORS;  Service: Neurosurgery;  Laterality: Left;  Left C1-2 Tumor resection/C5-6 Posterior cervical fusion with lateral mass fixation   . shoulder sugery Left    rtc repair    Current Facility-Administered Medications  Medication Dose Route Frequency Provider Last Rate Last Dose  . 0.9 %  sodium chloride infusion   Intravenous Continuous Milus Banister, MD      . lactated ringers infusion   Intravenous Continuous Milus Banister, MD 10 mL/hr at 12/15/17 1102 1,000 mL at 12/15/17 1102    Allergies as of 11/24/2017 - Review Complete 06/30/2016  Allergen Reaction Noted  . Ace inhibitors Cough 12/21/2011  . Sulfa antibiotics Nausea Only 12/21/2011    Family History  Problem Relation Age of Onset  . Emphysema Father   . Stomach cancer Brother   . Stroke Mother     Social History   Socioeconomic History  . Marital status: Married    Spouse name: Not on file  . Number of children: 1  . Years of education: Not on file  . Highest education level: Not on file  Occupational History  . Occupation: hair stylist  Social Needs  . Financial resource strain: Not on file  . Food insecurity:    Worry: Not on file    Inability: Not on file  . Transportation needs:    Medical: Not on file  Non-medical: Not on file  Tobacco Use  . Smoking status: Current Every Day Smoker    Packs/day: 1.00    Years: 50.00    Pack years: 50.00    Types: Cigarettes  . Smokeless tobacco: Never Used  Substance and Sexual Activity  . Alcohol use: No  . Drug use: No  . Sexual activity: Not on file  Lifestyle  . Physical activity:    Days per week: Not on file    Minutes per session: Not on file  . Stress: Not on file  Relationships  . Social connections:    Talks on phone: Not on file    Gets together: Not on file    Attends religious service: Not on file    Active member of club or organization: Not on file    Attends meetings of clubs or organizations: Not  on file    Relationship status: Not on file  . Intimate partner violence:    Fear of current or ex partner: Not on file    Emotionally abused: Not on file    Physically abused: Not on file    Forced sexual activity: Not on file  Other Topics Concern  . Not on file  Social History Narrative  . 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  Please see the "Patient Instructions" section for addition details about the plan.  Owens Loffler, MD [facility] 12/15/2017, 11:05 AM

## 2017-12-15 NOTE — Anesthesia Postprocedure Evaluation (Signed)
Anesthesia Post Note  Patient: Melinda Schroeder  Procedure(s) Performed: UPPER ENDOSCOPIC ULTRASOUND (EUS) RADIAL (N/A )     Patient location during evaluation: PACU Anesthesia Type: MAC Level of consciousness: awake and alert and oriented Pain management: pain level controlled Vital Signs Assessment: post-procedure vital signs reviewed and stable Respiratory status: spontaneous breathing, nonlabored ventilation and respiratory function stable Cardiovascular status: stable and blood pressure returned to baseline Postop Assessment: no apparent nausea or vomiting Anesthetic complications: no    Last Vitals:  Vitals:   12/15/17 1049 12/15/17 1216  BP: (!) 158/65 (!) 111/38  Pulse: 66 72  Resp: 15 18  Temp: 36.7 C 37.2 C  SpO2: 95% 93%    Last Pain:  Vitals:   12/15/17 1216  TempSrc: Oral  PainSc: 0-No pain                 Raiquan Chandler A.

## 2017-12-16 ENCOUNTER — Encounter (HOSPITAL_COMMUNITY): Payer: Self-pay | Admitting: Gastroenterology

## 2017-12-26 DIAGNOSIS — K319 Disease of stomach and duodenum, unspecified: Secondary | ICD-10-CM | POA: Diagnosis not present

## 2017-12-26 DIAGNOSIS — I2510 Atherosclerotic heart disease of native coronary artery without angina pectoris: Secondary | ICD-10-CM | POA: Diagnosis not present

## 2017-12-28 DIAGNOSIS — J9811 Atelectasis: Secondary | ICD-10-CM | POA: Diagnosis not present

## 2017-12-28 DIAGNOSIS — E871 Hypo-osmolality and hyponatremia: Secondary | ICD-10-CM | POA: Diagnosis not present

## 2017-12-28 DIAGNOSIS — R569 Unspecified convulsions: Secondary | ICD-10-CM | POA: Diagnosis not present

## 2017-12-28 DIAGNOSIS — J9601 Acute respiratory failure with hypoxia: Secondary | ICD-10-CM | POA: Diagnosis not present

## 2017-12-28 DIAGNOSIS — E872 Acidosis: Secondary | ICD-10-CM | POA: Diagnosis not present

## 2017-12-28 DIAGNOSIS — J690 Pneumonitis due to inhalation of food and vomit: Secondary | ICD-10-CM | POA: Diagnosis not present

## 2017-12-28 DIAGNOSIS — G9341 Metabolic encephalopathy: Secondary | ICD-10-CM | POA: Diagnosis not present

## 2017-12-28 DIAGNOSIS — Z4682 Encounter for fitting and adjustment of non-vascular catheter: Secondary | ICD-10-CM | POA: Diagnosis not present

## 2017-12-28 DIAGNOSIS — J9600 Acute respiratory failure, unspecified whether with hypoxia or hypercapnia: Secondary | ICD-10-CM | POA: Diagnosis not present

## 2017-12-28 DIAGNOSIS — J189 Pneumonia, unspecified organism: Secondary | ICD-10-CM | POA: Diagnosis not present

## 2017-12-28 DIAGNOSIS — J441 Chronic obstructive pulmonary disease with (acute) exacerbation: Secondary | ICD-10-CM | POA: Diagnosis not present

## 2017-12-28 DIAGNOSIS — R918 Other nonspecific abnormal finding of lung field: Secondary | ICD-10-CM | POA: Diagnosis not present

## 2017-12-28 DIAGNOSIS — R0602 Shortness of breath: Secondary | ICD-10-CM | POA: Diagnosis not present

## 2017-12-28 DIAGNOSIS — N179 Acute kidney failure, unspecified: Secondary | ICD-10-CM | POA: Diagnosis not present

## 2017-12-28 DIAGNOSIS — Z720 Tobacco use: Secondary | ICD-10-CM | POA: Diagnosis not present

## 2017-12-28 DIAGNOSIS — I639 Cerebral infarction, unspecified: Secondary | ICD-10-CM | POA: Diagnosis not present

## 2017-12-28 DIAGNOSIS — A419 Sepsis, unspecified organism: Secondary | ICD-10-CM | POA: Diagnosis not present

## 2017-12-28 DIAGNOSIS — I6389 Other cerebral infarction: Secondary | ICD-10-CM | POA: Diagnosis not present

## 2017-12-28 DIAGNOSIS — R6521 Severe sepsis with septic shock: Secondary | ICD-10-CM | POA: Diagnosis not present

## 2017-12-28 DIAGNOSIS — Z452 Encounter for adjustment and management of vascular access device: Secondary | ICD-10-CM | POA: Diagnosis not present

## 2017-12-30 DIAGNOSIS — A419 Sepsis, unspecified organism: Secondary | ICD-10-CM | POA: Diagnosis not present

## 2017-12-30 DIAGNOSIS — J441 Chronic obstructive pulmonary disease with (acute) exacerbation: Secondary | ICD-10-CM | POA: Diagnosis not present

## 2017-12-30 DIAGNOSIS — R569 Unspecified convulsions: Secondary | ICD-10-CM | POA: Diagnosis not present

## 2018-01-02 ENCOUNTER — Ambulatory Visit: Payer: Self-pay | Admitting: Cardiology

## 2018-01-04 ENCOUNTER — Inpatient Hospital Stay (HOSPITAL_COMMUNITY): Payer: Medicare HMO

## 2018-01-04 ENCOUNTER — Inpatient Hospital Stay (HOSPITAL_COMMUNITY)
Admission: AD | Admit: 2018-01-04 | Discharge: 2018-02-11 | DRG: 064 | Disposition: E | Payer: Medicare HMO | Source: Other Acute Inpatient Hospital | Attending: Pulmonary Disease | Admitting: Pulmonary Disease

## 2018-01-04 DIAGNOSIS — Z823 Family history of stroke: Secondary | ICD-10-CM

## 2018-01-04 DIAGNOSIS — R109 Unspecified abdominal pain: Secondary | ICD-10-CM

## 2018-01-04 DIAGNOSIS — R739 Hyperglycemia, unspecified: Secondary | ICD-10-CM | POA: Diagnosis not present

## 2018-01-04 DIAGNOSIS — Z01818 Encounter for other preprocedural examination: Secondary | ICD-10-CM

## 2018-01-04 DIAGNOSIS — E873 Alkalosis: Secondary | ICD-10-CM | POA: Diagnosis not present

## 2018-01-04 DIAGNOSIS — I63432 Cerebral infarction due to embolism of left posterior cerebral artery: Secondary | ICD-10-CM | POA: Diagnosis not present

## 2018-01-04 DIAGNOSIS — I639 Cerebral infarction, unspecified: Secondary | ICD-10-CM

## 2018-01-04 DIAGNOSIS — Z4682 Encounter for fitting and adjustment of non-vascular catheter: Secondary | ICD-10-CM | POA: Diagnosis not present

## 2018-01-04 DIAGNOSIS — F17210 Nicotine dependence, cigarettes, uncomplicated: Secondary | ICD-10-CM | POA: Diagnosis present

## 2018-01-04 DIAGNOSIS — E785 Hyperlipidemia, unspecified: Secondary | ICD-10-CM | POA: Diagnosis present

## 2018-01-04 DIAGNOSIS — J9601 Acute respiratory failure with hypoxia: Secondary | ICD-10-CM | POA: Diagnosis not present

## 2018-01-04 DIAGNOSIS — I6521 Occlusion and stenosis of right carotid artery: Secondary | ICD-10-CM | POA: Diagnosis present

## 2018-01-04 DIAGNOSIS — R402343 Coma scale, best motor response, flexion withdrawal, at hospital admission: Secondary | ICD-10-CM | POA: Diagnosis not present

## 2018-01-04 DIAGNOSIS — A0472 Enterocolitis due to Clostridium difficile, not specified as recurrent: Secondary | ICD-10-CM | POA: Diagnosis not present

## 2018-01-04 DIAGNOSIS — Z7189 Other specified counseling: Secondary | ICD-10-CM

## 2018-01-04 DIAGNOSIS — G2581 Restless legs syndrome: Secondary | ICD-10-CM | POA: Diagnosis not present

## 2018-01-04 DIAGNOSIS — G9340 Encephalopathy, unspecified: Secondary | ICD-10-CM | POA: Diagnosis present

## 2018-01-04 DIAGNOSIS — I4581 Long QT syndrome: Secondary | ICD-10-CM | POA: Diagnosis not present

## 2018-01-04 DIAGNOSIS — Z7982 Long term (current) use of aspirin: Secondary | ICD-10-CM

## 2018-01-04 DIAGNOSIS — Z981 Arthrodesis status: Secondary | ICD-10-CM

## 2018-01-04 DIAGNOSIS — R846 Abnormal cytological findings in specimens from respiratory organs and thorax: Secondary | ICD-10-CM | POA: Diagnosis not present

## 2018-01-04 DIAGNOSIS — J9811 Atelectasis: Secondary | ICD-10-CM | POA: Diagnosis not present

## 2018-01-04 DIAGNOSIS — Z888 Allergy status to other drugs, medicaments and biological substances status: Secondary | ICD-10-CM

## 2018-01-04 DIAGNOSIS — F419 Anxiety disorder, unspecified: Secondary | ICD-10-CM | POA: Diagnosis present

## 2018-01-04 DIAGNOSIS — R569 Unspecified convulsions: Secondary | ICD-10-CM | POA: Diagnosis present

## 2018-01-04 DIAGNOSIS — Z9049 Acquired absence of other specified parts of digestive tract: Secondary | ICD-10-CM

## 2018-01-04 DIAGNOSIS — J441 Chronic obstructive pulmonary disease with (acute) exacerbation: Secondary | ICD-10-CM | POA: Diagnosis not present

## 2018-01-04 DIAGNOSIS — Z800 Family history of malignant neoplasm of digestive organs: Secondary | ICD-10-CM

## 2018-01-04 DIAGNOSIS — Z8673 Personal history of transient ischemic attack (TIA), and cerebral infarction without residual deficits: Secondary | ICD-10-CM

## 2018-01-04 DIAGNOSIS — R402123 Coma scale, eyes open, to pain, at hospital admission: Secondary | ICD-10-CM | POA: Diagnosis present

## 2018-01-04 DIAGNOSIS — I63412 Cerebral infarction due to embolism of left middle cerebral artery: Principal | ICD-10-CM | POA: Diagnosis present

## 2018-01-04 DIAGNOSIS — K219 Gastro-esophageal reflux disease without esophagitis: Secondary | ICD-10-CM | POA: Diagnosis present

## 2018-01-04 DIAGNOSIS — I4891 Unspecified atrial fibrillation: Secondary | ICD-10-CM | POA: Diagnosis not present

## 2018-01-04 DIAGNOSIS — Z4659 Encounter for fitting and adjustment of other gastrointestinal appliance and device: Secondary | ICD-10-CM

## 2018-01-04 DIAGNOSIS — Z515 Encounter for palliative care: Secondary | ICD-10-CM | POA: Diagnosis not present

## 2018-01-04 DIAGNOSIS — R4182 Altered mental status, unspecified: Secondary | ICD-10-CM | POA: Diagnosis not present

## 2018-01-04 DIAGNOSIS — I2510 Atherosclerotic heart disease of native coronary artery without angina pectoris: Secondary | ICD-10-CM | POA: Diagnosis not present

## 2018-01-04 DIAGNOSIS — K5730 Diverticulosis of large intestine without perforation or abscess without bleeding: Secondary | ICD-10-CM | POA: Diagnosis present

## 2018-01-04 DIAGNOSIS — M7989 Other specified soft tissue disorders: Secondary | ICD-10-CM | POA: Diagnosis not present

## 2018-01-04 DIAGNOSIS — Y95 Nosocomial condition: Secondary | ICD-10-CM | POA: Diagnosis present

## 2018-01-04 DIAGNOSIS — I714 Abdominal aortic aneurysm, without rupture: Secondary | ICD-10-CM | POA: Diagnosis present

## 2018-01-04 DIAGNOSIS — D649 Anemia, unspecified: Secondary | ICD-10-CM | POA: Diagnosis present

## 2018-01-04 DIAGNOSIS — J690 Pneumonitis due to inhalation of food and vomit: Secondary | ICD-10-CM | POA: Diagnosis not present

## 2018-01-04 DIAGNOSIS — Z90710 Acquired absence of both cervix and uterus: Secondary | ICD-10-CM

## 2018-01-04 DIAGNOSIS — I6789 Other cerebrovascular disease: Secondary | ICD-10-CM | POA: Diagnosis present

## 2018-01-04 DIAGNOSIS — N179 Acute kidney failure, unspecified: Secondary | ICD-10-CM | POA: Diagnosis present

## 2018-01-04 DIAGNOSIS — Z66 Do not resuscitate: Secondary | ICD-10-CM | POA: Diagnosis not present

## 2018-01-04 DIAGNOSIS — J189 Pneumonia, unspecified organism: Secondary | ICD-10-CM

## 2018-01-04 DIAGNOSIS — E876 Hypokalemia: Secondary | ICD-10-CM | POA: Diagnosis present

## 2018-01-04 DIAGNOSIS — R402213 Coma scale, best verbal response, none, at hospital admission: Secondary | ICD-10-CM | POA: Diagnosis not present

## 2018-01-04 DIAGNOSIS — I10 Essential (primary) hypertension: Secondary | ICD-10-CM | POA: Diagnosis present

## 2018-01-04 DIAGNOSIS — G9341 Metabolic encephalopathy: Secondary | ICD-10-CM | POA: Diagnosis not present

## 2018-01-04 DIAGNOSIS — K224 Dyskinesia of esophagus: Secondary | ICD-10-CM | POA: Diagnosis present

## 2018-01-04 DIAGNOSIS — Z955 Presence of coronary angioplasty implant and graft: Secondary | ICD-10-CM

## 2018-01-04 DIAGNOSIS — J181 Lobar pneumonia, unspecified organism: Secondary | ICD-10-CM | POA: Diagnosis not present

## 2018-01-04 DIAGNOSIS — Z8661 Personal history of infections of the central nervous system: Secondary | ICD-10-CM

## 2018-01-04 DIAGNOSIS — R918 Other nonspecific abnormal finding of lung field: Secondary | ICD-10-CM | POA: Diagnosis not present

## 2018-01-04 DIAGNOSIS — I6389 Other cerebral infarction: Secondary | ICD-10-CM | POA: Diagnosis not present

## 2018-01-04 DIAGNOSIS — Z825 Family history of asthma and other chronic lower respiratory diseases: Secondary | ICD-10-CM

## 2018-01-04 DIAGNOSIS — J432 Centrilobular emphysema: Secondary | ICD-10-CM | POA: Diagnosis present

## 2018-01-04 DIAGNOSIS — I959 Hypotension, unspecified: Secondary | ICD-10-CM | POA: Diagnosis present

## 2018-01-04 DIAGNOSIS — J90 Pleural effusion, not elsewhere classified: Secondary | ICD-10-CM | POA: Diagnosis not present

## 2018-01-04 DIAGNOSIS — I6340 Cerebral infarction due to embolism of unspecified cerebral artery: Secondary | ICD-10-CM

## 2018-01-04 DIAGNOSIS — I700 Atherosclerosis of aorta: Secondary | ICD-10-CM | POA: Diagnosis not present

## 2018-01-04 DIAGNOSIS — R29720 NIHSS score 20: Secondary | ICD-10-CM | POA: Diagnosis not present

## 2018-01-04 DIAGNOSIS — Z882 Allergy status to sulfonamides status: Secondary | ICD-10-CM

## 2018-01-04 LAB — COMPREHENSIVE METABOLIC PANEL
ALT: 22 U/L (ref 14–54)
AST: 17 U/L (ref 15–41)
Albumin: 2.9 g/dL — ABNORMAL LOW (ref 3.5–5.0)
Alkaline Phosphatase: 45 U/L (ref 38–126)
Anion gap: 14 (ref 5–15)
BUN: 20 mg/dL (ref 6–20)
CALCIUM: 7.9 mg/dL — AB (ref 8.9–10.3)
CHLORIDE: 93 mmol/L — AB (ref 101–111)
CO2: 28 mmol/L (ref 22–32)
CREATININE: 0.86 mg/dL (ref 0.44–1.00)
Glucose, Bld: 210 mg/dL — ABNORMAL HIGH (ref 65–99)
Potassium: 2.9 mmol/L — ABNORMAL LOW (ref 3.5–5.1)
Sodium: 135 mmol/L (ref 135–145)
Total Bilirubin: 0.9 mg/dL (ref 0.3–1.2)
Total Protein: 5.5 g/dL — ABNORMAL LOW (ref 6.5–8.1)

## 2018-01-04 LAB — CBC
HEMATOCRIT: 31.4 % — AB (ref 36.0–46.0)
HEMOGLOBIN: 10.5 g/dL — AB (ref 12.0–15.0)
MCH: 29.4 pg (ref 26.0–34.0)
MCHC: 33.4 g/dL (ref 30.0–36.0)
MCV: 88 fL (ref 78.0–100.0)
Platelets: 290 10*3/uL (ref 150–400)
RBC: 3.57 MIL/uL — ABNORMAL LOW (ref 3.87–5.11)
RDW: 14.5 % (ref 11.5–15.5)
WBC: 13.6 10*3/uL — ABNORMAL HIGH (ref 4.0–10.5)

## 2018-01-04 LAB — POCT I-STAT 3, ART BLOOD GAS (G3+)
ACID-BASE EXCESS: 10 mmol/L — AB (ref 0.0–2.0)
BICARBONATE: 34 mmol/L — AB (ref 20.0–28.0)
O2 Saturation: 98 %
PO2 ART: 92 mmHg (ref 83.0–108.0)
Patient temperature: 98.6
TCO2: 35 mmol/L — AB (ref 22–32)
pCO2 arterial: 43.2 mmHg (ref 32.0–48.0)
pH, Arterial: 7.504 — ABNORMAL HIGH (ref 7.350–7.450)

## 2018-01-04 LAB — GLUCOSE, CAPILLARY
GLUCOSE-CAPILLARY: 212 mg/dL — AB (ref 65–99)
GLUCOSE-CAPILLARY: 196 mg/dL — AB (ref 65–99)
Glucose-Capillary: 150 mg/dL — ABNORMAL HIGH (ref 65–99)
Glucose-Capillary: 184 mg/dL — ABNORMAL HIGH (ref 65–99)

## 2018-01-04 LAB — MRSA PCR SCREENING: MRSA by PCR: POSITIVE — AB

## 2018-01-04 LAB — PROCALCITONIN: Procalcitonin: 0.33 ng/mL

## 2018-01-04 LAB — TRIGLYCERIDES: TRIGLYCERIDES: 154 mg/dL — AB (ref <150)

## 2018-01-04 MED ORDER — METOPROLOL TARTRATE 5 MG/5ML IV SOLN
2.5000 mg | INTRAVENOUS | Status: DC | PRN
  Administered 2018-01-05 – 2018-01-11 (×8): 5 mg via INTRAVENOUS
  Administered 2018-01-12: 2.5 mg via INTRAVENOUS
  Filled 2018-01-04 (×11): qty 5

## 2018-01-04 MED ORDER — CHLORHEXIDINE GLUCONATE 0.12% ORAL RINSE (MEDLINE KIT)
15.0000 mL | Freq: Two times a day (BID) | OROMUCOSAL | Status: DC
  Administered 2018-01-04 – 2018-01-13 (×19): 15 mL via OROMUCOSAL

## 2018-01-04 MED ORDER — METOPROLOL TARTRATE 25 MG PO TABS
25.0000 mg | ORAL_TABLET | Freq: Two times a day (BID) | ORAL | Status: DC
  Administered 2018-01-04: 25 mg via ORAL
  Filled 2018-01-04: qty 1

## 2018-01-04 MED ORDER — PRO-STAT SUGAR FREE PO LIQD
30.0000 mL | Freq: Every day | ORAL | Status: DC
  Administered 2018-01-05 – 2018-01-11 (×7): 30 mL
  Filled 2018-01-04 (×7): qty 30

## 2018-01-04 MED ORDER — PROPOFOL 1000 MG/100ML IV EMUL
0.0000 ug/kg/min | INTRAVENOUS | Status: DC
  Administered 2018-01-04: 5 ug/kg/min via INTRAVENOUS
  Administered 2018-01-04: 30 ug/kg/min via INTRAVENOUS
  Administered 2018-01-04 (×2): 50 ug/kg/min via INTRAVENOUS
  Administered 2018-01-05 (×2): 40 ug/kg/min via INTRAVENOUS
  Filled 2018-01-04 (×2): qty 100
  Filled 2018-01-04: qty 200
  Filled 2018-01-04 (×4): qty 100

## 2018-01-04 MED ORDER — FENTANYL CITRATE (PF) 100 MCG/2ML IJ SOLN
25.0000 ug | INTRAMUSCULAR | Status: DC | PRN
  Administered 2018-01-04 – 2018-01-13 (×6): 25 ug via INTRAVENOUS
  Filled 2018-01-04 (×6): qty 2

## 2018-01-04 MED ORDER — CHLORHEXIDINE GLUCONATE CLOTH 2 % EX PADS
6.0000 | MEDICATED_PAD | Freq: Every day | CUTANEOUS | Status: AC
  Administered 2018-01-05 – 2018-01-07 (×5): 6 via TOPICAL

## 2018-01-04 MED ORDER — PRO-STAT SUGAR FREE PO LIQD
30.0000 mL | Freq: Two times a day (BID) | ORAL | Status: DC
  Administered 2018-01-04: 30 mL
  Filled 2018-01-04: qty 30

## 2018-01-04 MED ORDER — HYDRALAZINE HCL 20 MG/ML IJ SOLN
10.0000 mg | INTRAMUSCULAR | Status: DC | PRN
  Administered 2018-01-04: 40 mg via INTRAVENOUS
  Administered 2018-01-05 – 2018-01-09 (×5): 20 mg via INTRAVENOUS
  Filled 2018-01-04: qty 1
  Filled 2018-01-04: qty 2
  Filled 2018-01-04 (×5): qty 1

## 2018-01-04 MED ORDER — LEVALBUTEROL HCL 0.63 MG/3ML IN NEBU
0.6300 mg | INHALATION_SOLUTION | RESPIRATORY_TRACT | Status: DC | PRN
  Administered 2018-01-11: 0.63 mg via RESPIRATORY_TRACT
  Filled 2018-01-04: qty 3

## 2018-01-04 MED ORDER — GUAIFENESIN 100 MG/5ML PO SOLN
15.0000 mL | Freq: Two times a day (BID) | ORAL | Status: DC
  Administered 2018-01-04 – 2018-01-11 (×15): 300 mg
  Filled 2018-01-04: qty 15
  Filled 2018-01-04: qty 5
  Filled 2018-01-04 (×5): qty 15
  Filled 2018-01-04: qty 5
  Filled 2018-01-04: qty 10
  Filled 2018-01-04 (×2): qty 15
  Filled 2018-01-04: qty 5
  Filled 2018-01-04 (×3): qty 15
  Filled 2018-01-04: qty 10
  Filled 2018-01-04: qty 15
  Filled 2018-01-04: qty 5
  Filled 2018-01-04: qty 15

## 2018-01-04 MED ORDER — METOPROLOL TARTRATE 50 MG PO TABS
50.0000 mg | ORAL_TABLET | Freq: Two times a day (BID) | ORAL | Status: DC
  Administered 2018-01-04 – 2018-01-11 (×14): 50 mg via ORAL
  Filled 2018-01-04 (×14): qty 1

## 2018-01-04 MED ORDER — VITAL HIGH PROTEIN PO LIQD
1000.0000 mL | ORAL | Status: DC
  Administered 2018-01-04 – 2018-01-05 (×2): 1000 mL
  Administered 2018-01-05: 08:00:00
  Administered 2018-01-05 – 2018-01-07 (×4): 1000 mL

## 2018-01-04 MED ORDER — MIDAZOLAM HCL 2 MG/2ML IJ SOLN
INTRAMUSCULAR | Status: AC
  Administered 2018-01-04: 2 mg
  Filled 2018-01-04: qty 2

## 2018-01-04 MED ORDER — POTASSIUM CHLORIDE 20 MEQ PO PACK
40.0000 meq | PACK | Freq: Two times a day (BID) | ORAL | Status: AC
  Administered 2018-01-04 – 2018-01-05 (×2): 40 meq
  Filled 2018-01-04 (×2): qty 2

## 2018-01-04 MED ORDER — PIPERACILLIN-TAZOBACTAM 3.375 G IVPB
3.3750 g | Freq: Once | INTRAVENOUS | Status: AC
  Administered 2018-01-04: 3.375 g via INTRAVENOUS
  Filled 2018-01-04: qty 50

## 2018-01-04 MED ORDER — MIRTAZAPINE 15 MG PO TABS
15.0000 mg | ORAL_TABLET | Freq: Every day | ORAL | Status: DC
  Administered 2018-01-04: 15 mg via ORAL
  Filled 2018-01-04: qty 1

## 2018-01-04 MED ORDER — NICOTINE 14 MG/24HR TD PT24
14.0000 mg | MEDICATED_PATCH | Freq: Every day | TRANSDERMAL | Status: DC
  Administered 2018-01-04 – 2018-01-15 (×12): 14 mg via TRANSDERMAL
  Filled 2018-01-04 (×12): qty 1

## 2018-01-04 MED ORDER — PAROXETINE HCL 20 MG PO TABS
20.0000 mg | ORAL_TABLET | Freq: Every morning | ORAL | Status: DC
  Administered 2018-01-04 – 2018-01-13 (×10): 20 mg via ORAL
  Filled 2018-01-04 (×10): qty 1

## 2018-01-04 MED ORDER — EMPTY CONTAINERS FLEXIBLE MISC
0.0000 ug/h | Status: DC
  Administered 2018-01-04: 200 ug/h via INTRAVENOUS
  Administered 2018-01-05: 170 ug/h via INTRAVENOUS
  Administered 2018-01-06: 25 ug/h via INTRAVENOUS
  Administered 2018-01-07: 100 ug/h via INTRAVENOUS
  Administered 2018-01-08: 275 ug/h via INTRAVENOUS
  Administered 2018-01-09: 150 ug/h via INTRAVENOUS
  Administered 2018-01-11: 75 ug/h via INTRAVENOUS
  Filled 2018-01-04 (×6): qty 100

## 2018-01-04 MED ORDER — ORAL CARE MOUTH RINSE
15.0000 mL | OROMUCOSAL | Status: DC
  Administered 2018-01-04 – 2018-01-13 (×89): 15 mL via OROMUCOSAL

## 2018-01-04 MED ORDER — SODIUM CHLORIDE 0.9 % IV SOLN
250.0000 mL | INTRAVENOUS | Status: DC | PRN
  Administered 2018-01-06: 250 mL via INTRAVENOUS

## 2018-01-04 MED ORDER — PIPERACILLIN-TAZOBACTAM 3.375 G IVPB
3.3750 g | Freq: Three times a day (TID) | INTRAVENOUS | Status: DC
Start: 2018-01-04 — End: 2018-01-09
  Administered 2018-01-04 – 2018-01-09 (×14): 3.375 g via INTRAVENOUS
  Filled 2018-01-04 (×15): qty 50

## 2018-01-04 MED ORDER — ONDANSETRON HCL 4 MG/2ML IJ SOLN
4.0000 mg | Freq: Four times a day (QID) | INTRAMUSCULAR | Status: DC | PRN
  Administered 2018-01-12: 4 mg via INTRAVENOUS
  Filled 2018-01-04: qty 2

## 2018-01-04 MED ORDER — SIMVASTATIN 40 MG PO TABS
40.0000 mg | ORAL_TABLET | Freq: Every day | ORAL | Status: DC
  Administered 2018-01-04 – 2018-01-06 (×3): 40 mg via ORAL
  Filled 2018-01-04 (×3): qty 1

## 2018-01-04 MED ORDER — FENTANYL CITRATE (PF) 100 MCG/2ML IJ SOLN
100.0000 ug | Freq: Once | INTRAMUSCULAR | Status: DC
  Filled 2018-01-04: qty 2

## 2018-01-04 MED ORDER — SODIUM CHLORIDE 0.9 % IV SOLN
INTRAVENOUS | Status: DC
  Administered 2018-01-04: 50 mL/h via INTRAVENOUS

## 2018-01-04 MED ORDER — FAMOTIDINE 40 MG/5ML PO SUSR
20.0000 mg | Freq: Two times a day (BID) | ORAL | Status: DC
  Administered 2018-01-04 – 2018-01-13 (×19): 20 mg
  Filled 2018-01-04 (×19): qty 2.5

## 2018-01-04 MED ORDER — ADULT MULTIVITAMIN LIQUID CH
15.0000 mL | Freq: Every day | ORAL | Status: DC
  Administered 2018-01-04 – 2018-01-13 (×10): 15 mL
  Filled 2018-01-04 (×10): qty 15

## 2018-01-04 MED ORDER — INSULIN ASPART 100 UNIT/ML ~~LOC~~ SOLN
0.0000 [IU] | SUBCUTANEOUS | Status: DC
  Administered 2018-01-04: 2 [IU] via SUBCUTANEOUS
  Administered 2018-01-04: 3 [IU] via SUBCUTANEOUS
  Administered 2018-01-04: 2 [IU] via SUBCUTANEOUS
  Administered 2018-01-04: 1 [IU] via SUBCUTANEOUS
  Administered 2018-01-05 (×3): 2 [IU] via SUBCUTANEOUS
  Administered 2018-01-05: 1 [IU] via SUBCUTANEOUS
  Administered 2018-01-05: 2 [IU] via SUBCUTANEOUS
  Administered 2018-01-06 (×4): 1 [IU] via SUBCUTANEOUS
  Administered 2018-01-06: 3 [IU] via SUBCUTANEOUS
  Administered 2018-01-06 – 2018-01-07 (×4): 2 [IU] via SUBCUTANEOUS
  Administered 2018-01-07: 1 [IU] via SUBCUTANEOUS
  Administered 2018-01-07 – 2018-01-08 (×6): 2 [IU] via SUBCUTANEOUS
  Administered 2018-01-08: 1 [IU] via SUBCUTANEOUS
  Administered 2018-01-08 – 2018-01-09 (×3): 2 [IU] via SUBCUTANEOUS
  Administered 2018-01-09: 1 [IU] via SUBCUTANEOUS
  Administered 2018-01-09 (×3): 2 [IU] via SUBCUTANEOUS
  Administered 2018-01-10: 1 [IU] via SUBCUTANEOUS
  Administered 2018-01-10 – 2018-01-11 (×11): 2 [IU] via SUBCUTANEOUS
  Administered 2018-01-12 (×4): 1 [IU] via SUBCUTANEOUS
  Administered 2018-01-13: 2 [IU] via SUBCUTANEOUS
  Administered 2018-01-13 (×2): 1 [IU] via SUBCUTANEOUS

## 2018-01-04 MED ORDER — DOCUSATE SODIUM 50 MG/5ML PO LIQD: 100.0000 mg | Freq: Two times a day (BID) | ORAL | Status: DC | PRN

## 2018-01-04 MED ORDER — MUPIROCIN 2 % EX OINT
1.0000 "application " | TOPICAL_OINTMENT | Freq: Two times a day (BID) | CUTANEOUS | Status: AC
  Administered 2018-01-04 – 2018-01-09 (×10): 1 via NASAL
  Filled 2018-01-04: qty 22

## 2018-01-04 NOTE — Progress Notes (Signed)
Pharmacy Antibiotic Note  Melinda Schroeder is a 77 y.o. female admitted on 01/10/2018 with aspiration PNA.  Pharmacy has been consulted to continue Zosyn dosing - noted on zosyn and vancomycin PTA, last dose of zosyn 4.5g over 30 minutes today at 0515. SCr pending.  Plan: F/u SCr for further Zosyn dosing Monitor clinical progress, c/s, renal function F/u de-escalation plan/LOT  Height: 5\' 1"  (154.9 cm) Weight: 149 lb 14.6 oz (68 kg) IBW/kg (Calculated) : 47.8  Temp (24hrs), Avg:98.6 F (37 C), Min:98.6 F (37 C), Max:98.6 F (37 C)  Recent Labs  Lab 12/19/2017 1200  WBC 13.6*    CrCl cannot be calculated (Patient's most recent lab result is older than the maximum 21 days allowed.).    Allergies  Allergen Reactions  . Ace Inhibitors Cough  . Sulfa Antibiotics Nausea Only    Elicia Lamp, PharmD, BCPS Clinical Pharmacist 12/27/2017 2:51 PM

## 2018-01-04 NOTE — Progress Notes (Signed)
Initial Nutrition Assessment  DOCUMENTATION CODES:   Not applicable  INTERVENTION:   Vital High Protein @ 40 ml/hr (960 ml/day) 30 ml Prostat daily  Provides: 1060 kcal, 99 grams protein, and 802 ml free water.  TF regimen and propofol at current rate providing 1382 total kcal/day (98 % of kcal needs)   NUTRITION DIAGNOSIS:   Inadequate oral intake related to inability to eat as evidenced by NPO status.  GOAL:   Patient will meet greater than or equal to 90% of their needs  MONITOR:   TF tolerance, Vent status, I & O's  REASON FOR ASSESSMENT:   Consult Enteral/tube feeding initiation and management  ASSESSMENT:   Pt with PMH of COPD, IBS, esophageal dysmotility who was transferred to [facility].    Pt discussed during ICU rounds and with RN.  Per RN report (no H&P available at time of note) pt with extensive PMH admitted to [facility] 4/13 with seizures and intubated. Pt transferred to [facility] for MRI. On admission pt on propofol @ 65 mcg and restraints for documented agitation. Pt now off all sedation.   Pt just admitted and NP/MD with pt and family, unable to complete NFPE at this time.   Patient is currently intubated on ventilator support MV: 12 L/min Temp (24hrs), Avg:98.6 F (37 C), Min:98.6 F (37 C), Max:98.6 F (37 C)  Propofol: 12.2 ml/hr provides: 322 kcal (turned back on 1 pm)  Medications reviewed and include: remeron  Labs reviewed: TG: 154 Per chart review pt's weight was 106-112 lb but no recent weight hx available  NUTRITION - FOCUSED PHYSICAL EXAM:  Unable to complete Nutrition-Focused physical exam at this time.   Diet Order:  Diet NPO time specified  EDUCATION NEEDS:   No education needs have been identified at this time  Skin:  Skin Assessment: Reviewed RN Assessment(per RN pt's skin is very thin )  Last BM:  unknown  Height:   Ht Readings from Last 1 Encounters:  12/21/2017 5\' 1"  (1.549 m)    Weight:   Wt Readings from Last 1  Encounters:  01/05/2018 149 lb 14.6 oz (68 kg)    Ideal Body Weight:  47.7 kg  BMI:  Body mass index is 28.33 kg/m.  Estimated Nutritional Needs:   Kcal:  1411  Protein:  95-110 grams  Fluid:  > 1.5 L/day  Maylon Peppers RD, LDN, CNSC [phone] Pager [phone] After Hours Pager

## 2018-01-04 NOTE — Consult Note (Signed)
Requesting Physician: Dr. Pearline Cables    Chief Complaint: Melinda Schroeder state   History obtained from: Chart review HPI:                                                                                                                                       Melinda Schroeder is an 77 y.o. female with HTN, HLD, CAD, COPD presented to [facility] on 4.16 after family called ME Sfro respiratory distress. Per chart, she became agonal and had possible seizure like activity. The patient was intubated upon arrival to the emergency room. She was given steroids and bronchodilators and continued to have AMS while trying to wean off intubation. Repeat Ct head showed a left PCA infarct and and patient was transferred as they are unable to get MRI on ventilator assist patients.  On arrival, patient intuabted and deeply sedated.After weaning of sedation patient became more responsive and withdraws to painful stimuli.    Past Medical History:  Diagnosis Date  . Anginal pain ([place])    years ago  . Anxiety   . Bronchitis, allergic    11-27-15 MD visit -2 injections given , oral Levaquin at present.  [name] COPD (chronic obstructive pulmonary disease) ([place])   . Coronary artery disease    stent placed 15 years ago Dr Lia Foyer. Dr. Steward Ros seen 2 yrs ago- no recent problems or follow up cardiology visits.  . DDD (degenerative disc disease)   . Diverticulitis   . Esophageal dysmotilities   . Gastritis   . GERD (gastroesophageal reflux disease)   . Hypercholesterolemia   . Hypertension   . IBS (irritable bowel syndrome)   . Restless legs   . Sepsis ([place]) 02/2016  . Shortness of breath dyspnea     Past Surgical History:  Procedure Laterality Date  . ABDOMINAL HYSTERECTOMY     complete  . ANTERIOR CERVICAL DECOMP/DISCECTOMY FUSION N/A 06/08/2013   Procedure: Cervical Four Five anterior cervical decompression with fusion plating and bonegraft;  Surgeon: Winfield Cunas, MD;  Location: [place] [facility];  Service: Neurosurgery;   Laterality: N/A;  ANTERIOR CERVICAL DECOMPRESSION/DISCECTOMY FUSION 1 LEVEL  . APPENDECTOMY    . BLADDER SURGERY    . breast mass right excision    . CORONARY STENT PLACEMENT    . EUS N/A 10/17/2014   Procedure: UPPER ENDOSCOPIC ULTRASOUND (EUS) LINEAR;  Surgeon: Milus Banister, MD;  Location: [facility];  Service: Endoscopy;  Laterality: N/A;  . EUS N/A 12/04/2015   Procedure: UPPER ENDOSCOPIC ULTRASOUND (EUS) RADIAL;  Surgeon: Milus Banister, MD;  Location: [facility] ENDOSCOPY;  Service: Endoscopy;  Laterality: N/A;  . EUS N/A 12/15/2017   Procedure: UPPER ENDOSCOPIC ULTRASOUND (EUS) RADIAL;  Surgeon: Milus Banister, MD;  Location: [facility] ENDOS[facility];  Service: Endoscopy;  Laterality: N/A;  . HERNIA REPAIR    . LAMINECTOMY Left 02/11/2016   Procedure: Left C1-2 Tumor resection/C5-6 Posterior cervical fusion with  lateral mass fixation;  Surgeon: Ashok Pall, MD;  Location: [place] [facility];  Service: Neurosurgery;  Laterality: Left;  Left C1-2 Tumor resection/C5-6 Posterior cervical fusion with lateral mass fixation   . shoulder sugery Left    rtc repair    Family History  Problem Relation Age of Onset  . Emphysema Father   . Stomach cancer Brother   . Stroke Mother    Social History:  reports that she has been smoking cigarettes.  She has a 50.00 pack-year smoking history. She has never used smokeless tobacco. She reports that she does not drink alcohol or use drugs.  Allergies:  Allergies  Allergen Reactions  . Ace Inhibitors Cough  . Sulfa Antibiotics Nausea Only    Medications:                                                                                                                        I reviewed home medications   ROS:                                                                                                                                     14 systems reviewed and negative except above    Examination:                                                                                                       General: Intubated Psych: Affect appropriate to situation Eyes: No scleral injection HENT: No OP obstrucion Head: Normocephalic.  Cardiovascular: Normal rate and regular rhythm.  Respiratory: Effort normal and breath sounds normal to anterior ascultation GI: Soft.  No distension. There is no tenderness.  Skin: multiple bruises over body  Neurological Examination Mental Status: Intubated, sedated not following commands ( later apparently to nurse ) eyes open Cranial Nerves: Unable to assess visual fields. Pupils 61mm bilateral equal and reactive, face symmetric, gag + Motor: withdraws briskly in all 4 extremities  Tone and bulk:normal tone throughout; no atrophy noted  Sensory: withdraws to noxious stimuli  Deep Tendon Reflexes: 2+ and symmetric throughout Plantars: Right: downgoing   Left: downgoing Cerebellar: unable to asses Gait: unable to assess     Lab Results: Basic Metabolic Panel: Recent Labs  Lab 01/05/2018 1523  NA 135  K 2.9*  CL 93*  CO2 28  GLUCOSE 210*  BUN 20  CREATININE 0.86  CALCIUM 7.9*    CBC: Recent Labs  Lab 12/12/2017 1200  WBC 13.6*  HGB 10.5*  HCT 31.4*  MCV 88.0  PLT 290    Coagulation Studies: No results for input(s): LABPROT, INR in the last 72 hours.  Imaging: Dg Chest Port 1 View  Result Date: 01/05/2018 CLINICAL DATA:  Acute respiratory insufficiency EXAM: PORTABLE CHEST 1 VIEW COMPARISON:  01/02/2018 FINDINGS: Right lower lobe opacity, likely reflecting a small right pleural effusion with associated atelectasis, although pneumonia is not excluded. Left lung is clear. No pneumothorax. The heart is normal in size. Endotracheal tube terminates 5.5 cm above the carina. Enteric tube courses into the stomach. Right arm PICC terminates at the cavoatrial junction. IMPRESSION: Endotracheal tube terminates 5.5 cm above the carina. Additional support apparatus as above. Right lower lobe opacity, likely  reflecting a small right pleural effusion with associated atelectasis, although pneumonia is not excluded. Electronically Signed   By: Julian Hy M.D.   On: 12/30/2017 11:14     ASSESSMENT AND PLAN  77 y/o F with underlying CAD, emphysema, tobacco abuse, gastric mass and recent candidal esophageal infection admitted on 4/17 to [facility] with respiratory distress and possible seizure. Intubated and discovered to have left PCA stroke.   Tx to [facility] 4/24. Noted to be in Afib with RVR. Mri showed small to medium size left occipital lobe infarct.     Left Occiptial lobe infarction Seizures Atrial fibrillation with RVR  Etiology : likely from Afib   #Carotid US #Transthoracic Echo  # Start patient on ASA 325mg  daily, small size of stroke - can start anticoagulation for Afib  #Start or continue Atorvastatin 80 mg/other high intensity statin # BP goal: permissive HTN upto 220/110 mm Hg # HBAIC and Lipid profile # Telemetry monitoring # Frequent neuro checks # NPO until passes stroke swallow screen  Please page stroke NP  Or  PA  Or MD from 8am -4 pm  as this patient from this time will be  followed by the stroke.   You can look them up on [email or web address]  Password [facility]    Sushanth Aroor [facility] Pager Number [phone]

## 2018-01-04 NOTE — Progress Notes (Signed)
Called to bedside regarding AFwRVR.  Rate in 140-160's.  Patient moving in bed, appears uncomfortable / agitated.  Reviewed sedation with RN.  Attempt to adjust sedation.  Will continue to monitor.   Noe Gens, NP-C [facility] Pgr: [phone] or if no answer [phone] 12/18/2017, 2:58 PM

## 2018-01-04 NOTE — H&P (Addendum)
PULMONARY / CRITICAL CARE MEDICINE   Name: Melinda Schroeder MRN: [id number] DOB: 10-Jan-1941    ADMISSION DATE:  12/25/2017 CONSULTATION DATE:  01/10/2018  REFERRING MD:  [facility]   CHIEF COMPLAINT:  Seizure, Concern for CVA  HISTORY OF PRESENT ILLNESS:   77 year old female who presented to [facility] 4/17 via EMS with reports of respiratory distress.  She carries a medical history of CAD, HTN, HLD, GERD, diverticulitis, esophageal dysmotility, prior esophageal dilatation, tobacco abuse, COPD.  On 4/4 she had an upper endoscopy by Dr. 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  Per prior documentation, EMS was called for respiratory distress.  Upon EMS arrival she was in such extremis that she was tripoding.  The patient vomited with concern for aspiration.  She had been having significant reflux in the days prior.  They administered albuterol at which point she reportedly became agonal with questionable seizure activity.  EMS administered Versed for possible seizure.  The family reported that the patient complained of wheezing on 4/16.  She has been seen by her primary care provider on Monday 4/15 with a reported unremarkable visit.  The patient was intubated upon arrival to the emergency room (1 attempt documented, MAC 4 blade).  Multiple attempts at central venous access placement noted including right femoral left IJ and right IJ without success.  She ultimately had a PICC line placed in the right upper extremity.  In addition she underwent lumbar puncture in the setting of fever to 104 with reportedly clear CSF removed.  Chest x-ray post ET tube placement showed hyperinflation per radiology read with no active disease and ET tube 8 cm above the carina.  CT of the head without contrast showed an old right occipital infarct, no acute  intracranial abnormality.  CT angiogram of the chest demonstrated no PE, mild interstitial edema, focal atelectasis in the right lower lobe with question of an infected bleb in the right lower lobe, aortic atherosclerosis and emphysema, left ventricular hypertrophy.  Patient initially required levophed for hypotension.  She was given multiple fluid boluses.  Lactic acid cleared.  Notes reflect that the patient had agitated delirium upon wake up assessments.  She was treated with IV steroids and nebulized bronchodilators.  She had ongoing agitation and altered mental status (not clear what sedation pt was on at the time) and repeat CT imaging of the head was obtained on 4/20 which showed an interval development of acute early subacute small left PCA territory infarct involving the left occipital pole.  No associated hemorrhage or mass-effect.  This was new from the prior study on 4/17.  Chronic right PCA territory infarct stable. 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 Notes from 4/20 raise concern of possible aspiration PNA.  Daughter-in-law reports NGT was displaced and the patient was gurgling on the vent / TF held.  Additionally, she had documented atrial fibrillation treated with cardizem. She was seen by Tele-Neuro.  Decision was  made for transfer to [facility] for further evaluation 4/24.    [place] labs 4/24 - WBC 6.3, Hgb 9.7, platelets 209, Na 132, K 2.8, Cl 89, CO2 34, AG 12, BUN 26 (rest of labs not in papers sent).  ABG 7.56 / 40 / 97 / 35.  PCCM  accepting for admit.  Neurology consulting.   PAST MEDICAL HISTORY :  She  has a past medical history of Anginal pain ([place]), Anxiety, Bronchitis, allergic, COPD (chronic obstructive pulmonary disease) ([place]), Coronary artery disease, DDD (degenerative disc disease), Diverticulitis, Esophageal dysmotilities, Gastritis, GERD (gastroesophageal reflux disease), Hypercholesterolemia, Hypertension, IBS (irritable bowel syndrome), Restless legs, Sepsis ([place]) (02/2016), and Shortness of breath dyspnea.  PAST SURGICAL HISTORY: She  has a past surgical history that includes Bladder surgery; breast mass right excision; Hernia repair; Coronary stent placement; Appendectomy; shoulder sugery (Left); Anterior cervical decomp/discectomy fusion (N/A, 06/08/2013); Abdominal hysterectomy; EUS (N/A, 10/17/2014); EUS (N/A, 12/04/2015); Laminectomy (Left, 02/11/2016); and EUS (N/A, 12/15/2017).  Allergies  Allergen Reactions  . Ace Inhibitors Cough  . Sulfa Antibiotics Nausea Only    No current facility-administered medications on file prior to encounter.    Current Outpatient Medications on File Prior to Encounter  Medication Sig  . albuterol (PROVENTIL HFA;VENTOLIN HFA) 108 (90 Base) MCG/ACT inhaler Inhale 2 puffs into the lungs every 6 (six) hours as needed for wheezing or shortness of breath.  . ALPRAZolam (XANAX) 0.25 MG tablet Take 0.25 mg by mouth 3 (three) times daily as needed for anxiety.  [name] aspirin 81 MG chewable tablet Chew 1 tablet (81 mg total) by mouth daily.  . cyanocobalamin (,VITAMIN B-12,) 1000 MCG/ML injection Inject 1,000 mcg into the muscle every 30 (thirty) days.  [name] HYDROcodone-acetaminophen (NORCO) 10-325 MG tablet Take 1 tablet by mouth 3 (three) times daily as needed for severe pain.  [name] losartan (COZAAR) 100 MG tablet Take 100 mg by mouth daily.  . magnesium oxide (MAG-OX) 400 MG tablet Take 400 mg by mouth daily.  . metoprolol succinate (TOPROL-XL) 100 MG 24 hr tablet Take 100 mg by mouth 2  (two) times daily. Take with or immediately following a meal.  . mirtazapine (REMERON) 15 MG tablet Take 15 mg by mouth at bedtime.  . nitroGLYCERIN (NITROSTAT) 0.4 MG SL tablet Place 0.4 mg under the tongue every 5 (five) minutes as needed for chest pain.  . Nutritional Supplements (ESTROVEN PO) Take 1 tablet by mouth at bedtime.  [name] omeprazole (PRILOSEC) 20 MG capsule Take 20 mg by mouth 2 (two) times daily before a meal.   . PARoxetine (PAXIL) 20 MG tablet Take 20 mg by mouth every morning.   . potassium chloride (K-DUR,KLOR-CON) 10 MEQ tablet Take 10 mEq by mouth 2 (two) times daily.  . promethazine (PHENERGAN) 25 MG tablet Take 25 mg by mouth every 4 (four) hours as needed for nausea or vomiting.  . simvastatin (ZOCOR) 40 MG tablet Take 40 mg by mouth at bedtime.     FAMILY HISTORY:  Her indicated that the status of her mother is unknown. She indicated that the status of her father is unknown. She indicated that the status of her brother is unknown.   SOCIAL HISTORY: She  reports that she has been smoking cigarettes.  She has a 50.00 pack-year smoking history. She has never used smokeless tobacco. She reports that she does not drink alcohol or use drugs.  REVIEW OF SYSTEMS:   Unable to complete as patient is sedate on mechanical ventilation.   SUBJECTIVE:  VITAL SIGNS: Pulse (!) 56   Resp (!) 22   Ht 5' 1"  (1.549 m)   Wt 149 lb 14.6 oz (68 kg)   SpO2 100%   BMI 28.33 kg/m   HEMODYNAMICS:    VENTILATOR SETTINGS: Vent Mode: PRVC FiO2 (%):  [30 %] 30 % Set Rate:  [22 bmp] 22 bmp Vt Set:  [450 mL] 450 mL PEEP:  [5 cmH20] 5 cmH20 Plateau Pressure:  [18 cmH20] 18 cmH20  INTAKE / OUTPUT: No intake/output data recorded.  PHYSICAL EXAMINATION: General:  Frail elderly lady, critically ill appearing on vent Neuro:  Pupils 2-44m, withdraws from pain, sedation turned off  HEENT:  MM pink/moist, ETT, bruising to neck,chest Cardiovascular:  s1s2 regular, SR in 90's on  monitor, distant tones  Lungs:  Even/non-labored, lungs bilaterally diminished, no wheezing  Abdomen:  Obese/soft, bsx4 active  Musculoskeletal:  No acute deformities  Skin:  Fragile, thin skin.  Multiple areas of bruising, right groin bruising / soft.    LABS:  BMET No results for input(s): NA, K, CL, CO2, BUN, CREATININE, GLUCOSE in the last 168 hours.  Electrolytes No results for input(s): CALCIUM, MG, PHOS in the last 168 hours.  CBC No results for input(s): WBC, HGB, HCT, PLT in the last 168 hours.  Coag's No results for input(s): APTT, INR in the last 168 hours.  Sepsis Markers No results for input(s): LATICACIDVEN, PROCALCITON, O2SATVEN in the last 168 hours.  ABG No results for input(s): PHART, PCO2ART, PO2ART in the last 168 hours.  Liver Enzymes No results for input(s): AST, ALT, ALKPHOS, BILITOT, ALBUMIN in the last 168 hours.  Cardiac Enzymes No results for input(s): TROPONINI, PROBNP in the last 168 hours.  Glucose No results for input(s): GLUCAP in the last 168 hours.  Imaging No results found.   STUDIES:  CT Head w/o 4/17 >> old right occipital infarct, no acute intracranial abnormality.   CTA Chest 4/17 >> no PE, mild interstitial edema, focal atelectasis in the right lower lobe with question of an infected bleb in the right lower lobe, aortic atherosclerosis and emphysema, left ventricular hypertrophy.   CT Head 4/20 >> interval development of acute early subacute small left PCA territory infarct involving the left occipital pole.  No associated hemorrhage or mass-effect.  This was new from the prior study on 4/17.  Chronic right PCA territory infarct, stable. CT Abd/Pelvis 4/20 >> trace bilateral pleural effusions, partial consolidation of the right lower lung, nonobstructing right renal stones measuring up to 5 mm, diffuse to diverticulosis along the sigmoid colon without evidence of diverticulitis, aneurysmal dilatation of the infrarenal abdominal aorta  to 3.3 cm new from 2013, trace ascites noted about the liver, mild soft tissue edema.   C[facility]  [facility] 4/17 >> rare WBC, no organisms, negative culture HSV CSF 4/17 >> negative  BCx2 4/17 >> negative  UC 4/17 >> negative  Flu 4/17 >> negative  MRSA PCR 4/17 >> positive  Tracheal Aspirate 4/22 >> normal flora    ........................ BCx2 4/24 >>  Sputum 4/24 >>  UC 4/24 >>   ANTIBIOTICS: Vanco 4/17 >> 4/24 Zosyn 4/17 >> 4/24  SIGNIFICANT EVENTS: 4/17  [facility]TUB[facility]: ETT 4/17 >>  RUE PICC ([facility]) 4/? >>   DISCUSSION: 77y/o F with underlying CAD, emphysema, tobacco abuse, gastric mass and recent candidal esophageal infection [facility] and possible [facility]  Intubated.  Course complicated by AKI, AF, hypo/hypertension and difficulty weaning.  Tx to [facility] 4/24.   ASSESSMENT / PLAN:  PULMONARY A: Acute Hypoxic Respiratory Failure  RLL Aspiration PNA Tobacco Abuse - 60+ years, heaviest 1.5 ppd, currently 0.5 ppd COPD / Emphysema - noted on CT  P:   PRVC 8 cc/kg  Wean PEEP / FiO2 for sats 88-94% ABG reviewed with rate adjustment to 16 Now CXR and in am  ABG in am  Daily SBT / WUA  Chest percussion via bed  Guaifenesin 300 mg BID  CARDIOVASCULAR A:  AF with RVR - currently ST, reportedly sensitive to albuterol  Prolonged QT - noted on EKG on admit, 499 CAD s/p Stent - noted on CT imaging  Aortic Aneurysm - noted on CT imaging  P:  ICU monitoring  Assess EKG  Avoid QT altering medications as able  Resume home lopressor at reduced dose Hold home cozaar  PRN hydralazine for SBP > 180 Continue home zocor Nicotine patch - currently smokes 1/2ppd  RENAL A:   AKI - in setting of contrast, diuretics, hypo/hypertension P:   Trend BMP / urinary output Now labs pending  Replace electrolytes as indicated Avoid nephrotoxic agents,  ensure adequate renal perfusion  GASTROINTESTINAL A:   At Risk Malnutrition  Hx GERD, Diverticulosis, Gastric Mass (EGD 4/4), Candidal Esophageal Infection  P:   NPO / OGT Begin TF  Pepcid for SUP   HEMATOLOGIC A:   Anemia - suspect element of chronic disease, phlebotomy  P:  Trend CBC  SCD's for DVT prophylaxis until head imaging completed   INFECTIOUS A:   RLL Aspiration PNA  P:   Reassess cultures on admit  D8 abx, hold further for now  Assess PCT  Monitor fever curve / WBC trend  Last doses of abx > confirmed with pharmacy: vanco 1gm Q24 on 4/23 1541, zosyn 4/24 0515 4.5gm over 30 minutes   ENDOCRINE A:   Hyperglycemia    P:   SSI, sensitive scale   NEUROLOGIC A: Concern for L PCA Infarct - small on CT, MRI pending Hx R PCA CVA   Hx RLS, Anxiety, DDD Home Benzo Use  P:   RASS goal: 0 to -1  MRI pending Propofol for sedation  PRN fentanyl for pain  Continue home paxil, remeron   FAMILY  - Updates:  Husband, Son, Daughter-in-law, brother updated at bedside in full.  Daughter-in-law is an Therapist, sports and works in [employer], site.  Family asking about code status. They want to continue aggressive support if opportunity for recovery.  However, they would not want extraordinary measures if she suffered a cardiac arrest.  No trach or facility.  LCB.   - Inter-disciplinary family meet or Palliative Care meeting due by:  Completed, see above.   CC Time: 35 minutes.   Noe Gens, NP-C [place] [facility] Pgr: [phone] or if no answer [phone] 12/25/2017, 10:05 AM

## 2018-01-05 ENCOUNTER — Inpatient Hospital Stay (HOSPITAL_COMMUNITY): Payer: Medicare HMO

## 2018-01-05 ENCOUNTER — Other Ambulatory Visit (HOSPITAL_COMMUNITY): Payer: Self-pay

## 2018-01-05 DIAGNOSIS — M7989 Other specified soft tissue disorders: Secondary | ICD-10-CM

## 2018-01-05 DIAGNOSIS — J441 Chronic obstructive pulmonary disease with (acute) exacerbation: Secondary | ICD-10-CM

## 2018-01-05 DIAGNOSIS — J181 Lobar pneumonia, unspecified organism: Secondary | ICD-10-CM

## 2018-01-05 DIAGNOSIS — I6340 Cerebral infarction due to embolism of unspecified cerebral artery: Secondary | ICD-10-CM

## 2018-01-05 LAB — BASIC METABOLIC PANEL
Anion gap: 12 (ref 5–15)
BUN: 27 mg/dL — AB (ref 6–20)
CHLORIDE: 94 mmol/L — AB (ref 101–111)
CO2: 30 mmol/L (ref 22–32)
CREATININE: 0.83 mg/dL (ref 0.44–1.00)
Calcium: 8 mg/dL — ABNORMAL LOW (ref 8.9–10.3)
GFR calc Af Amer: >60 mL/min (ref >60)
GFR calc non Af Amer: >60 mL/min (ref >60)
GLUCOSE: 189 mg/dL — AB (ref 65–99)
POTASSIUM: 3.3 mmol/L — AB (ref 3.5–5.1)
Sodium: 136 mmol/L (ref 135–145)

## 2018-01-05 LAB — PHOSPHORUS: PHOSPHORUS: 2.7 mg/dL (ref 2.5–4.6)

## 2018-01-05 LAB — POCT I-STAT, CHEM 8
BUN: 16 mg/dL (ref 6–20)
CALCIUM ION: 0.81 mmol/L — AB (ref 1.15–1.40)
CREATININE: 0.4 mg/dL — AB (ref 0.44–1.00)
Chloride: 107 mmol/L (ref 101–111)
GLUCOSE: 120 mg/dL — AB (ref 65–99)
HCT: 19 % — ABNORMAL LOW (ref 36.0–46.0)
Hemoglobin: 6.5 g/dL — CL (ref 12.0–15.0)
Potassium: 2.8 mmol/L — ABNORMAL LOW (ref 3.5–5.1)
Sodium: 140 mmol/L (ref 135–145)
TCO2: 21 mmol/L — ABNORMAL LOW (ref 22–32)

## 2018-01-05 LAB — GLUCOSE, CAPILLARY
GLUCOSE-CAPILLARY: 152 mg/dL — AB (ref 65–99)
Glucose-Capillary: 125 mg/dL — ABNORMAL HIGH (ref 65–99)
Glucose-Capillary: 135 mg/dL — ABNORMAL HIGH (ref 65–99)
Glucose-Capillary: 150 mg/dL — ABNORMAL HIGH (ref 65–99)
Glucose-Capillary: 178 mg/dL — ABNORMAL HIGH (ref 65–99)
Glucose-Capillary: 195 mg/dL — ABNORMAL HIGH (ref 65–99)

## 2018-01-05 LAB — ECHOCARDIOGRAM COMPLETE
HEIGHTINCHES: 61 in
Weight: 2398.6 oz

## 2018-01-05 LAB — BLOOD GAS, ARTERIAL
Acid-Base Excess: 8 mmol/L — ABNORMAL HIGH (ref 0.0–2.0)
Bicarbonate: 32.6 mmol/L — ABNORMAL HIGH (ref 20.0–28.0)
DRAWN BY: 414221
FIO2: 30
O2 SAT: 97.6 %
PATIENT TEMPERATURE: 98.4
PEEP: 5 cmH2O
PH ART: 7.423 (ref 7.350–7.450)
PO2 ART: 104 mmHg (ref 83.0–108.0)
RATE: 16 resp/min
VT: 450 mL
pCO2 arterial: 50.8 mmHg — ABNORMAL HIGH (ref 32.0–48.0)

## 2018-01-05 LAB — LIPID PANEL
CHOL/HDL RATIO: 4.8 ratio
Cholesterol: 200 mg/dL (ref 0–200)
HDL: 42 mg/dL (ref >40)
LDL Cholesterol: 122 mg/dL — ABNORMAL HIGH (ref 0–99)
Triglycerides: 181 mg/dL — ABNORMAL HIGH (ref <150)
VLDL: 36 mg/dL (ref 0–40)

## 2018-01-05 LAB — CBC
HEMATOCRIT: 29.6 % — AB (ref 36.0–46.0)
HEMOGLOBIN: 9.5 g/dL — AB (ref 12.0–15.0)
MCH: 29.1 pg (ref 26.0–34.0)
MCHC: 32.1 g/dL (ref 30.0–36.0)
MCV: 90.5 fL (ref 78.0–100.0)
Platelets: 296 10*3/uL (ref 150–400)
RBC: 3.27 MIL/uL — AB (ref 3.87–5.11)
RDW: 15.1 % (ref 11.5–15.5)
WBC: 18.5 10*3/uL — ABNORMAL HIGH (ref 4.0–10.5)

## 2018-01-05 LAB — MAGNESIUM: Magnesium: 1.8 mg/dL (ref 1.7–2.4)

## 2018-01-05 LAB — URINE CULTURE: Culture: NO GROWTH

## 2018-01-05 LAB — APTT: aPTT: 24 seconds (ref 24–36)

## 2018-01-05 LAB — TSH: TSH: 1.454 u[IU]/mL (ref 0.350–4.500)

## 2018-01-05 LAB — PROTIME-INR
INR: 1.17
PROTHROMBIN TIME: 14.8 s (ref 11.4–15.2)

## 2018-01-05 MED ORDER — METOPROLOL TARTRATE 5 MG/5ML IV SOLN
5.0000 mg | Freq: Once | INTRAVENOUS | Status: AC
  Administered 2018-01-05: 5 mg via INTRAVENOUS

## 2018-01-05 MED ORDER — IOPAMIDOL (ISOVUE-370) INJECTION 76%
INTRAVENOUS | Status: AC
  Administered 2018-01-05: 12:00:00
  Filled 2018-01-05: qty 50

## 2018-01-05 MED ORDER — POTASSIUM CHLORIDE CRYS ER 20 MEQ PO TBCR
40.0000 meq | EXTENDED_RELEASE_TABLET | Freq: Once | ORAL | Status: AC
  Administered 2018-01-05: 40 meq via ORAL
  Filled 2018-01-05: qty 2

## 2018-01-05 MED ORDER — SODIUM CHLORIDE 0.9 % IV BOLUS
500.0000 mL | Freq: Once | INTRAVENOUS | Status: AC
  Administered 2018-01-05: 500 mL via INTRAVENOUS

## 2018-01-05 MED ORDER — IOPAMIDOL (ISOVUE-370) INJECTION 76%
INTRAVENOUS | Status: AC
  Administered 2018-01-05: 12:00:00
  Filled 2018-01-05: qty 100

## 2018-01-05 MED ORDER — POTASSIUM CHLORIDE 10 MEQ/50ML IV SOLN
INTRAVENOUS | Status: AC
  Filled 2018-01-05: qty 250

## 2018-01-05 MED ORDER — HEPARIN SODIUM (PORCINE) 5000 UNIT/ML IJ SOLN
5000.0000 [IU] | Freq: Three times a day (TID) | INTRAMUSCULAR | Status: DC
  Administered 2018-01-05 – 2018-01-11 (×18): 5000 [IU] via SUBCUTANEOUS
  Filled 2018-01-05 (×18): qty 1

## 2018-01-05 MED ORDER — IOPAMIDOL (ISOVUE-370) INJECTION 76%
50.0000 mL | Freq: Once | INTRAVENOUS | Status: AC
  Administered 2018-01-05: 50 mL via INTRAVENOUS

## 2018-01-05 MED ORDER — POTASSIUM CHLORIDE 10 MEQ/50ML IV SOLN
10.0000 meq | INTRAVENOUS | Status: AC
  Administered 2018-01-05 (×5): 10 meq via INTRAVENOUS

## 2018-01-05 MED ORDER — DILTIAZEM HCL 100 MG IV SOLR
5.0000 mg/h | INTRAVENOUS | Status: DC
  Administered 2018-01-05: 5 mg/h via INTRAVENOUS
  Administered 2018-01-05: 12.5 mg/h via INTRAVENOUS
  Administered 2018-01-06: 5 mg/h via INTRAVENOUS
  Administered 2018-01-06 (×2): 10 mg/h via INTRAVENOUS
  Administered 2018-01-07 – 2018-01-09 (×9): 15 mg/h via INTRAVENOUS
  Filled 2018-01-05 (×13): qty 100

## 2018-01-05 MED ORDER — ASPIRIN 300 MG RE SUPP
300.0000 mg | Freq: Every day | RECTAL | Status: DC
  Administered 2018-01-05: 300 mg via RECTAL
  Filled 2018-01-05: qty 1

## 2018-01-05 MED FILL — Midazolam HCl Inj 5 MG/5ML (Base Equivalent): INTRAMUSCULAR | Qty: 5 | Status: AC

## 2018-01-05 NOTE — Procedures (Signed)
Arterial line placement  Location: Extremely labile blood pressures requiring intervention  Seizure: After obtaining informed consent and performing a timeout the area over the left groin was sterilely prepped with chlorhexidine the area was then draped and the left femoral artery cannulated by palpation.  A wire was gently passed and a 20-gauge arterial catheter placed over the wire after sharply incising the skin.  The catheter was sutured in place.  There was good tracing and flow, there was no immediate complication.

## 2018-01-05 NOTE — Progress Notes (Addendum)
Preliminary results by tech - Lower Ext. Venous Duplex Completed. Technically challenged study due to patient's body habitus and edema. No evidence of obvious deep vein thrombosis in both legs. 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 In addition, two small hematoma in the proximal right thigh. Results given to Lattie Haw, patient's nurse. Oda Cogan, BS, RDMS, RVT

## 2018-01-05 NOTE — Progress Notes (Signed)
E-link MD made aware of patients heart rate, blood pressure, and brief asystole. No new orders received, will continue to monitor.

## 2018-01-05 NOTE — Progress Notes (Signed)
PULMONARY / CRITICAL CARE MEDICINE   Name: Melinda Schroeder MRN: [id number] DOB: 1941-02-06    ADMISSION DATE:  12/12/2017  CHIEF COMPLAINT:  Respiratory distress, CVA, question of seizure  HISTORY OF PRESENT ILLNESS:   77 year old female who presented to [facility] 4/17 via EMS with reports of respiratory distress.  She carries a medical history of CAD, HTN, HLD, GERD, diverticulitis, esophageal dysmotility, prior esophageal dilatation, tobacco abuse, COPD.  On 4/4 she had an upper endoscopy by Dr. 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  Per prior documentation, EMS was called for respiratory distress.  Upon EMS arrival she was in such extremis that she was tripoding.  The patient vomited with concern for aspiration.  She had been having significant reflux in the days prior.  They administered albuterol at which point she reportedly became agonal with questionable seizure activity.  EMS administered Versed for possible seizure.  The family reported that the patient complained of wheezing on 4/16.  She has been seen by her primary care provider on Monday 4/15 with a reported unremarkable visit.  The patient was intubated upon arrival to the emergency room (1 attempt documented, MAC 4 blade).  Multiple attempts at central venous access placement noted including right femoral left IJ and right IJ without success.  She ultimately had a PICC line placed in the right upper extremity.  In addition she underwent lumbar puncture in the setting of fever to 104 with reportedly clear CSF removed.  Chest x-ray post ET tube placement showed hyperinflation per radiology read with no active disease and ET tube 8 cm above the carina.  CT of the head without contrast showed an old right occipital infarct, no acute intracranial abnormality.  CT angiogram of the  chest demonstrated no PE, mild interstitial edema, focal atelectasis in the right lower lobe with question of an infected bleb in the right lower lobe, aortic atherosclerosis and emphysema, left ventricular hypertrophy.  Patient initially required levophed for hypotension.  She was given multiple fluid boluses.  Lactic acid cleared.  Notes reflect that the patient had agitated delirium upon wake up assessments.  She was treated with IV steroids and nebulized bronchodilators.  She had ongoing agitation and altered mental status (not clear what sedation pt was on at the time) and repeat CT imaging of the head was obtained on 4/20 which showed an interval development of acute early subacute small left PCA territory infarct involving the left occipital pole.  No associated hemorrhage or mass-effect.  This was new from the prior study on 4/17.  Chronic right PCA territory infarct stable. 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 Notes from 4/20 raise concern of possible aspiration PNA.  Daughter-in-law reports NGT was displaced and the patient was gurgling on the vent / TF held.  Additionally, she had documented atrial fibrillation treated with cardizem. She was seen by Tele-Neuro.  Decision was made for transfer to [facility] for further evaluation 4/24.  She is deeply sedated on a combination of propofol and fentanyl this morning and is not at all interactive.  She has been intermittently in atrial fibrillation.  I asked her husband whether there are risks involved  with type coagulation, specifically whether her GI problems were diagnosed after a GI bleed.  He is not aware that she has had any blood loss from her GI tract.  He does report that she seems to neck and bleed easily and that when they withdrew a line from her on at the previous hospital she had prolonged blood loss.  MRI today has shown a new acute left occipital infarct.   PAST MEDICAL HISTORY :  She  has a past medical history of Anginal pain ([place]), Anxiety, Bronchitis, allergic, COPD (chronic obstructive pulmonary disease) ([place]), Coronary artery disease, DDD (degenerative disc disease), Diverticulitis, Esophageal dysmotilities, Gastritis, GERD (gastroesophageal reflux disease), Hypercholesterolemia, Hypertension, IBS (irritable bowel syndrome), Restless legs, Sepsis ([place]) (02/2016), and Shortness of breath dyspnea.  PAST SURGICAL HISTORY: She  has a past surgical history that includes Bladder surgery; breast mass right excision; Hernia repair; Coronary stent placement; Appendectomy; shoulder sugery (Left); Anterior cervical decomp/discectomy fusion (N/A, 06/08/2013); Abdominal hysterectomy; EUS (N/A, 10/17/2014); EUS (N/A, 12/04/2015); Laminectomy (Left, 02/11/2016); and EUS (N/A, 12/15/2017).  Allergies  Allergen Reactions  . Ace Inhibitors Cough  . Sulfa Antibiotics Nausea Only    No current facility-administered medications on file prior to encounter.    Current Outpatient Medications on File Prior to Encounter  Medication Sig  . albuterol (PROVENTIL HFA;VENTOLIN HFA) 108 (90 Base) MCG/ACT inhaler Inhale 2 puffs into the lungs every 6 (six) hours as needed for wheezing or shortness of breath.  . ALPRAZolam (XANAX) 0.25 MG tablet Take 0.25 mg by mouth 3 (three) times daily as needed for anxiety.  [name] aspirin 81 MG chewable tablet Chew 1 tablet (81 mg total) by mouth daily.  . cyanocobalamin (,VITAMIN B-12,) 1000 MCG/ML injection Inject 1,000 mcg into the muscle every 30 (thirty) days.  [name]  HYDROcodone-acetaminophen (NORCO) 10-325 MG tablet Take 1 tablet by mouth 3 (three) times daily as needed for severe pain.  [name] losartan (COZAAR) 100 MG tablet Take 100 mg by mouth daily.  . magnesium oxide (MAG-OX) 400 MG tablet Take 400 mg by mouth daily.  . metoprolol succinate (TOPROL-XL) 100 MG 24 hr tablet Take 100 mg by mouth 2 (two) times daily. Take with or immediately following a meal.  . mirtazapine (REMERON) 15 MG tablet Take 15 mg by mouth at bedtime.  . nitroGLYCERIN (NITROSTAT) 0.4 MG SL tablet Place 0.4 mg under the tongue every 5 (five) minutes as needed for chest pain.  . Nutritional Supplements (ESTROVEN PO) Take 1 tablet by mouth at bedtime.  [name] omeprazole (PRILOSEC) 20 MG capsule Take 20 mg by mouth 2 (two) times daily before a meal.   . PARoxetine (PAXIL) 20 MG tablet Take 20 mg by mouth every morning.   . piperacillin-tazobactam (ZOSYN) IVPB Inject 4.5 g into the vein.  . potassium chloride (K-DUR,KLOR-CON) 10 MEQ tablet Take 10 mEq by mouth 2 (two) times daily.  . promethazine (PHENERGAN) 25 MG tablet Take 25 mg by mouth every 4 (four) hours as needed for nausea or vomiting.  . simvastatin (ZOCOR) 40 MG tablet Take 40 mg by mouth at bedtime.   . vancomycin IVPB Inject 1,000 mg into the vein daily.    FAMILY HISTORY:  Her indicated that the status of her mother is unknown. She indicated that the status of  her father is unknown. She indicated that the status of her brother is unknown.   SOCIAL HISTORY: She  reports that she has been smoking cigarettes.  She has a 50.00 pack-year smoking history. She has never used smokeless tobacco. She reports that she does not drink alcohol or use drugs.  REVIEW OF SYSTEMS:   Unobtainable  SUBJECTIVE:  Unobtainable  VITAL SIGNS: BP (!) 168/67   Pulse (!) 110   Temp 97.6 F (36.4 C) (Axillary)   Resp 11   Ht _0  (1.549 m)   Wt 149 lb 14.6 oz (68 kg)   SpO2 99%   BMI 28.33 kg/m   HEMODYNAMICS:    VENTILATOR  SETTINGS: Vent Mode: PRVC FiO2 (%):  [30 %] 30 % Set Rate:  [16 bmp] 16 bmp Vt Set:  [450 mL] 450 mL PEEP:  [5 cmH20] 5 cmH20 Pressure Support:  [5 cmH20] 5 cmH20 Plateau Pressure:  [15 cmH20-27 cmH20] 24 cmH20  INTAKE / OUTPUT: I/O last 3 completed shifts: In: 2289.8 [I.V.:1326.5; NG/GT:863.3; IV Piggyback:100] Out: 1260 [Urine:1260]  PHYSICAL EXAMINATION: General: Elderly ill-appearing female who is orally intubated mechanically ventilated sedated and in no acute distress Neuro: Pupils equal and reactive, no response to voice or sternal rub Cardiovascular: S1 and S2 are currently regular without murmur rub or gallop Lungs: Respirations are unlabored, there is decreased air movement at the right base, she is wheezing more prominently on the right. Abdomen: Abdomen is obese and soft without any organomegaly masses or tenderness. Skin: It is extraordinarily friable with bleeding after performing a sternal rub.  She has diffuse scattered ecchymoses.  LABS:  BMET Recent Labs  Lab 12/20/2017 1523 01/05/18 0412  NA 135 136  K 2.9* 3.3*  CL 93* 94*  CO2 28 30  BUN 20 27*  CREATININE 0.86 0.83  GLUCOSE 210* 189*    Electrolytes Recent Labs  Lab 12/26/2017 1523 01/05/18 0412  CALCIUM 7.9* 8.0*  MG  --  1.8  PHOS  --  2.7    CBC Recent Labs  Lab 01/08/2018 1200 01/05/18 0412  WBC 13.6* 18.5*  HGB 10.5* 9.5*  HCT 31.4* 29.6*  PLT 290 296    Coag's No results for input(s): APTT, INR in the last 168 hours.  Sepsis Markers Recent Labs  Lab 12/30/2017 1523  PROCALCITON 0.33    ABG Recent Labs  Lab 01/02/2018 1210 01/05/18 0330  PHART 7.504* 7.423  PCO2ART 43.2 50.8*  PO2ART 92.0 104    Liver Enzymes Recent Labs  Lab 12/14/2017 1523  AST 17  ALT 22  ALKPHOS 45  BILITOT 0.9  ALBUMIN 2.9*    Cardiac Enzymes No results for input(s): TROPONINI, PROBNP in the last 168 hours.  Glucose Recent Labs  Lab 12/13/2017 1359 12/28/2017 1542 12/31/2017 1947  12/22/2017 2337 01/05/18 0357  GLUCAP 212* 196* 150* 184* 178*    Imaging Mr Brain Wo Contrast  Result Date: 12/22/2017 CLINICAL DATA:  77 y/o F; respiratory failure and sedation, possible left occipital infarction. EXAM: MRI HEAD WITHOUT CONTRAST TECHNIQUE: Multiplanar, multiecho pulse sequences of the brain and surrounding structures were obtained without intravenous contrast. COMPARISON:  12/31/2017 CT head.  02/25/2016 MRI of the head. FINDINGS: Brain: 15 mm focus of reduced diffusion in the left occipital lobe corresponding to lesion on CT compatible with acute/early subacute infarction. No associated hemorrhage or mass effect. No additional evidence for acute infarction. No abnormal susceptibility hypointensity to indicate intracranial hemorrhage. No extra-axial collection, hydrocephalus, focal mass effect, or effacement  of basilar cisterns. Mild chronic microvascular ischemic changes and parenchymal volume loss of the brain for age. Chronic right occipital lobe infarction. Vascular: Normal flow voids. Skull and upper cervical spine: Normal marrow signal. Sinuses/Orbits: Small mastoid effusions and mucosal thickening of the paranasal sinuses, probably due to intubation. Other: None. IMPRESSION: 1. 15 mm acute/early subacute infarction within the left occipital lobe corresponding to abnormality on CT. No associated hemorrhage or mass effect. 2. Stable mild chronic microvascular ischemic changes and parenchymal volume loss of the brain for age. Stable chronic infarction in right occipital lobe. These results will be called to the ordering clinician or representative by the Radiologist Assistant, and communication documented in the PACS or zVision Dashboard. Electronically Signed   By: Kristine Garbe M.D.   On: 12/28/2017 19:03   Dg Chest Port 1 View  Result Date: 01/05/2018 CLINICAL DATA:  Respiratory insufficiency.  CVA. EXAM: PORTABLE CHEST 1 VIEW COMPARISON:  12/12/2017. FINDINGS:  Endotracheal tube and NG tube in stable position. Right IJ line in stable position, tip in the right atrium. Heart size normal. Mild right base and effusion again noted. No interim change. No pneumothorax. Prior cervical spine fusion. IMPRESSION: 1. Lines and tubes in stable position. Right PICC line in unchanged position with tip in right atrium. 2. Right base infiltrate and small right pleural effusion again noted without interim change. Electronically Signed   By: Marcello Moores  Register   On: 01/05/2018 09:51     ANTIBIOTICS: Zosyn started 4/24 for presumed aspiration.  LINES/TUBES:        DISCUSSION: ASSESSMENT / PLAN:  PULMONARY A: This is a 77 year old who presented with respiratory extremis.  She has a history of COPD and on exam today has a very asymmetric examination.  I suspect that she has significant pneumonia and will be obtaining a CT scan of the chest today as report from the outside hospital suggested that she had a bleb with an air-fluid level.  Currently she is only being covered for Zosyn her MRSA screen is positive and I would have a very very low threshold for the addition of an agent for resistant gram positives.    CARDIOVASCULAR A: He is having intermittent atrial fibrillation in the setting of a prior stroke and a new stroke I would normally have a very low threshold for initiation of full dose anticoagulation however she appears to be bruising and bleeding very easily and I like to get some baseline coags before initiating full dose heparin.  RENAL A: Creatinine this morning is normal.   GASTROINTESTINAL A: Prophylaxis is with Pepcid.  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   HEMATOLOGIC A: Phylactic dose heparin is in place  INFECTIOUS A: Pneumonia and possible lung abscess.  Current coverage is with Zosyn alone pending culture results should she  decline I would empirically add coverage for resistant gram positives  ENDOCRINE A: Sliding scale insulin is in place  NEUROLOGIC A: Distant history of right occipital infarct with new subacute left occipital infarct Beginning statin for secondary prevention, full dose heparin for atrial fibrillation pending establishment of a normal baseline coagulation state.  Greater than 32 minutes was spent in the care of this acutely ill patient with life-threatening illness.   Lars Masson, MD Critical Care Medicine [facility] Pager: [phone]  01/05/2018, 10:57 AM

## 2018-01-05 NOTE — Progress Notes (Deleted)
[facility] ADULT ICU REPLACEMENT PROTOCOL FOR AM LAB REPLACEMENT ONLY  The patient does apply for the [facility] Adult ICU Electrolyte Replacment Protocol based on the criteria listed below:   1. Is GFR >/= 40 ml/min? Yes.    Patient's GFR today is >60 2. Is urine output >/= 0.5 ml/kg/hr for the last 6 hours? Yes.   Patient's UOP is 0.9 ml/kg/hr 3. Is BUN < 60 mg/dL? Yes.     Patient's BUN today is 27 4. Abnormal electrolyte K 3.3 5. Ordered repletion with: per protocol 6. If a panic level lab has been reported, has the CCM MD in charge been notified? Yes.  .   Physician:  Jolayne Panther 01/05/2018 5:36 AM

## 2018-01-05 NOTE — Progress Notes (Deleted)
Preliminary notes--Bilateral lower extremities venous duplex study could not completed due to patient has severe pain. Veins not well visualized due to severe edema.  Patient has severe bruise on bilateral lower extremities from groin area throughout feet.  Bandages cover right groin and calf region as well.    Hongying Lavarius Doughten (RDMS RVT) 01/05/18 1:03 PM

## 2018-01-05 NOTE — Progress Notes (Addendum)
STROKE TEAM PROGRESS NOTE   SUBJECTIVE (INTERVAL HISTORY) Her husband is at the bedside.  He stated that yesterday pt had sudden onset SOB, AMS, on and off unresponsiveness. EMS called, in ER pt was intubated. As per Dr. Dominica Severin, pt had CTA chest at OSH and PE was ruled out. MRI showed old left PCA infarct with new acute left distal MCA, MCA/PCA small infarct.   Currently pt intubated on sedation, not following commands. CTA head and neck pending. Had Afib RVR last night. This morning not in afib on tele during rounds. HR normal.   OBJECTIVE Temp:  [97.6 F (36.4 C)-98.6 F (37 C)] 97.6 F (36.4 C) (04/25 0400) Pulse Rate:  [56-132] 87 (04/25 0700) Cardiac Rhythm: Normal sinus rhythm (04/25 0400) Resp:  [12-31] 16 (04/25 0700) BP: (94-209)/(46-100) 139/63 (04/25 0700) SpO2:  [94 %-100 %] 100 % (04/25 0700) FiO2 (%):  [30 %] 30 % (04/25 0700) Weight:  [149 lb 14.6 oz (68 kg)] 149 lb 14.6 oz (68 kg) (04/24 0945)  CBC:  Recent Labs  Lab 12/22/2017 1200 01/05/18 0412  WBC 13.6* 18.5*  HGB 10.5* 9.5*  HCT 31.4* 29.6*  MCV 88.0 90.5  PLT 290 132    Basic Metabolic Panel:  Recent Labs  Lab 12/12/2017 1523 01/05/18 0412  NA 135 136  K 2.9* 3.3*  CL 93* 94*  CO2 28 30  GLUCOSE 210* 189*  BUN 20 27*  CREATININE 0.86 0.83  CALCIUM 7.9* 8.0*  MG  --  1.8  PHOS  --  2.7    Lipid Panel:     Component Value Date/Time   CHOL 200 01/05/2018 0412   TRIG 181 (H) 01/05/2018 0412   HDL 42 01/05/2018 0412   CHOLHDL 4.8 01/05/2018 0412   VLDL 36 01/05/2018 0412   LDLCALC 122 (H) 01/05/2018 0412   HgbA1c:  Lab Results  Component Value Date   HGBA1C 5.7 (H) 02/22/2016   Urine Drug Screen: No results found for: LABOPIA, COCAINSCRNUR, LABBENZ, AMPHETMU, THCU, LABBARB  Alcohol Level No results found for: [place] I have personally reviewed the radiological images below and agree with the radiology interpretations.  Mr Brain 38 Contrast  Result Date: 12/25/2017 CLINICAL DATA:   77 y/o F; respiratory failure and sedation, possible left occipital infarction. EXAM: MRI HEAD WITHOUT CONTRAST TECHNIQUE: Multiplanar, multiecho pulse sequences of the brain and surrounding structures were obtained without intravenous contrast. COMPARISON:  12/31/2017 CT head.  02/25/2016 MRI of the head. FINDINGS: Brain: 15 mm focus of reduced diffusion in the left occipital lobe corresponding to lesion on CT compatible with acute/early subacute infarction. No associated hemorrhage or mass effect. No additional evidence for acute infarction. No abnormal susceptibility hypointensity to indicate intracranial hemorrhage. No extra-axial collection, hydrocephalus, focal mass effect, or effacement of basilar cisterns. Mild chronic microvascular ischemic changes and parenchymal volume loss of the brain for age. Chronic right occipital lobe infarction. Vascular: Normal flow voids. Skull and upper cervical spine: Normal marrow signal. Sinuses/Orbits: Small mastoid effusions and mucosal thickening of the paranasal sinuses, probably due to intubation. Other: None. IMPRESSION: 1. 15 mm acute/early subacute infarction within the left occipital lobe corresponding to abnormality on CT. No associated hemorrhage or mass effect. 2. Stable mild chronic microvascular ischemic changes and parenchymal volume loss of the brain for age. Stable chronic infarction in right occipital lobe. These results will be called to the ordering clinician or representative by the Radiologist Assistant, and communication documented in the PACS or zVision Dashboard. Electronically  Signed   By: Kristine Garbe M.D.   On: 12/25/2017 19:03   Dg Chest Port 1 View  Result Date: 12/27/2017 CLINICAL DATA:  Acute respiratory insufficiency EXAM: PORTABLE CHEST 1 VIEW COMPARISON:  01/02/2018 FINDINGS: Right lower lobe opacity, likely reflecting a small right pleural effusion with associated atelectasis, although pneumonia is not excluded. Left lung is  clear. No pneumothorax. The heart is normal in size. Endotracheal tube terminates 5.5 cm above the carina. Enteric tube courses into the stomach. Right arm PICC terminates at the cavoatrial junction. IMPRESSION: Endotracheal tube terminates 5.5 cm above the carina. Additional support apparatus as above. Right lower lobe opacity, likely reflecting a small right pleural effusion with associated atelectasis, although pneumonia is not excluded. Electronically Signed   By: Julian Hy M.D.   On: 12/20/2017 11:14   Transthoracic Echocardiogram - pending  LE venous doppler pending  CTA head and neck pending  EEG pending   PHYSICAL EXAM  Temp:  [97.6 F (36.4 C)-98.4 F (36.9 C)] 97.6 F (36.4 C) (04/25 0400) Pulse Rate:  [65-132] 110 (04/25 0814) Resp:  [11-31] 11 (04/25 0814) BP: (94-209)/(46-100) 168/67 (04/25 0814) SpO2:  [94 %-100 %] 99 % (04/25 0814) FiO2 (%):  [30 %] 30 % (04/25 0814)  General - Well nourished, well developed, intubated on sedation.  Ophthalmologic - Fundi not visualized due to small pupils.  Cardiovascular - Regular rate and rhythm.  Neuro - intubated on sedation, eyes half open, not following commands, not blinking to visual threat or tracking objects. Pupil 63mm bilaterally, sluggish to light, no doll's eyes, no corneal bilaterally, but weak gag. On pain stimulation, no significant movement in all limbs. With sternal rub, she was moving trunk for discomfort. DTR diminished and no babinski. Sensation, coordination and gait not tested.   ASSESSMENT/PLAN Melinda Schroeder is a 77 y.o. female with history of hypertension, hyperlipidemia, CAD, COPD, smoker presenting with acute shortness breath and altered mental status.  Acute respiratory distress  Intubated on ventiation  CCM on board  12/28/17 CTA chest showed no PE - right LL atelectasis  Sudden onset SOB as per husband  CXR right base infiltration and small pleural effusion  leukocystosis - WBC  18.5  On zosyn  LE venous doppler pending  Stroke:  Acute left distal inferior MCA infarct - embolic pattern, etiology likely due to newly diagnosed afib  Resultant  AMS, intubated  CT head 12/31/17 - new left occipital infarct, old right PCA infarct  MRI head acute left distal inferior MCA infarct, old right PCA infarct  CTA head and neck pending  2D Echo - pending  LDL 122  HgbA1c - pending  VTE prophylaxis - heparin subq  Diet NPO time specified  aspirin 81 mg daily prior to admission, now on aspirin 300 mg suppository daily. Pending MRI and po access to decide on anticoagulation.   Ongoing aggressive stroke risk factor management  Therapy recommendations:  pending  Disposition:  Pending  Afib - new disagnosis  Documented on chart afib RVR last night  Today in tele no afib  As per Dr. Dominica Severin, pt in and out of afib this am  On ASA now  Pending MRI and po access to decide on anticoagulation.   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  MRI at the time showed right PCA old infarct, MRA  head and neck showed right ICA right VA, right PCA stenosis.  EEG negative for seizure, carotid Doppler, left 40-59% stenosis, EF 55 to 60%, LDL 88 and A1c 5.7.   Hypertension  Stable  On home metoprolol . Long-term BP goal normotensive  Hyperlipidemia  Lipid lowering medication PTA:  zocor  LDL 122, goal < 70  Current lipid lowering medication:zpcor 40  Continue statin at discharge  Tobacco abuse  Current smoker  Smoking cessation counseling will be provided  Nicotine patch provided  Other Stroke Risk Factors  Advanced age  Hx stroke/TIA - MRI chronic right PCA infarct  Coronary artery disease  Other Active Problems  COPD  Gastric mass - has outpt follow up  with CTA abdomen and pelvis - no significant finding  Hospital day # 1  This patient is critically ill due to stroke, respiratory failure, afib with RVR hx of meningitis, COPD, tobacco use and at significant risk of neurological worsening, death form recurrent stroke, pneumonia, heart failure, respiratory failure, intubated. This patient's care requires constant monitoring of vital signs, hemodynamics, respiratory and cardiac monitoring, review of multiple databases, neurological assessment, discussion with family, other specialists and medical decision making of high complexity. I spent 40 minutes of neurocritical care time in the care of this patient. I had long discussion with husband at bedside, updated pt current condition, treatment plan and potential prognosis. He expressed understanding and appreciation. I also discussed with Dr. Dominica Severin in neuro ICU.  Rosalin Hawking, MD PhD Stroke Neurology 01/05/2018 10:38 AM   To contact Stroke Continuity provider, please refer to [email or web address] After hours, contact General Neurology

## 2018-01-05 NOTE — Progress Notes (Signed)
EEG completed; results pending.    

## 2018-01-05 NOTE — Progress Notes (Signed)
  Echocardiogram 2D Echocardiogram has been performed.  Melinda Schroeder 01/05/2018, 4:51 PM

## 2018-01-05 NOTE — Procedures (Signed)
Date of recording 01/05/2018  Referring physician Dr. Erlinda Hong  Reason for study Encephalopathy, initial part of the recording was done without sedation currently patient is intubated later part of the recording propofol and fentanyl was restarted.  Technical Digital EEG recording using 10-20 International electrode system.  Description of the recording Dominant posterior rhythm at 6 Hz and reactive Intermittent generalized delta activity. Sleep architecture was not seen. Once propofol was started burst suppression pattern was seen. 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  Epileptiform features were not seen during this recording

## 2018-01-06 ENCOUNTER — Inpatient Hospital Stay (HOSPITAL_COMMUNITY): Payer: Medicare HMO

## 2018-01-06 DIAGNOSIS — E785 Hyperlipidemia, unspecified: Secondary | ICD-10-CM

## 2018-01-06 DIAGNOSIS — I4891 Unspecified atrial fibrillation: Secondary | ICD-10-CM

## 2018-01-06 DIAGNOSIS — J9601 Acute respiratory failure with hypoxia: Secondary | ICD-10-CM

## 2018-01-06 DIAGNOSIS — I10 Essential (primary) hypertension: Secondary | ICD-10-CM

## 2018-01-06 DIAGNOSIS — I700 Atherosclerosis of aorta: Secondary | ICD-10-CM

## 2018-01-06 LAB — POCT I-STAT 3, ART BLOOD GAS (G3+)
ACID-BASE EXCESS: 5 mmol/L — AB (ref 0.0–2.0)
Acid-Base Excess: 5 mmol/L — ABNORMAL HIGH (ref 0.0–2.0)
BICARBONATE: 31.1 mmol/L — AB (ref 20.0–28.0)
Bicarbonate: 30.8 mmol/L — ABNORMAL HIGH (ref 20.0–28.0)
O2 Saturation: 94 %
O2 Saturation: 99 %
PCO2 ART: 56.2 mmHg — AB (ref 32.0–48.0)
PH ART: 7.366 (ref 7.350–7.450)
PO2 ART: 150 mmHg — AB (ref 83.0–108.0)
TCO2: 33 mmol/L — ABNORMAL HIGH (ref 22–32)
TCO2: 32 mmol/L (ref 22–32)
pCO2 arterial: 53.7 mmHg — ABNORMAL HIGH (ref 32.0–48.0)
pH, Arterial: 7.351 (ref 7.350–7.450)
pO2, Arterial: 78 mmHg — ABNORMAL LOW (ref 83.0–108.0)

## 2018-01-06 LAB — CBC WITH DIFFERENTIAL/PLATELET
Basophils Absolute: 0 10*3/uL (ref 0.0–0.1)
Basophils Relative: 0 %
EOS ABS: 0.2 10*3/uL (ref 0.0–0.7)
Eosinophils Relative: 1 %
HEMATOCRIT: 31.5 % — AB (ref 36.0–46.0)
HEMOGLOBIN: 9.7 g/dL — AB (ref 12.0–15.0)
LYMPHS ABS: 1.6 10*3/uL (ref 0.7–4.0)
LYMPHS PCT: 8 %
MCH: 29.1 pg (ref 26.0–34.0)
MCHC: 30.8 g/dL (ref 30.0–36.0)
MCV: 94.6 fL (ref 78.0–100.0)
Monocytes Absolute: 0.6 10*3/uL (ref 0.1–1.0)
Monocytes Relative: 3 %
NEUTROS ABS: 16.4 10*3/uL — AB (ref 1.7–7.7)
NEUTROS PCT: 88 %
Platelets: 316 10*3/uL (ref 150–400)
RBC: 3.33 MIL/uL — AB (ref 3.87–5.11)
RDW: 16.3 % — ABNORMAL HIGH (ref 11.5–15.5)
WBC: 18.8 10*3/uL — AB (ref 4.0–10.5)

## 2018-01-06 LAB — HEMOGLOBIN A1C
Hgb A1c MFr Bld: 6.6 % — ABNORMAL HIGH (ref 4.8–5.6)
Mean Plasma Glucose: 142.72 mg/dL

## 2018-01-06 LAB — GLUCOSE, CAPILLARY
Glucose-Capillary: 186 mg/dL — ABNORMAL HIGH (ref 65–99)
Glucose-Capillary: 139 mg/dL — ABNORMAL HIGH (ref 65–99)
Glucose-Capillary: 234 mg/dL — ABNORMAL HIGH (ref 65–99)
Glucose-Capillary: 128 mg/dL — ABNORMAL HIGH (ref 65–99)
Glucose-Capillary: 142 mg/dL — ABNORMAL HIGH (ref 65–99)

## 2018-01-06 LAB — C DIFFICILE QUICK SCREEN W PCR REFLEX
C DIFFICILE (CDIFF) TOXIN: NEGATIVE
C DIFFICLE (CDIFF) ANTIGEN: POSITIVE — AB

## 2018-01-06 LAB — OCCULT BLOOD X 1 CARD TO LAB, STOOL: Fecal Occult Bld: POSITIVE — AB

## 2018-01-06 LAB — PROCALCITONIN: PROCALCITONIN: 0.29 ng/mL

## 2018-01-06 LAB — CLOSTRIDIUM DIFFICILE BY PCR, REFLEXED: CDIFFPCR: POSITIVE — AB

## 2018-01-06 MED ORDER — SODIUM CHLORIDE 0.9 % IV BOLUS: 250.0000 mL | Freq: Once | INTRAVENOUS | Status: DC

## 2018-01-06 MED ORDER — POTASSIUM CHLORIDE 2 MEQ/ML IV SOLN
INTRAVENOUS | Status: DC
  Administered 2018-01-06 – 2018-01-08 (×3): via INTRAVENOUS
  Filled 2018-01-06 (×7): qty 1000

## 2018-01-06 MED ORDER — POTASSIUM CHLORIDE 20 MEQ PO PACK
40.0000 meq | PACK | Freq: Two times a day (BID) | ORAL | Status: DC
  Administered 2018-01-06 (×2): 40 meq via ORAL
  Filled 2018-01-06 (×2): qty 2

## 2018-01-06 MED ORDER — IPRATROPIUM-ALBUTEROL 0.5-2.5 (3) MG/3ML IN SOLN
3.0000 mL | Freq: Four times a day (QID) | RESPIRATORY_TRACT | Status: DC
  Administered 2018-01-06 – 2018-01-11 (×20): 3 mL via RESPIRATORY_TRACT
  Filled 2018-01-06 (×21): qty 3

## 2018-01-06 MED ORDER — ETOMIDATE 2 MG/ML IV SOLN
0.3000 mg/kg | Freq: Once | INTRAVENOUS | Status: AC
  Administered 2018-01-06: 20 mg via INTRAVENOUS

## 2018-01-06 MED ORDER — VITAMIN C 500 MG/5ML PO SYRP
500.0000 mg | ORAL_SOLUTION | Freq: Two times a day (BID) | ORAL | Status: DC
  Administered 2018-01-06 – 2018-01-13 (×15): 500 mg
  Filled 2018-01-06 (×15): qty 5

## 2018-01-06 MED ORDER — ROCURONIUM BROMIDE 50 MG/5ML IV SOLN
40.0000 mg | Freq: Once | INTRAVENOUS | Status: AC
Start: 2018-01-06 — End: 2018-01-06
  Administered 2018-01-06: 40 mg via INTRAVENOUS
  Filled 2018-01-06: qty 4

## 2018-01-06 MED ORDER — DEXMEDETOMIDINE HCL IN NACL 200 MCG/50ML IV SOLN
0.4000 ug/kg/h | INTRAVENOUS | Status: DC
  Administered 2018-01-06: 0.5 ug/kg/h via INTRAVENOUS
  Administered 2018-01-06: 0.1 ug/kg/h via INTRAVENOUS
  Administered 2018-01-07 (×3): 0.5 ug/kg/h via INTRAVENOUS
  Administered 2018-01-07: 0.6 ug/kg/h via INTRAVENOUS
  Administered 2018-01-07: 0.5 ug/kg/h via INTRAVENOUS
  Administered 2018-01-08: 0.6 ug/kg/h via INTRAVENOUS
  Administered 2018-01-08: 0.8 ug/kg/h via INTRAVENOUS
  Filled 2018-01-06 (×9): qty 50

## 2018-01-06 MED ORDER — ASPIRIN 325 MG PO TABS
325.0000 mg | ORAL_TABLET | Freq: Every day | ORAL | Status: DC
  Administered 2018-01-06 – 2018-01-13 (×8): 325 mg via ORAL
  Filled 2018-01-06 (×8): qty 1

## 2018-01-06 NOTE — Progress Notes (Signed)
Pt NT suctioned through L nare x 2 with copious amounts of white/pink tinged/tan thick and frothy secretions removed. Pt also deep suctioned orally with copious amounts of white/tan/pink tinged thick/frothy secretions removed. RN at bedside. MD made aware. Pt unable to clear secretions at this time.

## 2018-01-06 NOTE — Consult Note (Signed)
   [facility] CM Inpatient Consult   01/06/2018  Melinda Schroeder [birth year] [id number]    Referral received from [facility] office.   Chart reviewed. [name] currently in ICU recently extubated. Will follow up with patient when appropriate.   Marthenia Rolling, MSN-Ed, RN,BSN [facility] Liaison [phone]

## 2018-01-06 NOTE — Procedures (Signed)
Extubation Procedure Note  Patient Details:   Name: Melinda Schroeder DOB: 19-Mar-1941 MRN: [id number]   Airway Documentation:    Vent end date: 01/06/18 Vent end time: 0959   Evaluation  O2 sats: stable throughout Complications: No apparent complications Patient did tolerate procedure well. Bilateral Breath Sounds: Rhonchi, Diminished   No   Pt extubated to 3L [place] per MD order. Pt had positive cuff leak prior to extubation, but unable to cough or speak post extubation. Pt with significant amount of secretions prior to extubation, and unable to clear secretions at this point. CCM MD to bedside, nasal trumpet inserted. RT will closely monitor pt for possible need to re-intubate.   Laymond Purser M 01/06/2018, 10:07 AM

## 2018-01-06 NOTE — Progress Notes (Signed)
STROKE TEAM PROGRESS NOTE   SUBJECTIVE (INTERVAL HISTORY) Her husband is at the bedside.  Pt was extubated this am but now not tolerating well. Still has significant SOB, and gasping for air. Put on nasal trumpet but did not improve much. Likely need to be re-intubated.   OBJECTIVE Temp:  [98 F (36.7 C)-98.6 F (37 C)] 98 F (36.7 C) (04/26 0800) Pulse Rate:  [68-128] 99 (04/26 0927) Cardiac Rhythm: Normal sinus rhythm (04/26 0000) Resp:  [12-21] 17 (04/26 0742) BP: (84-235)/(41-132) 195/89 (04/26 0956) SpO2:  [94 %-100 %] 96 % (04/26 1000) Arterial Line BP: (81-208)/(37-95) 81/37 (04/26 0700) FiO2 (%):  [30 %-40 %] 40 % (04/26 0742)  CBC:  Recent Labs  Lab 01/01/2018 1200 01/05/18 0412 01/05/18 1221  WBC 13.6* 18.5*  --   HGB 10.5* 9.5* 6.5*  HCT 31.4* 29.6* 19.0*  MCV 88.0 90.5  --   PLT 290 296  --     Basic Metabolic Panel:  Recent Labs  Lab 01/02/2018 1523 01/05/18 0412 01/05/18 1221  NA 135 136 140  K 2.9* 3.3* 2.8*  CL 93* 94* 107  CO2 28 30  --   GLUCOSE 210* 189* 120*  BUN 20 27* 16  CREATININE 0.86 0.83 0.40*  CALCIUM 7.9* 8.0*  --   MG  --  1.8  --   PHOS  --  2.7  --     Lipid Panel:     Component Value Date/Time   CHOL 200 01/05/2018 0412   TRIG 181 (H) 01/05/2018 0412   HDL 42 01/05/2018 0412   CHOLHDL 4.8 01/05/2018 0412   VLDL 36 01/05/2018 0412   LDLCALC 122 (H) 01/05/2018 0412   HgbA1c:  Lab Results  Component Value Date   HGBA1C 6.6 (H) 01/06/2018   Urine Drug Screen: No results found for: LABOPIA, COCAINSCRNUR, LABBENZ, AMPHETMU, THCU, LABBARB  Alcohol Level No results found for: [place] I have personally reviewed the radiological images below and agree with the radiology interpretations.  Transthoracic Echocardiogram - Left ventricle: The cavity size was normal. Wall thickness was   increased in a pattern of moderate to severe LVH. Systolic   function was normal. The estimated ejection fraction was in the   range of 60%  to 65%. Wall motion was normal; there were no   regional wall motion abnormalities. Doppler parameters are   consistent with abnormal left ventricular relaxation (grade 1   diastolic dysfunction). - Mitral valve: Moderately calcified annulus. - Pulmonary arteries: Systolic pressure was mildly to moderately   increased. PA peak pressure: 43 mm Hg (S). Impressions: - No cardiac source of embolism was identified, but cannot be ruled   out on the basis of this examination.  LE venous doppler  Technically challenged study due to patient's body habitus and edema. No evidence of obvious deep vein thrombosis in both legs. 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 In addition, two small hematoma in the proximal right thigh.   Ct Chest Wo Contrast  Result Date: 01/05/2018 CLINICAL DATA:  77 y/o  F; acute respiratory illness. EXAM: CT CHEST WITHOUT CONTRAST TECHNIQUE: Multidetector CT imaging of the chest was performed following the standard protocol without IV contrast. COMPARISON:  12/28/2017 CT chest. FINDINGS: Cardiovascular: Right PICC line tip in the right atrium. Dense mitral annular calcification. Mild to moderate coronary artery calcification. Normal heart size. No pericardial effusion. Normal caliber thoracic aorta and  main pulmonary artery. Severe calcific atherosclerosis of the thoracic aorta. Mediastinum/Nodes: No enlarged mediastinal or axillary lymph nodes. Thyroid gland, trachea, and esophagus demonstrate no significant findings. Enteric tube tip in the distal esophagus. Stable position of endotracheal tube in mid trachea. Lungs/Pleura: Small bilateral pleural effusions. Dependent consolidation in the right lower lobe. Patchy ground-glass opacities in the left upper lobe. Centrilobular emphysema in the lung apices. Interval improvement in interstitial edema with minimal residual interlobular septal  thickening. Upper Abdomen: Small volume of perihepatic ascites. Musculoskeletal: No fracture is seen. IMPRESSION: 1. Increased small bilateral effusions and consolidation in the dependent right lower lobe which may represent associated atelectasis or pneumonia. 2. Interval improvement in interstitial edema mild residual interlobular septal thickening. 3. Small volume perihepatic ascites. 4. Coronary and aortic calcific atherosclerosis. 5. Enteric tube tip in lower esophagus, advancement recommended. Electronically Signed   By: Kristine Garbe M.D.   On: 01/05/2018 18:52   Dg Abd Portable 1v  Result Date: 01/05/2018 CLINICAL DATA:  Orogastric tube placement EXAM: PORTABLE ABDOMEN - 1 VIEW COMPARISON:  CT 01/08/2018 FINDINGS: Targeted exam for gastric tube position. The tip and side port of a gastric tube are seen below the left hemidiaphragm in the expected location of the proximal stomach. Central line catheter tip terminates at the cavoatrial junction. There is cardiomegaly. Probable small right effusion. IMPRESSION: Tip and side port of a gastric tube are seen within the expected location of the proximal stomach in the left upper quadrant of the abdomen. Electronically Signed   By: Ashley Royalty M.D.   On: 01/05/2018 23:23   Ct Angio Head W Or Wo Contrast  Result Date: 01/05/2018 CLINICAL DATA:  Stroke follow-up EXAM: CT ANGIOGRAPHY HEAD AND NECK TECHNIQUE: Multidetector CT imaging of the head and neck was performed using the standard protocol during bolus administration of intravenous contrast. Multiplanar CT image reconstructions and MIPs were obtained to evaluate the vascular anatomy. Carotid stenosis measurements (when applicable) are obtained utilizing NASCET criteria, using the distal internal carotid diameter as the denominator. CONTRAST:  42mL ISOVUE-370 IOPAMIDOL (ISOVUE-370) INJECTION 76% COMPARISON:  Brain MRI from yesterday FINDINGS: CT HEAD FINDINGS Brain: Known recent infarct in the  left occipital pole. Known remote right occipital pole infarct. No hemorrhage, hydrocephalus, or collection. Vascular: Atherosclerotic calcification. Skull: Negative Sinuses: Mild mucosal thickening in the paranasal sinuses. Orbits: Negative Review of the MIP images confirms the above findings CTA NECK FINDINGS Aortic arch: Extensive atherosclerotic and irregular plaque. Three vessel branching. Right carotid system: Diffuse atheromatous wall thickening in the brachiocephalic to the ICA bulb. No flow limiting stenosis or ulceration. Negative for beading or dissection. Left carotid system: Atheromatous plaque causes mild narrowing of the proximal and distal common carotid. Diffusely patent ICA. No beading or dissection. Vertebral arteries: Extensive atherosclerosis along the proximal left subclavian without flow limiting stenosis. Atherosclerotic irregularity of bilateral V4 segments without flow limiting stenosis. No ulcerated finding. Skeleton: No acute finding. C4-5 ACDF with solid arthrodesis. C5-6 posterior fusion hardware without visible bony fusion. Other neck: Endotracheal and orogastric tubes in place. No acute finding. Upper chest: Emphysema and airway thickening. Small pleural effusions where seen. Review of the MIP images confirms the above findings CTA HEAD FINDINGS Anterior circulation: Mild atherosclerotic plaque on the carotid siphons. No branch occlusion or flow limiting stenosis. Posterior circulation: The left PCA is fetal type; no visible left P1 segment. Dominant left vertebral artery with most of right vertebral flow into the PICA. There is advanced narrowing of the proximal right P2 segment  with some downstream flow visible. Venous sinuses: Patent Anatomic variants: As above Delay hip ed phase: There is enhancement at the left occipital infarct compatible with subacute timing. Review of the MIP images confirms the above findings IMPRESSION: 1. No emergent arterial finding. 2. The small left  occipital infarct is enhancing consistent with subacute timing. The left PCA is fetal type. No flow limiting stenosis or definite embolic source seen in the left carotid circulation. 3. Advanced and extensive atherosclerosis with extensive irregular plaque in the visualized aorta. 4. Advanced narrowing of the proximal right PCA. Remote right occipital infarct. 5. Emphysema (ICD10-J43.9). Electronically Signed   By: Monte Fantasia M.D.   On: 01/05/2018 11:58   Ct Angio Neck W Or Wo Contrast  Result Date: 01/05/2018 CLINICAL DATA:  Stroke follow-up EXAM: CT ANGIOGRAPHY HEAD AND NECK TECHNIQUE: Multidetector CT imaging of the head and neck was performed using the standard protocol during bolus administration of intravenous contrast. Multiplanar CT image reconstructions and MIPs were obtained to evaluate the vascular anatomy. Carotid stenosis measurements (when applicable) are obtained utilizing NASCET criteria, using the distal internal carotid diameter as the denominator. CONTRAST:  42mL ISOVUE-370 IOPAMIDOL (ISOVUE-370) INJECTION 76% COMPARISON:  Brain MRI from yesterday FINDINGS: CT HEAD FINDINGS 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 right occipital pole infarct. No hemorrhage, hydrocephalus, or collection. Vascular: Atherosclerotic calcification. Skull: Negative Sinuses: Mild mucosal thickening in the paranasal sinuses. Orbits: Negative Review of the MIP images confirms the above findings CTA NECK FINDINGS Aortic arch: Extensive atherosclerotic and irregular plaque. Three vessel branching. Right carotid system: Diffuse atheromatous wall thickening in the brachiocephalic to the ICA bulb. No flow limiting stenosis or ulceration. Negative for beading or dissection. Left carotid system: Atheromatous plaque causes mild narrowing of the proximal and distal common carotid. Diffusely patent ICA. No beading or dissection. Vertebral arteries: Extensive atherosclerosis along the proximal  left subclavian without flow limiting stenosis. Atherosclerotic irregularity of bilateral V4 segments without flow limiting stenosis. No ulcerated finding. Skeleton: No acute finding. C4-5 ACDF with solid arthrodesis. C5-6 posterior fusion hardware without visible bony fusion. Other neck: Endotracheal and orogastric tubes in place. No acute finding. Upper chest: Emphysema and airway thickening. Small pleural effusions where seen. Review of the MIP images confirms the above findings CTA HEAD FINDINGS Anterior circulation: Mild atherosclerotic plaque on the carotid siphons. No branch occlusion or flow limiting stenosis. Posterior circulation: The left PCA is fetal type; no visible left P1 segment. Dominant left vertebral artery with most of right vertebral flow into the PICA. There is advanced narrowing of the proximal right P2 segment with some downstream flow visible. Venous sinuses: Patent Anatomic variants: As above Delay hip ed phase: There is enhancement at the left occipital infarct compatible with subacute timing. Review of the MIP images confirms the above findings IMPRESSION: 1. No emergent arterial finding. 2. The small left occipital infarct is enhancing consistent with subacute timing. The left PCA is fetal type. No flow limiting stenosis or definite embolic source seen in the left carotid circulation. 3. Advanced and extensive atherosclerosis with extensive irregular plaque in the visualized aorta. 4. Advanced narrowing of the proximal right PCA. Remote right occipital infarct. 5. Emphysema (ICD10-J43.9). Electronically Signed   By: Monte Fantasia M.D.   On: 01/05/2018 11:58   Ct Chest Wo Contrast  Result Date: 01/05/2018 CLINICAL DATA:  77 y/o  F; acute respiratory illness. EXAM: CT CHEST WITHOUT CONTRAST TECHNIQUE: Multidetector CT imaging of the chest was performed following the  standard protocol without IV contrast. COMPARISON:  12/28/2017 CT chest. FINDINGS: Cardiovascular: Right PICC line tip  in the right atrium. Dense mitral annular calcification. Mild to moderate coronary artery calcification. Normal heart size. No pericardial effusion. Normal caliber thoracic aorta and main pulmonary artery. Severe calcific atherosclerosis of the thoracic aorta. Mediastinum/Nodes: No enlarged mediastinal or axillary lymph nodes. Thyroid gland, trachea, and esophagus demonstrate no significant findings. Enteric tube tip in the distal esophagus. Stable position of endotracheal tube in mid trachea. Lungs/Pleura: Small bilateral pleural effusions. Dependent consolidation in the right lower lobe. Patchy ground-glass opacities in the left upper lobe. Centrilobular emphysema in the lung apices. Interval improvement in interstitial edema with minimal residual interlobular septal thickening. Upper Abdomen: Small volume of perihepatic ascites. Musculoskeletal: No fracture is seen. IMPRESSION: 1. Increased small bilateral effusions and consolidation in the dependent right lower lobe which may represent associated atelectasis or pneumonia. 2. Interval improvement in interstitial edema mild residual interlobular septal thickening. 3. Small volume perihepatic ascites. 4. Coronary and aortic calcific atherosclerosis. 5. Enteric tube tip in lower esophagus, advancement recommended. Electronically Signed   By: Kristine Garbe M.D.   On: 01/05/2018 18:52   Mr Brain Wo Contrast  Result Date: 12/20/2017 CLINICAL DATA:  77 y/o F; respiratory failure and sedation, possible left occipital infarction. EXAM: MRI HEAD WITHOUT CONTRAST TECHNIQUE: Multiplanar, multiecho pulse sequences of the brain and surrounding structures were obtained without intravenous contrast. COMPARISON:  12/31/2017 CT head.  02/25/2016 MRI of the head. FINDINGS: Brain: 15 mm focus of reduced diffusion in the left occipital lobe corresponding to lesion on CT compatible with acute/early subacute infarction. No associated hemorrhage or mass effect. No  additional evidence for acute infarction. No abnormal susceptibility hypointensity to indicate intracranial hemorrhage. No extra-axial collection, hydrocephalus, focal mass effect, or effacement of basilar cisterns. Mild chronic microvascular ischemic changes and parenchymal volume loss of the brain for age. Chronic right occipital lobe infarction. Vascular: Normal flow voids. Skull and upper cervical spine: Normal marrow signal. Sinuses/Orbits: Small mastoid effusions and mucosal thickening of the paranasal sinuses, probably due to intubation. Other: None. IMPRESSION: 1. 15 mm acute/early subacute infarction within the left occipital lobe corresponding to abnormality on CT. No associated hemorrhage or mass effect. 2. Stable mild chronic microvascular ischemic changes and parenchymal volume loss of the brain for age. Stable chronic infarction in right occipital lobe. These results will be called to the ordering clinician or representative by the Radiologist Assistant, and communication documented in the PACS or zVision Dashboard. Electronically Signed   By: Kristine Garbe M.D.   On: 12/19/2017 19:03   Dg Chest Port 1 View  Result Date: 01/05/2018 CLINICAL DATA:  Respiratory insufficiency.  CVA. EXAM: PORTABLE CHEST 1 VIEW COMPARISON:  12/16/2017. FINDINGS: Endotracheal tube and NG tube in stable position. Right IJ line in stable position, tip in the right atrium. Heart size normal. Mild right base and effusion again noted. No interim change. No pneumothorax. Prior cervical spine fusion. IMPRESSION: 1. Lines and tubes in stable position. Right PICC line in unchanged position with tip in right atrium. 2. Right base infiltrate and small right pleural effusion again noted without interim change. Electronically Signed   By: Marcello Moores  Register   On: 01/05/2018 09:51   Dg Chest Port 1 View  Result Date: 01/02/2018 CLINICAL DATA:  Acute respiratory insufficiency EXAM: PORTABLE CHEST 1 VIEW COMPARISON:   01/02/2018 FINDINGS: Right lower lobe opacity, likely reflecting a small right pleural effusion with associated atelectasis, although pneumonia is not excluded.  Left lung is clear. No pneumothorax. The heart is normal in size. Endotracheal tube terminates 5.5 cm above the carina. Enteric tube courses into the stomach. Right arm PICC terminates at the cavoatrial junction. IMPRESSION: Endotracheal tube terminates 5.5 cm above the carina. Additional support apparatus as above. Right lower lobe opacity, likely reflecting a small right pleural effusion with associated atelectasis, although pneumonia is not excluded. Electronically Signed   By: Julian Hy M.D.   On: 12/29/2017 11:14   Dg Abd Portable 1v  Result Date: 01/05/2018 CLINICAL DATA:  Orogastric tube placement EXAM: PORTABLE ABDOMEN - 1 VIEW COMPARISON:  CT 01/08/2018 FINDINGS: Targeted exam for gastric tube position. The tip and side port of a gastric tube are seen below the left hemidiaphragm in the expected location of the proximal stomach. Central line catheter tip terminates at the cavoatrial junction. There is cardiomegaly. Probable small right effusion. IMPRESSION: Tip and side port of a gastric tube are seen within the expected location of the proximal stomach in the left upper quadrant of the abdomen. Electronically Signed   By: Ashley Royalty M.D.   On: 01/05/2018 23:23   EEG The EEG is abnormal and findings are suggestive of moderate generalized cerebral dysfunction.  Epileptiform features were not seen during this recording    PHYSICAL EXAM  Temp:  [98 F (36.7 C)-98.6 F (37 C)] 98 F (36.7 C) (04/26 0800) Pulse Rate:  [68-128] 99 (04/26 0927) Resp:  [12-21] 17 (04/26 0742) BP: (84-235)/(41-132) 195/89 (04/26 0956) SpO2:  [94 %-100 %] 96 % (04/26 1000) Arterial Line BP: (81-208)/(37-95) 81/37 (04/26 0700) FiO2 (%):  [30 %-40 %] 40 % (04/26 0742)  General - Well nourished, well developed, extubated but in significant  respiratory distress.  Ophthalmologic - Fundi not visualized due to distress.  Cardiovascular - Regular rate and rhythm, not in afib during round.  Respiratory - coarse rhonchi bilaterally and b/l crackles at lower lungs   Extremities - b/l LE edematous, multiple ecchymosis b/l UEs and LEs  Neuro - extubated but in significant respiratory distress, on nasal trumpet, eyes half open, not following commands, not blinking to visual threat or tracking objects. Pupil 39mm bilaterally, sluggish to light, eye midline, weak corneal bilaterally, positive gag. On pain stimulation, b/l UE 2+/5 proximally, 0/5 distally, no significant movement in BLEs. Able to move trunk for discomfort. DTR diminished and no babinski. Sensation, coordination and gait not tested.   ASSESSMENT/PLAN Ms. Kodie Pick Meline is a 77 y.o. female with history of hypertension, hyperlipidemia, CAD, COPD, smoker presenting with acute shortness breath and altered mental status.  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  MRI stroke small, OK to start anticoagulation from stroke standpoint. Due to multiple acute medical condition, may consider heparin drip if appropriate from CCM standpoint.   Ongoing aggressive stroke risk factor management  Therapy recommendations:  pending  Disposition:  Pending  Afib with RVR- new disagnosis  Documented on chart afib RVR  In and out of afib overnight  Today in tele no afib  On IV cardizem  On ASA now   MRI stroke small, OK to start anticoagulation from stroke standpoint. Due to multiple acute medical condition, may consider heparin drip if appropriate from CCM standpoint.   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  MRI at the time showed right PCA old infarct, MRA head and neck showed right ICA right VA, right PCA stenosis.  EEG negative for seizure, carotid Doppler, left 40-59% stenosis, EF 55 to 60%, LDL 88 and A1c 5.7.   Hypertension  Stable  On home metoprolol . Long-term BP goal normotensive  Hyperlipidemia  Lipid lowering medication PTA:  zocor  LDL 122, goal < 70  Current lipid lowering medication:zpcor 40  Continue statin at discharge  Tobacco abuse  Current smoker  Smoking cessation counseling will be provided  Nicotine patch provided  Other Stroke Risk Factors  Advanced age  Hx stroke/TIA - MRI chronic right PCA infarct  Coronary artery disease  Other Active Problems  COPD  Gastric mass - has outpt follow up with CTA abdomen and pelvis - no significant finding  Hospital day # 2  This patient is critically ill due to stroke, respiratory failure, afib with RVR hx of meningitis, COPD, tobacco use and at significant risk of neurological worsening, death form recurrent stroke, pneumonia, heart failure, respiratory failure,  intubated. This patient's care requires constant monitoring of vital signs, hemodynamics, respiratory and cardiac monitoring, review of multiple databases, neurological assessment, discussion with family, other specialists and medical decision making of high complexity. I spent 40 minutes of neurocritical care time in the care of this patient. I had long discussion with husband at bedside, updated pt current condition, treatment plan and potential prognosis. He expressed understanding and appreciation. I also discussed with Dr. Dominica Severin in neuro ICU.  Rosalin Hawking, MD PhD Stroke Neurology 01/06/2018 10:18 AM   To contact Stroke Continuity provider, please refer to [email or web address] After hours, contact General Neurology

## 2018-01-06 NOTE — Progress Notes (Signed)
PULMONARY / CRITICAL CARE MEDICINE   Name: Melinda Schroeder MRN: [id number] DOB: 06-03-1941    ADMISSION DATE:  12/25/2017  CHIEF COMPLAINT:  Respiratory distress, CVA, question of seizure  HISTORY OF PRESENT ILLNESS:   77 year old female who presented to [facility] 4/17 via EMS with reports of respiratory distress.  She carries a medical history of CAD, HTN, HLD, GERD, diverticulitis, esophageal dysmotility, prior esophageal dilatation, tobacco abuse, COPD.  On 4/4 she had an upper endoscopy by Dr. 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  Per prior documentation, EMS was called for respiratory distress.  Upon EMS arrival she was in such extremis that she was tripoding.  The patient vomited with concern for aspiration.  She had been having significant reflux in the days prior.  They administered albuterol at which point she reportedly became agonal with questionable seizure activity.  EMS administered Versed for possible seizure.  The family reported that the patient complained of wheezing on 4/16.  She has been seen by her primary care provider on Monday 4/15 with a reported unremarkable visit.  The patient was intubated upon arrival to the emergency room (1 attempt documented, MAC 4 blade).  Multiple attempts at central venous access placement noted including right femoral left IJ and right IJ without success.  She ultimately had a PICC line placed in the right upper extremity.  In addition she underwent lumbar puncture in the setting of fever to 104 with reportedly clear CSF removed.  Chest x-ray post ET tube placement showed hyperinflation per radiology read with no active disease and ET tube 8 cm above the carina.  CT of the head without contrast showed an old right occipital infarct, no acute intracranial abnormality.  CT angiogram of the  chest demonstrated no PE, mild interstitial edema, focal atelectasis in the right lower lobe with question of an infected bleb in the right lower lobe, aortic atherosclerosis and emphysema, left ventricular hypertrophy.  Patient initially required levophed for hypotension.  She was given multiple fluid boluses.  Lactic acid cleared.  Notes reflect that the patient had agitated delirium upon wake up assessments.  She was treated with IV steroids and nebulized bronchodilators.  She had ongoing agitation and altered mental status (not clear what sedation pt was on at the time) and repeat CT imaging of the head was obtained on 4/20 which showed an interval development of acute early subacute small left PCA territory infarct involving the left occipital pole.  No associated hemorrhage or mass-effect.  This was new from the prior study on 4/17.  Chronic right PCA territory infarct stable. 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 Notes from 4/20 raise concern of possible aspiration PNA.  Daughter-in-law reports NGT was displaced and the patient was gurgling on the vent / TF held.  Additionally, she had documented atrial fibrillation treated with cardizem. She was seen by [facility].  Decision was made for transfer to [facility] for further evaluation 4/24.  She is deeply sedated on a combination of propofol and fentanyl this morning and is not at all interactive.  She has been intermittently in atrial fibrillation.  I asked her husband whether there are risks involved  with type coagulation, specifically whether her GI problems were diagnosed after a GI bleed.  He is not aware that she has had any blood loss from her GI tract.  He does report that she seems to neck and bleed easily and that when they withdrew a line from her on at the previous hospital she had prolonged blood loss.  MRI 4/25 has shown a new acute left occipital infarct. Afternoon of 4/25 she was having intermittent atrial fibrillation for which she is now on Cardizem.  She is subsequently reverted to normal sinus rhythm.  She is undergoing a spontaneous breathing trial this morning.  She is sedated on propofol and although she spontaneously eye open she does not interact with me.   PAST MEDICAL HISTORY :  She  has a past medical history of Anginal pain ([place]), Anxiety, Bronchitis, allergic, COPD (chronic obstructive pulmonary disease) ([place]), Coronary artery disease, DDD (degenerative disc disease), Diverticulitis, Esophageal dysmotilities, Gastritis, GERD (gastroesophageal reflux disease), Hypercholesterolemia, Hypertension, IBS (irritable bowel syndrome), Restless legs, Sepsis ([place]) (02/2016), and Shortness of breath dyspnea.  PAST SURGICAL HISTORY: She  has a past surgical history that includes Bladder surgery; breast mass right excision; Hernia repair; Coronary stent placement; Appendectomy; shoulder sugery (Left); Anterior cervical decomp/discectomy fusion (N/A, 06/08/2013); Abdominal hysterectomy; EUS (N/A, 10/17/2014); EUS (N/A, 12/04/2015); Laminectomy (Left, 02/11/2016); and EUS (N/A, 12/15/2017).  Allergies  Allergen Reactions  . Ace Inhibitors Cough  . Sulfa Antibiotics Nausea Only    No current facility-administered medications on file prior to encounter.    Current Outpatient Medications on File Prior to Encounter  Medication Sig  . albuterol (PROVENTIL HFA;VENTOLIN HFA) 108 (90 Base) MCG/ACT inhaler Inhale 2 puffs into the lungs every 6 (six) hours as needed for wheezing or shortness of  breath.  . ALPRAZolam (XANAX) 0.25 MG tablet Take 0.25 mg by mouth 3 (three) times daily as needed for anxiety.  [name] aspirin 81 MG chewable tablet Chew 1 tablet (81 mg total) by mouth daily.  . cyanocobalamin (,VITAMIN B-12,) 1000 MCG/ML injection Inject 1,000 mcg into the muscle every 30 (thirty) days.  [name] HYDROcodone-acetaminophen (NORCO) 10-325 MG tablet Take 1 tablet by mouth 3 (three) times daily as needed for severe pain.  [name] losartan (COZAAR) 100 MG tablet Take 100 mg by mouth daily.  . magnesium oxide (MAG-OX) 400 MG tablet Take 400 mg by mouth daily.  . metoprolol succinate (TOPROL-XL) 100 MG 24 hr tablet Take 100 mg by mouth 2 (two) times daily. Take with or immediately following a meal.  . mirtazapine (REMERON) 15 MG tablet Take 15 mg by mouth at bedtime.  . nitroGLYCERIN (NITROSTAT) 0.4 MG SL tablet Place 0.4 mg under the tongue every 5 (five) minutes as needed for chest pain.  . Nutritional Supplements (ESTROVEN PO) Take 1 tablet by mouth at bedtime.  [name] omeprazole (PRILOSEC) 20 MG capsule Take 20 mg by mouth 2 (two) times daily before a meal.   . PARoxetine (PAXIL) 20 MG tablet Take 20 mg by mouth every morning.   . potassium chloride (K-DUR,KLOR-CON) 10 MEQ tablet Take 10 mEq by mouth 2 (two) times daily.  . promethazine (PHENERGAN) 25 MG tablet Take 25 mg by mouth every 4 (four) hours as needed for nausea or vomiting.  . simvastatin (ZOCOR) 40 MG  tablet Take 40 mg by mouth at bedtime.     FAMILY HISTORY:  Her indicated that the status of her mother is unknown. She indicated that the status of her father is unknown. She indicated that the status of her brother is unknown.   SOCIAL HISTORY: She  reports that she has been smoking cigarettes.  She has a 50.00 pack-year smoking history. She has never used smokeless tobacco. She reports that she does not drink alcohol or use drugs.  REVIEW OF SYSTEMS:   Unobtainable  SUBJECTIVE:  Unobtainable  VITAL SIGNS: BP (!) 151/57   Pulse  81   Temp 98 F (36.7 C) (Axillary)   Resp 17   Ht _0  (1.549 m)   Wt 149 lb 14.6 oz (68 kg)   SpO2 100%   BMI 28.33 kg/m   HEMODYNAMICS:    VENTILATOR SETTINGS: Vent Mode: PRVC FiO2 (%):  [30 %-40 %] 40 % Set Rate:  [16 bmp] 16 bmp Vt Set:  [450 mL] 450 mL PEEP:  [5 cmH20] 5 cmH20 Plateau Pressure:  [18 cmH20-26 cmH20] 26 cmH20  INTAKE / OUTPUT: I/O last 3 completed shifts: In: 4751.3 [I.V.:2782.7; NG/GT:1518.7; IV Piggyback:450] Out: 2185 [Urine:2185]  PHYSICAL EXAMINATION: General: Elderly ill-appearing female who is orally intubated mechanically ventilated sedated and in no acute distress Neuro: Pupils equal and reactive, no response to voice or sternal rub Cardiovascular: S1 and S2 are currently regular without murmur rub or gallop Lungs: Respirations are unlabored, there is decreased air movement at the right base, she is wheezing more prominently on the right. Abdomen: Abdomen is obese and soft without any organomegaly masses or tenderness. Skin: It is extraordinarily friable with bleeding after performing a sternal rub.  She has diffuse scattered ecchymoses.  LABS:  BMET Recent Labs  Lab 12/16/2017 1523 01/05/18 0412 01/05/18 1221  NA 135 136 140  K 2.9* 3.3* 2.8*  CL 93* 94* 107  CO2 28 30  --   BUN 20 27* 16  CREATININE 0.86 0.83 0.40*  GLUCOSE 210* 189* 120*    Electrolytes Recent Labs  Lab 12/25/2017 1523 01/05/18 0412  CALCIUM 7.9* 8.0*  MG  --  1.8  PHOS  --  2.7    CBC Recent Labs  Lab 12/19/2017 1200 01/05/18 0412 01/05/18 1221  WBC 13.6* 18.5*  --   HGB 10.5* 9.5* 6.5*  HCT 31.4* 29.6* 19.0*  PLT 290 296  --     Coag's Recent Labs  Lab 01/05/18 1200  APTT 24  INR 1.17    Sepsis Markers Recent Labs  Lab 12/23/2017 1523  PROCALCITON 0.33    ABG Recent Labs  Lab 12/28/2017 1210 01/05/18 0330  PHART 7.504* 7.423  PCO2ART 43.2 50.8*  PO2ART 92.0 104    Liver Enzymes Recent Labs  Lab 12/18/2017 1523  AST 17  ALT  22  ALKPHOS 45  BILITOT 0.9  ALBUMIN 2.9*    Cardiac Enzymes No results for input(s): TROPONINI, PROBNP in the last 168 hours.  Glucose Recent Labs  Lab 01/05/18 1144 01/05/18 1542 01/05/18 1953 01/05/18 2337 01/06/18 0423 01/06/18 0735  GLUCAP 150* 195* 135* 125* 186* 139*    Imaging Ct Angio Head W Or Wo Contrast  Result Date: 01/05/2018 CLINICAL DATA:  Stroke follow-up EXAM: CT ANGIOGRAPHY HEAD AND NECK TECHNIQUE: Multidetector CT imaging of the head and neck was performed using the standard protocol during bolus administration of intravenous contrast. Multiplanar CT image reconstructions and MIPs were obtained to evaluate the vascular  anatomy. Carotid stenosis measurements (when applicable) are obtained utilizing NASCET criteria, using the distal internal carotid diameter as the denominator. CONTRAST:  58m ISOVUE-370 IOPAMIDOL (ISOVUE-370) INJECTION 76% COMPARISON:  Brain MRI from yesterday FINDINGS: CT HEAD FINDINGS 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 right occipital pole infarct. No hemorrhage, hydrocephalus, or collection. Vascular: Atherosclerotic calcification. Skull: Negative Sinuses: Mild mucosal thickening in the paranasal sinuses. Orbits: Negative Review of the MIP images confirms the above findings CTA NECK FINDINGS Aortic arch: Extensive atherosclerotic and irregular plaque. Three vessel branching. Right carotid system: Diffuse atheromatous wall thickening in the brachiocephalic to the ICA bulb. No flow limiting stenosis or ulceration. Negative for beading or dissection. Left carotid system: Atheromatous plaque causes mild narrowing of the proximal and distal common carotid. Diffusely patent ICA. No beading or dissection. Vertebral arteries: Extensive atherosclerosis along the proximal left subclavian without flow limiting stenosis. Atherosclerotic irregularity of bilateral V4 segments without flow limiting stenosis. No ulcerated finding.  Skeleton: No acute finding. C4-5 ACDF with solid arthrodesis. C5-6 posterior fusion hardware without visible bony fusion. Other neck: Endotracheal and orogastric tubes in place. No acute finding. Upper chest: Emphysema and airway thickening. Small pleural effusions where seen. Review of the MIP images confirms the above findings CTA HEAD FINDINGS Anterior circulation: Mild atherosclerotic plaque on the carotid siphons. No branch occlusion or flow limiting stenosis. Posterior circulation: The left PCA is fetal type; no visible left P1 segment. Dominant left vertebral artery with most of right vertebral flow into the PICA. There is advanced narrowing of the proximal right P2 segment with some downstream flow visible. Venous sinuses: Patent Anatomic variants: As above Delay hip ed phase: There is enhancement at the left occipital infarct compatible with subacute timing. Review of the MIP images confirms the above findings IMPRESSION: 1. No emergent arterial finding. 2. The small left occipital infarct is enhancing consistent with subacute timing. The left PCA is fetal type. No flow limiting stenosis or definite embolic source seen in the left carotid circulation. 3. Advanced and extensive atherosclerosis with extensive irregular plaque in the visualized aorta. 4. Advanced narrowing of the proximal right PCA. Remote right occipital infarct. 5. Emphysema (ICD10-J43.9). Electronically Signed   By: JMonte FantasiaM.D.   On: 01/05/2018 11:58   Ct Angio Neck W Or Wo Contrast  Result Date: 01/05/2018 CLINICAL DATA:  Stroke follow-up EXAM: CT ANGIOGRAPHY HEAD AND NECK TECHNIQUE: Multidetector CT imaging of the head and neck was performed using the standard protocol during bolus administration of intravenous contrast. Multiplanar CT image reconstructions and MIPs were obtained to evaluate the vascular anatomy. Carotid stenosis measurements (when applicable) are obtained utilizing NASCET criteria, using the distal internal  carotid diameter as the denominator. CONTRAST:  529mISOVUE-370 IOPAMIDOL (ISOVUE-370) INJECTION 76% COMPARISON:  Brain MRI from yesterday FINDINGS: CT HEAD FINDINGS 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 right occipital pole infarct. No hemorrhage, hydrocephalus, or collection. Vascular: Atherosclerotic calcification. Skull: Negative Sinuses: Mild mucosal thickening in the paranasal sinuses. Orbits: Negative Review of the MIP images confirms the above findings CTA NECK FINDINGS Aortic arch: Extensive atherosclerotic and irregular plaque. Three vessel branching. Right carotid system: Diffuse atheromatous wall thickening in the brachiocephalic to the ICA bulb. No flow limiting stenosis or ulceration. Negative for beading or dissection. Left carotid system: Atheromatous plaque causes mild narrowing of the proximal and distal common carotid. Diffusely patent ICA. No beading or dissection. Vertebral arteries: Extensive atherosclerosis along the proximal left subclavian without flow  limiting stenosis. Atherosclerotic irregularity of bilateral V4 segments without flow limiting stenosis. No ulcerated finding. Skeleton: No acute finding. C4-5 ACDF with solid arthrodesis. C5-6 posterior fusion hardware without visible bony fusion. Other neck: Endotracheal and orogastric tubes in place. No acute finding. Upper chest: Emphysema and airway thickening. Small pleural effusions where seen. Review of the MIP images confirms the above findings CTA HEAD FINDINGS Anterior circulation: Mild atherosclerotic plaque on the carotid siphons. No branch occlusion or flow limiting stenosis. Posterior circulation: The left PCA is fetal type; no visible left P1 segment. Dominant left vertebral artery with most of right vertebral flow into the PICA. There is advanced narrowing of the proximal right P2 segment with some downstream flow visible. Venous sinuses: Patent Anatomic variants: As above Delay hip ed phase:  There is enhancement at the left occipital infarct compatible with subacute timing. Review of the MIP images confirms the above findings IMPRESSION: 1. No emergent arterial finding. 2. The small left occipital infarct is enhancing consistent with subacute timing. The left PCA is fetal type. No flow limiting stenosis or definite embolic source seen in the left carotid circulation. 3. Advanced and extensive atherosclerosis with extensive irregular plaque in the visualized aorta. 4. Advanced narrowing of the proximal right PCA. Remote right occipital infarct. 5. Emphysema (ICD10-J43.9). Electronically Signed   By: Monte Fantasia M.D.   On: 01/05/2018 11:58   Ct Chest Wo Contrast  Result Date: 01/05/2018 CLINICAL DATA:  77 y/o  F; acute respiratory illness. EXAM: CT CHEST WITHOUT CONTRAST TECHNIQUE: Multidetector CT imaging of the chest was performed following the standard protocol without IV contrast. COMPARISON:  12/28/2017 CT chest. FINDINGS: Cardiovascular: Right PICC line tip in the right atrium. Dense mitral annular calcification. Mild to moderate coronary artery calcification. Normal heart size. No pericardial effusion. Normal caliber thoracic aorta and main pulmonary artery. Severe calcific atherosclerosis of the thoracic aorta. Mediastinum/Nodes: No enlarged mediastinal or axillary lymph nodes. Thyroid gland, trachea, and esophagus demonstrate no significant findings. Enteric tube tip in the distal esophagus. Stable position of endotracheal tube in mid trachea. Lungs/Pleura: Small bilateral pleural effusions. Dependent consolidation in the right lower lobe. Patchy ground-glass opacities in the left upper lobe. Centrilobular emphysema in the lung apices. Interval improvement in interstitial edema with minimal residual interlobular septal thickening. Upper Abdomen: Small volume of perihepatic ascites. Musculoskeletal: No fracture is seen. IMPRESSION: 1. Increased small bilateral effusions and consolidation  in the dependent right lower lobe which may represent associated atelectasis or pneumonia. 2. Interval improvement in interstitial edema mild residual interlobular septal thickening. 3. Small volume perihepatic ascites. 4. Coronary and aortic calcific atherosclerosis. 5. Enteric tube tip in lower esophagus, advancement recommended. Electronically Signed   By: Kristine Garbe M.D.   On: 01/05/2018 18:52   Dg Abd Portable 1v  Result Date: 01/05/2018 CLINICAL DATA:  Orogastric tube placement EXAM: PORTABLE ABDOMEN - 1 VIEW COMPARISON:  CT 01/08/2018 FINDINGS: Targeted exam for gastric tube position. The tip and side port of a gastric tube are seen below the left hemidiaphragm in the expected location of the proximal stomach. Central line catheter tip terminates at the cavoatrial junction. There is cardiomegaly. Probable small right effusion. IMPRESSION: Tip and side port of a gastric tube are seen within the expected location of the proximal stomach in the left upper quadrant of the abdomen. Electronically Signed   By: Ashley Royalty M.D.   On: 01/05/2018 23:23     ANTIBIOTICS: Zosyn started 4/24 for presumed aspiration.  LINES/TUBES:  DISCUSSION: ASSESSMENT / PLAN:  PULMONARY A: This is a 76 year old who presented with respiratory extremis.  She has a history of COPD.  CT scan of the chest obtained here on 4/25 shows bullous disease and a very dense consolidation in the right lower lobe.  I do not appreciate an air-fluid level in the bullae.  We do not have any positive microbiology, and continuing her empirically on Zosyn for aspiration.  Aspiratory mechanics and gas exchange look adequate this morning and I am anticipating extubation in the near future   CARDIOVASCULAR A: He is having intermittent atrial fibrillation in the setting of a prior stroke and a new stroke I would normally have a very low threshold for initiation of full dose anticoagulation however she appears to be  bruising and bleeding very easily.  She also has a GI mass.  I am awaiting this morning's CBC and guaiac before making decision as to full dose anticoagulation.  Pertinent to the atrial fibrillation there is no valvular pathology normal LV systolic function.  TSH is pending  RENAL A: Creatinine this morning is normal.  I am correcting her hypokalemia.   GASTROINTESTINAL A: Prophylaxis is with Pepcid.  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   HEMATOLOGIC A: Prophylactic  dose heparin is in place.  Pertinent to her diffuse bleeding and anemia coags were normal.  I am at a loss to explain her bruising as we are not on any antiplatelet agents.   INFECTIOUS A: Pneumonia and possible lung abscess.  Current coverage is with Zosyn alone pending culture results should she decline I would empirically add coverage for resistant gram positives  ENDOCRINE A: Sliding scale insulin is in place  NEUROLOGIC A: Distant history of right occipital infarct with new subacute left occipital infarct Beginning statin for secondary prevention, full dose heparin for atrial fibrillation pending establishment of a normal baseline coagulation state.  Greater than 32 minutes was spent in the care of this acutely ill patient with life-threatening illness.   Lars Masson, MD Critical Care Medicine [facility] Pager: [phone]  01/06/2018, 9:12 AM

## 2018-01-06 NOTE — Procedures (Signed)
Endotracheal intubation  Indication: Respiratory distress  Procedure: The patient had been extubated earlier this morning after successfully completing a spontaneous breathing trial.  Following extubation he was not maintaining her airway or clearing her own secretions.  I waited some time to see if this would improve as her earlier sedation was metabolized but the patient appeared to be becoming even more fatigued and I elected to reintubate the patient after discussing the indications with family.  She was preoxygenated with an Ambu bag and then sedated with a combination of 20 mg of etomidate and 40 mg of rocuronium.  The vocal cords were easily visualized with a 3 Miller blade and a 7.5 endotracheal tube was placed to 22 cm at the lip.  There was positive CO2 and symmetric air movement.  Chest x-ray is pending to confirm airway placement

## 2018-01-06 NOTE — Progress Notes (Signed)
E-link MD made aware of patient's blood pressure. New orders received. Will continue to monitor.

## 2018-01-07 ENCOUNTER — Other Ambulatory Visit: Payer: Self-pay

## 2018-01-07 ENCOUNTER — Encounter (HOSPITAL_COMMUNITY): Payer: Self-pay | Admitting: Certified Registered Nurse Anesthetist

## 2018-01-07 LAB — BASIC METABOLIC PANEL
ANION GAP: 7 (ref 5–15)
BUN: 27 mg/dL — ABNORMAL HIGH (ref 6–20)
CHLORIDE: 104 mmol/L (ref 101–111)
CO2: 30 mmol/L (ref 22–32)
CREATININE: 0.69 mg/dL (ref 0.44–1.00)
Calcium: 8.7 mg/dL — ABNORMAL LOW (ref 8.9–10.3)
GFR calc non Af Amer: >60 mL/min (ref >60)
Glucose, Bld: 183 mg/dL — ABNORMAL HIGH (ref 65–99)
Potassium: 5 mmol/L (ref 3.5–5.1)
Sodium: 141 mmol/L (ref 135–145)

## 2018-01-07 LAB — CBC WITH DIFFERENTIAL/PLATELET
BASOS ABS: 0 10*3/uL (ref 0.0–0.1)
BASOS PCT: 0 %
EOS ABS: 0.1 10*3/uL (ref 0.0–0.7)
Eosinophils Relative: 1 %
HCT: 27.3 % — ABNORMAL LOW (ref 36.0–46.0)
HEMOGLOBIN: 8.5 g/dL — AB (ref 12.0–15.0)
Lymphocytes Relative: 6 %
Lymphs Abs: 0.9 10*3/uL (ref 0.7–4.0)
MCH: 29.9 pg (ref 26.0–34.0)
MCHC: 31.1 g/dL (ref 30.0–36.0)
MCV: 96.1 fL (ref 78.0–100.0)
MONOS PCT: 3 %
Monocytes Absolute: 0.4 10*3/uL (ref 0.1–1.0)
NEUTROS ABS: 13.4 10*3/uL — AB (ref 1.7–7.7)
NEUTROS PCT: 90 %
Platelets: 251 10*3/uL (ref 150–400)
RBC: 2.84 MIL/uL — ABNORMAL LOW (ref 3.87–5.11)
RDW: 16.4 % — ABNORMAL HIGH (ref 11.5–15.5)
WBC: 14.8 10*3/uL — ABNORMAL HIGH (ref 4.0–10.5)

## 2018-01-07 LAB — POCT I-STAT 3, ART BLOOD GAS (G3+)
ACID-BASE EXCESS: 9 mmol/L — AB (ref 0.0–2.0)
Bicarbonate: 34 mmol/L — ABNORMAL HIGH (ref 20.0–28.0)
O2 Saturation: 97 %
PCO2 ART: 50.1 mmHg — AB (ref 32.0–48.0)
PH ART: 7.44 (ref 7.350–7.450)
PO2 ART: 91 mmHg (ref 83.0–108.0)
TCO2: 35 mmol/L — ABNORMAL HIGH (ref 22–32)

## 2018-01-07 LAB — CULTURE, RESPIRATORY W GRAM STAIN

## 2018-01-07 LAB — POTASSIUM: POTASSIUM: 5.1 mmol/L (ref 3.5–5.1)

## 2018-01-07 LAB — GLUCOSE, CAPILLARY
GLUCOSE-CAPILLARY: 140 mg/dL — AB (ref 65–99)
GLUCOSE-CAPILLARY: 160 mg/dL — AB (ref 65–99)
GLUCOSE-CAPILLARY: 182 mg/dL — AB (ref 65–99)
Glucose-Capillary: 153 mg/dL — ABNORMAL HIGH (ref 65–99)
Glucose-Capillary: 165 mg/dL — ABNORMAL HIGH (ref 65–99)
Glucose-Capillary: 166 mg/dL — ABNORMAL HIGH (ref 65–99)
Glucose-Capillary: 176 mg/dL — ABNORMAL HIGH (ref 65–99)

## 2018-01-07 LAB — CULTURE, RESPIRATORY: CULTURE: NORMAL

## 2018-01-07 LAB — PROCALCITONIN
Procalcitonin: 0.41 ng/mL
Procalcitonin: 0.39 ng/mL

## 2018-01-07 LAB — MAGNESIUM: MAGNESIUM: 1.5 mg/dL — AB (ref 1.7–2.4)

## 2018-01-07 LAB — TSH: TSH: 2.701 u[IU]/mL (ref 0.350–4.500)

## 2018-01-07 MED ORDER — VANCOMYCIN 50 MG/ML ORAL SOLUTION
125.0000 mg | Freq: Four times a day (QID) | ORAL | Status: DC
  Administered 2018-01-07 – 2018-01-13 (×25): 125 mg via ORAL
  Filled 2018-01-07 (×27): qty 2.5

## 2018-01-07 MED ORDER — ATORVASTATIN CALCIUM 40 MG PO TABS
40.0000 mg | ORAL_TABLET | Freq: Every day | ORAL | Status: DC
  Administered 2018-01-07 – 2018-01-12 (×6): 40 mg via ORAL
  Filled 2018-01-07 (×6): qty 1

## 2018-01-07 MED ORDER — DIGOXIN 0.25 MG/ML IJ SOLN
0.2500 mg | Freq: Four times a day (QID) | INTRAMUSCULAR | Status: AC
  Administered 2018-01-07 (×3): 0.25 mg via INTRAVENOUS
  Filled 2018-01-07 (×3): qty 2

## 2018-01-07 MED ORDER — CLOPIDOGREL BISULFATE 75 MG PO TABS: 75.0000 mg | ORAL_TABLET | Freq: Every day | ORAL | Status: DC

## 2018-01-07 NOTE — Progress Notes (Signed)
PULMONARY / CRITICAL CARE MEDICINE   Name: Melinda Schroeder MRN: [id number] DOB: 06-03-1941    ADMISSION DATE:  12/25/2017  CHIEF COMPLAINT:  Respiratory distress, CVA, question of seizure  HISTORY OF PRESENT ILLNESS:   77 year old female who presented to [facility] 4/17 via EMS with reports of respiratory distress.  She carries a medical history of CAD, HTN, HLD, GERD, diverticulitis, esophageal dysmotility, prior esophageal dilatation, tobacco abuse, COPD.  On 4/4 she had an upper endoscopy by Dr. 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  Per prior documentation, EMS was called for respiratory distress.  Upon EMS arrival she was in such extremis that she was tripoding.  The patient vomited with concern for aspiration.  She had been having significant reflux in the days prior.  They administered albuterol at which point she reportedly became agonal with questionable seizure activity.  EMS administered Versed for possible seizure.  The family reported that the patient complained of wheezing on 4/16.  She has been seen by her primary care provider on Monday 4/15 with a reported unremarkable visit.  The patient was intubated upon arrival to the emergency room (1 attempt documented, MAC 4 blade).  Multiple attempts at central venous access placement noted including right femoral left IJ and right IJ without success.  She ultimately had a PICC line placed in the right upper extremity.  In addition she underwent lumbar puncture in the setting of fever to 104 with reportedly clear CSF removed.  Chest x-ray post ET tube placement showed hyperinflation per radiology read with no active disease and ET tube 8 cm above the carina.  CT of the head without contrast showed an old right occipital infarct, no acute intracranial abnormality.  CT angiogram of the  chest demonstrated no PE, mild interstitial edema, focal atelectasis in the right lower lobe with question of an infected bleb in the right lower lobe, aortic atherosclerosis and emphysema, left ventricular hypertrophy.  Patient initially required levophed for hypotension.  She was given multiple fluid boluses.  Lactic acid cleared.  Notes reflect that the patient had agitated delirium upon wake up assessments.  She was treated with IV steroids and nebulized bronchodilators.  She had ongoing agitation and altered mental status (not clear what sedation pt was on at the time) and repeat CT imaging of the head was obtained on 4/20 which showed an interval development of acute early subacute small left PCA territory infarct involving the left occipital pole.  No associated hemorrhage or mass-effect.  This was new from the prior study on 4/17.  Chronic right PCA territory infarct stable. 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 Notes from 4/20 raise concern of possible aspiration PNA.  Daughter-in-law reports NGT was displaced and the patient was gurgling on the vent / TF held.  Additionally, she had documented atrial fibrillation treated with cardizem. She was seen by Tele-Neuro.  Decision was made for transfer to [facility] for further evaluation 4/24.  She is deeply sedated on a combination of propofol and fentanyl this morning and is not at all interactive.  She has been intermittently in atrial fibrillation.  I asked her husband whether there are risks involved  with type coagulation, specifically whether her GI problems were diagnosed after a GI bleed.  He is not aware that she has had any blood loss from her GI tract.  He does report that she seems to neck and bleed easily and that when they withdrew a line from her on at the previous hospital she had prolonged blood loss.  MRI 4/25 has shown a new acute left occipital infarct. Afternoon of 4/25 she was having intermittent atrial fibrillation for which she is now on Cardizem.  She is subsequently reverted to normal sinus rhythm.   She failed extubation yesterday largely due to an inability to protect her airway.  She is sedated on Precedex this morning and not at all interactive.  She is in atrial fibrillation with a rapid ventricular response.   PAST MEDICAL HISTORY :  She  has a past medical history of Anginal pain ([place]), Anxiety, Bronchitis, allergic, COPD (chronic obstructive pulmonary disease) ([place]), Coronary artery disease, DDD (degenerative disc disease), Diverticulitis, Esophageal dysmotilities, Gastritis, GERD (gastroesophageal reflux disease), Hypercholesterolemia, Hypertension, IBS (irritable bowel syndrome), Restless legs, Sepsis ([place]) (02/2016), and Shortness of breath dyspnea.  PAST SURGICAL HISTORY: She  has a past surgical history that includes Bladder surgery; breast mass right excision; Hernia repair; Coronary stent placement; Appendectomy; shoulder sugery (Left); Anterior cervical decomp/discectomy fusion (N/A, 06/08/2013); Abdominal hysterectomy; EUS (N/A, 10/17/2014); EUS (N/A, 12/04/2015); Laminectomy (Left, 02/11/2016); and EUS (N/A, 12/15/2017).  Allergies  Allergen Reactions  . Ace Inhibitors Cough  . Sulfa Antibiotics Nausea Only    No current facility-administered medications on file prior to encounter.    Current Outpatient Medications on File Prior to Encounter  Medication Sig  . albuterol (PROVENTIL HFA;VENTOLIN HFA) 108 (90 Base) MCG/ACT inhaler Inhale 2 puffs into the lungs  every 6 (six) hours as needed for wheezing or shortness of breath.  . ALPRAZolam (XANAX) 0.25 MG tablet Take 0.25 mg by mouth 3 (three) times daily as needed for anxiety.  [name] aspirin 81 MG chewable tablet Chew 1 tablet (81 mg total) by mouth daily.  . cyanocobalamin (,VITAMIN B-12,) 1000 MCG/ML injection Inject 1,000 mcg into the muscle every 30 (thirty) days.  [name] HYDROcodone-acetaminophen (NORCO) 10-325 MG tablet Take 1 tablet by mouth 3 (three) times daily as needed for severe pain.  [name] losartan (COZAAR) 100 MG tablet Take 100 mg by mouth daily.  . magnesium oxide (MAG-OX) 400 MG tablet Take 400 mg by mouth daily.  . metoprolol succinate (TOPROL-XL) 100 MG 24 hr tablet Take 100 mg by mouth 2 (two) times daily. Take with or immediately following a meal.  . mirtazapine (REMERON) 15 MG tablet Take 15 mg by mouth at bedtime.  . nitroGLYCERIN (NITROSTAT) 0.4 MG SL tablet Place 0.4 mg under the tongue every 5 (five) minutes as needed for chest pain.  . Nutritional Supplements (ESTROVEN PO) Take 1 tablet by mouth at bedtime.  [name] omeprazole (PRILOSEC) 20 MG capsule Take 20 mg by mouth 2 (two) times daily before a meal.   . PARoxetine (PAXIL) 20 MG tablet Take 20 mg by mouth every morning.   . potassium chloride (K-DUR,KLOR-CON) 10 MEQ tablet Take 10 mEq by mouth 2 (two) times daily.  . promethazine (PHENERGAN) 25 MG tablet Take 25 mg by mouth every 4 (four) hours as  needed for nausea or vomiting.  . simvastatin (ZOCOR) 40 MG tablet Take 40 mg by mouth at bedtime.     FAMILY HISTORY:  Her indicated that the status of her mother is unknown. She indicated that the status of her father is unknown. She indicated that the status of her brother is unknown.   SOCIAL HISTORY: She  reports that she has been smoking cigarettes.  She has a 50.00 pack-year smoking history. She has never used smokeless tobacco. She reports that she does not drink alcohol or use drugs.  REVIEW OF SYSTEMS:    Unobtainable  SUBJECTIVE:  Unobtainable  VITAL SIGNS: BP 117/80   Pulse (!) 138   Temp 98.3 F (36.8 C) (Axillary)   Resp (!) 25   Ht _0  (1.549 m)   Wt 150 lb 12.7 oz (68.4 kg)   SpO2 97%   BMI 28.49 kg/m   HEMODYNAMICS:    VENTILATOR SETTINGS: Vent Mode: PRVC FiO2 (%):  [30 %-50 %] 30 % Set Rate:  [16 bmp] 16 bmp Vt Set:  [450 mL] 450 mL PEEP:  [5 cmH20] 5 cmH20 Plateau Pressure:  [16 cmH20-22 cmH20] 17 cmH20  INTAKE / OUTPUT: I/O last 3 completed shifts: In: 3742.8 [I.V.:2535.4; NG/GT:957.3; IV Piggyback:250] Out: 2885 [Urine:2885]  PHYSICAL EXAMINATION: General: Elderly and chronically ill-appearing, orally intubated and mechanically ventilated.   Neuro: Pupils equal and reactive, no response to voice or sternal rub Cardiovascular: 1 and S2 are rapid and irregularly irregular without murmur rub or gallop  Lungs: Respirations are slightly labored despite being on assist control ventilation.  There is symmetric air movement, no wheezes and lots of scattered rhonchi.   Abdomen: Abdomen is obese and soft without any organomegaly masses or tenderness. Skin: Again is extraordinarily friable   She has diffuse scattered ecchymoses.  LABS:  BMET Recent Labs  Lab 12/16/2017 1523 01/05/18 0412 01/05/18 1221 01/07/18 0510  NA 135 136 140 141  K 2.9* 3.3* 2.8* 5.0  CL 93* 94* 107 104  CO2 28 30  --  30  BUN 20 27* 16 27*  CREATININE 0.86 0.83 0.40* 0.69  GLUCOSE 210* 189* 120* 183*    Electrolytes Recent Labs  Lab 01/03/2018 1523 01/05/18 0412 01/07/18 0510  CALCIUM 7.9* 8.0* 8.7*  MG  --  1.8 1.5*  PHOS  --  2.7  --     CBC Recent Labs  Lab 01/05/18 0412 01/05/18 1221 01/06/18 0944 01/07/18 0510  WBC 18.5*  --  18.8* 14.8*  HGB 9.5* 6.5* 9.7* 8.5*  HCT 29.6* 19.0* 31.5* 27.3*  PLT 296  --  316 251    Coag's Recent Labs  Lab 01/05/18 1200  APTT 24  INR 1.17    Sepsis Markers Recent Labs  Lab 12/13/2017 1523 01/06/18 0944  01/07/18 0510  PROCALCITON 0.33 0.29 0.41    ABG Recent Labs  Lab 01/05/18 0330 01/06/18 0911 01/06/18 1323  PHART 7.423 7.351 7.366  PCO2ART 50.8* 56.2* 53.7*  PO2ART 104 78.0* 150.0*    Liver Enzymes Recent Labs  Lab 12/26/2017 1523  AST 17  ALT 22  ALKPHOS 45  BILITOT 0.9  ALBUMIN 2.9*    Cardiac Enzymes No results for input(s): TROPONINI, PROBNP in the last 168 hours.  Glucose Recent Labs  Lab 01/06/18 1127 01/06/18 1516 01/06/18 2022 01/07/18 0012 01/07/18 0430 01/07/18 0717  GLUCAP 234* 128* 142* 166* 176* 153*    Imaging Dg Chest Port 1 View  Result Date: 01/06/2018 CLINICAL DATA:  Status post endotracheal tube placement. EXAM: PORTABLE CHEST 1 VIEW COMPARISON:  Radiograph of January 05, 2018. FINDINGS: Endotracheal and nasogastric tubes are in grossly good position. Right-sided PICC line is noted with distal tip in expected position of the SVC. No pneumothorax is noted. Minimal bibasilar subsegmental atelectasis and minimal pleural effusions are noted. Bony thorax is unremarkable. IMPRESSION: Stable support apparatus. Minimal bibasilar subsegmental atelectasis is noted with minimal pleural effusions. Electronically Signed   By: Marijo Conception, M.D.   On: 01/06/2018 11:29   Dg Abd Portable 1v  Result Date: 01/06/2018 CLINICAL DATA:  Post tube placement. EXAM: PORTABLE ABDOMEN - 1 VIEW COMPARISON:  01/05/2018. FINDINGS: Orogastric tube tip lies within the stomach lying along the distal aspect of the greater curvature. Bowel gas pattern appears nonspecific. IMPRESSION: Orogastric tube in the stomach. Electronically Signed   By: Staci Righter M.D.   On: 01/06/2018 11:43     ANTIBIOTICS: Zosyn started 4/24 for presumed aspiration.  LINES/TUBES:        DISCUSSION: ASSESSMENT / PLAN:  PULMONARY A: This is a 77 year old who presented with respiratory extremis.  She has a history of COPD.  CT scan of the chest obtained here on 4/25 shows bullous disease and  a very dense consolidation in the right lower lobe.  I do not appreciate an air-fluid level in the bullae.  We do not have any positive microbiology, and continuing her empirically on Zosyn for aspiration.  I am not contemplating transferring any work of breathing until her arrhythmia is controlled.    CARDIOVASCULAR A: She is clearly in atrial fibrillation with a rapid ventricular response this morning despite being on 15 mg/h of diltiazem.  He is not a candidate for anticoagulation due to guaiac positive stools.  I am adding dig for rate control.  RENAL A: Creatinine this morning is normal.     GASTROINTESTINAL A: Prophylaxis is with Pepcid.  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     HEMATOLOGIC A: Prophylactic  dose heparin is in place.  Pertinent to her diffuse bleeding and anemia coags were normal.  I am at a loss to explain her bruising as we are not on any antiplatelet agents.   INFECTIOUS A: Pneumonia.  CT scan on 4/26 shows a very dense dependent infiltrate in the Right lower lobe.  I am going to continue her on empiric Zosyn today and if white count and procalcitonin have not cleared by the morning we will arrange for a bronchoscopy.  She is having liquid stool but I was misinformed at the time that his C. difficile toxin assay was ordered.  At that time she was receiving both Colace and tube feedings.  C. difficile PCR shows that she is carrying a toxigenic strain of C. difficile but with little toxin production.  I suspect that active C. difficile is not an issue here however I think it would be prudent to put her on enteral vancomycin so long as she is on broad-spectrum systemic antibiotics.  ENDOCRINE A: Sliding scale insulin is in place with only marginal glycemic control.  I have a dose of long-acting insulin  NEUROLOGIC A: Distant history of right occipital infarct with new subacute left occipital infarct Beginning statin for  secondary prevention, full dose heparin for atrial fibrillation not be initiated due to her guaiac positive stool.    Greater than 32 minutes was spent in the care of this acutely ill patient with life-threatening  illness.   Lars Masson, MD Critical Care Medicine [facility] Pager: [phone]  01/07/2018, 8:49 AM

## 2018-01-07 NOTE — Progress Notes (Signed)
Subjective: 77 year old female presented with shortness of breath respiratory failure, altered mental status now re intubated  Objective: Current vital signs: BP 130/85   Pulse (!) 155   Temp 98.3 F (36.8 C) (Axillary)   Resp (!) 23   Ht '5\' 1"'$  (1.549 m)   Wt 68.4 kg (150 lb 12.7 oz)   SpO2 98%   BMI 28.49 kg/m  Vital signs in last 24 hours: Temp:  [96.9 F (36.1 C)-98.3 F (36.8 C)] 98.3 F (36.8 C) (04/27 0800) Pulse Rate:  [45-165] 155 (04/27 0926) Resp:  [0-30] 23 (04/27 0845) BP: (77-154)/(35-117) 130/85 (04/27 0926) SpO2:  [94 %-100 %] 98 % (04/27 0845) Arterial Line BP: (82-208)/(37-119) 152/90 (04/27 0845) FiO2 (%):  [30 %-50 %] 30 % (04/27 0714) Weight:  [68.4 kg (150 lb 12.7 oz)] 68.4 kg (150 lb 12.7 oz) (04/26 1600)  Intake/Output from previous day: 04/26 0701 - 04/27 0700 In: 2193.1 [I.V.:1394.5; NG/GT:598.7; IV Piggyback:200] Out: 2120 [Urine:2120] Intake/Output this shift: Total I/O In: 344 [I.V.:224; NG/GT:120] Out: 275 [Urine:275] Nutritional status: Diet NPO time specified       Neurologic Exam:   Lab Results: Basic Metabolic Panel: Recent Labs  Lab 12/13/2017 1523 01/05/18 0412 01/05/18 1221 01/07/18 0510  NA 135 136 140 141  K 2.9* 3.3* 2.8* 5.0  CL 93* 94* 107 104  CO2 28 30  --  30  GLUCOSE 210* 189* 120* 183*  BUN 20 27* 16 27*  CREATININE 0.86 0.83 0.40* 0.69  CALCIUM 7.9* 8.0*  --  8.7*  MG  --  1.8  --  1.5*  PHOS  --  2.7  --   --     Liver Function Tests: Recent Labs  Lab 01/06/2018 1523  AST 17  ALT 22  ALKPHOS 45  BILITOT 0.9  PROT 5.5*  ALBUMIN 2.9*   No results for input(s): AMMONIA in the last 168 hours.  CBC: Recent Labs  Lab 12/28/2017 1200 01/05/18 0412 01/05/18 1221 01/06/18 0944 01/07/18 0510  WBC 13.6* 18.5*  --  18.8* 14.8*  NEUTROABS  --   --   --  16.4* 13.4*  HGB 10.5* 9.5* 6.5* 9.7* 8.5*  HCT 31.4* 29.6* 19.0* 31.5* 27.3*  MCV 88.0 90.5  --  94.6 96.1  PLT 290 296  --  316 251       Lipid Panel: Recent Labs  Lab 12/22/2017 1129 01/05/18 0412  CHOL  --  200  TRIG 154* 181*  HDL  --  42  CHOLHDL  --  4.8  VLDL  --  36  LDLCALC  --  122*    CBG: Recent Labs  Lab 01/06/18 1516 01/06/18 2022 01/07/18 0012 01/07/18 0430 01/07/18 0717  GLUCAP 128* 142* 166* 176* 153*      Imaging: Ct Chest Wo Contrast  Result Date: 01/05/2018 CLINICAL DATA:  77 y/o  F; acute respiratory illness. EXAM: CT CHEST WITHOUT CONTRAST TECHNIQUE: Multidetector CT imaging of the chest was performed following the standard protocol without IV contrast. COMPARISON:  12/28/2017 CT chest. FINDINGS: Cardiovascular: Right PICC line tip in the right atrium. Dense mitral annular calcification. Mild to moderate coronary artery calcification. Normal heart size. No pericardial effusion. Normal caliber thoracic aorta and main pulmonary artery. Severe calcific atherosclerosis of the thoracic aorta. Mediastinum/Nodes: No enlarged mediastinal or axillary lymph nodes. Thyroid gland, trachea, and esophagus demonstrate no significant findings. Enteric tube tip in the distal esophagus. Stable position of endotracheal tube in mid trachea. Lungs/Pleura: Small bilateral pleural  effusions. Dependent consolidation in the right lower lobe. Patchy ground-glass opacities in the left upper lobe. Centrilobular emphysema in the lung apices. Interval improvement in interstitial edema with minimal residual interlobular septal thickening. Upper Abdomen: Small volume of perihepatic ascites. Musculoskeletal: No fracture is seen. IMPRESSION: 1. Increased small bilateral effusions and consolidation in the dependent right lower lobe which may represent associated atelectasis or pneumonia. 2. Interval improvement in interstitial edema mild residual interlobular septal thickening. 3. Small volume perihepatic ascites. 4. Coronary and aortic calcific atherosclerosis. 5. Enteric tube tip in lower esophagus, advancement recommended.  Electronically Signed   By: Kristine Garbe M.D.   On: 01/05/2018 18:52   Dg Chest Port 1 View  Result Date: 01/06/2018 CLINICAL DATA:  Status post endotracheal tube placement. EXAM: PORTABLE CHEST 1 VIEW COMPARISON:  Radiograph of January 05, 2018. FINDINGS: Endotracheal and nasogastric tubes are in grossly good position. Right-sided PICC line is noted with distal tip in expected position of the SVC. No pneumothorax is noted. Minimal bibasilar subsegmental atelectasis and minimal pleural effusions are noted. Bony thorax is unremarkable. IMPRESSION: Stable support apparatus. Minimal bibasilar subsegmental atelectasis is noted with minimal pleural effusions. Electronically Signed   By: Marijo Conception, M.D.   On: 01/06/2018 11:29   Dg Abd Portable 1v  Result Date: 01/06/2018 CLINICAL DATA:  Post tube placement. EXAM: PORTABLE ABDOMEN - 1 VIEW COMPARISON:  01/05/2018. FINDINGS: Orogastric tube tip lies within the stomach lying along the distal aspect of the greater curvature. Bowel gas pattern appears nonspecific. IMPRESSION: Orogastric tube in the stomach. Electronically Signed   By: Staci Righter M.D.   On: 01/06/2018 11:43   Dg Abd Portable 1v  Result Date: 01/05/2018 CLINICAL DATA:  Orogastric tube placement EXAM: PORTABLE ABDOMEN - 1 VIEW COMPARISON:  CT 01/08/2018 FINDINGS: Targeted exam for gastric tube position. The tip and side port of a gastric tube are seen below the left hemidiaphragm in the expected location of the proximal stomach. Central line catheter tip terminates at the cavoatrial junction. There is cardiomegaly. Probable small right effusion. IMPRESSION: Tip and side port of a gastric tube are seen within the expected location of the proximal stomach in the left upper quadrant of the abdomen. Electronically Signed   By: Ashley Royalty M.D.   On: 01/05/2018 23:23    Medications:  Scheduled: . ascorbic acid  500 mg Per Tube BID  . aspirin  325 mg Oral Daily  . chlorhexidine  gluconate (MEDLINE KIT)  15 mL Mouth Rinse BID  . Chlorhexidine Gluconate Cloth  6 each Topical Q0600  . digoxin  0.25 mg Intravenous Q6H  . famotidine  20 mg Per Tube BID  . feeding supplement (PRO-STAT SUGAR FREE 64)  30 mL Per Tube Daily  . feeding supplement (VITAL HIGH PROTEIN)  1,000 mL Per Tube Q24H  . fentaNYL (SUBLIMAZE) injection  100 mcg Intravenous Once  . guaiFENesin  15 mL Per Tube BID  . heparin injection (subcutaneous)  5,000 Units Subcutaneous Q8H  . insulin aspart  0-9 Units Subcutaneous Q4H  . ipratropium-albuterol  3 mL Nebulization Q6H  . mouth rinse  15 mL Mouth Rinse 10 times per day  . metoprolol tartrate  50 mg Oral BID  . multivitamin  15 mL Per Tube Daily  . mupirocin ointment  1 application Nasal BID  . nicotine  14 mg Transdermal Daily  . PARoxetine  20 mg Oral q morning - 10a  . simvastatin  40 mg Oral QHS  . vancomycin  125 mg Oral QID    Assessment/Plan: 77 year old female with history of hypertension hyperlipidemia coronary artery disease COPD tobacco dependence initially presented with respiratory failure and altered mental status. Patient is re intubated  Neurological work-up MRI brain ischemic stroke in the left occipital region MRA head and neck showed moderate to high-grade stenosis of right posterior cerebral artery and right vertebral artery, moderate stenosis of right ICA and signs of small vessel disease.  EEG shows background slowing.  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 Current neurological exam is likely suggestive of a combination of effect of her medical issues pneumonia COPD and stroke. Findings were discussed in detail with primary team and the family. At this point continue aspirin and statin therapy for stroke  prevention. Continue medical management. Please feel free to call neurology with any questions or concerns.          01/07/2018, 10:38 AM

## 2018-01-08 ENCOUNTER — Inpatient Hospital Stay (HOSPITAL_COMMUNITY): Payer: Medicare HMO

## 2018-01-08 DIAGNOSIS — J9601 Acute respiratory failure with hypoxia: Secondary | ICD-10-CM

## 2018-01-08 DIAGNOSIS — J189 Pneumonia, unspecified organism: Secondary | ICD-10-CM

## 2018-01-08 LAB — GLUCOSE, CAPILLARY
GLUCOSE-CAPILLARY: 159 mg/dL — AB (ref 65–99)
GLUCOSE-CAPILLARY: 159 mg/dL — AB (ref 65–99)
GLUCOSE-CAPILLARY: 163 mg/dL — AB (ref 65–99)
GLUCOSE-CAPILLARY: 164 mg/dL — AB (ref 65–99)
Glucose-Capillary: 156 mg/dL — ABNORMAL HIGH (ref 65–99)
Glucose-Capillary: 146 mg/dL — ABNORMAL HIGH (ref 65–99)

## 2018-01-08 LAB — PROCALCITONIN: PROCALCITONIN: 0.51 ng/mL

## 2018-01-08 LAB — BASIC METABOLIC PANEL
ANION GAP: 10 (ref 5–15)
BUN: 29 mg/dL — ABNORMAL HIGH (ref 6–20)
CALCIUM: 9.5 mg/dL (ref 8.9–10.3)
CHLORIDE: 98 mmol/L — AB (ref 101–111)
CO2: 31 mmol/L (ref 22–32)
CREATININE: 0.76 mg/dL (ref 0.44–1.00)
GFR calc non Af Amer: >60 mL/min (ref >60)
Glucose, Bld: 169 mg/dL — ABNORMAL HIGH (ref 65–99)
Potassium: 4.6 mmol/L (ref 3.5–5.1)
SODIUM: 139 mmol/L (ref 135–145)

## 2018-01-08 LAB — CBC WITH DIFFERENTIAL/PLATELET
BASOS ABS: 0 10*3/uL (ref 0.0–0.1)
Basophils Relative: 0 %
EOS ABS: 0 10*3/uL (ref 0.0–0.7)
Eosinophils Relative: 0 %
HCT: 27.1 % — ABNORMAL LOW (ref 36.0–46.0)
Hemoglobin: 8.4 g/dL — ABNORMAL LOW (ref 12.0–15.0)
LYMPHS PCT: 6 %
Lymphs Abs: 1.1 10*3/uL (ref 0.7–4.0)
MCH: 29.3 pg (ref 26.0–34.0)
MCHC: 31 g/dL (ref 30.0–36.0)
MCV: 94.4 fL (ref 78.0–100.0)
MONOS PCT: 3 %
Monocytes Absolute: 0.5 10*3/uL (ref 0.1–1.0)
NEUTROS ABS: 16.7 10*3/uL — AB (ref 1.7–7.7)
NEUTROS PCT: 91 %
Platelets: 263 10*3/uL (ref 150–400)
RBC: 2.87 MIL/uL — AB (ref 3.87–5.11)
RDW: 15.8 % — ABNORMAL HIGH (ref 11.5–15.5)
WBC: 18.3 10*3/uL — AB (ref 4.0–10.5)

## 2018-01-08 LAB — POCT I-STAT 3, ART BLOOD GAS (G3+)
Acid-Base Excess: 7 mmol/L — ABNORMAL HIGH (ref 0.0–2.0)
Bicarbonate: 31.3 mmol/L — ABNORMAL HIGH (ref 20.0–28.0)
O2 SAT: 97 %
PH ART: 7.483 — AB (ref 7.350–7.450)
TCO2: 33 mmol/L — ABNORMAL HIGH (ref 22–32)
pCO2 arterial: 41.8 mmHg (ref 32.0–48.0)
pO2, Arterial: 84 mmHg (ref 83.0–108.0)

## 2018-01-08 LAB — MAGNESIUM: MAGNESIUM: 1.6 mg/dL — AB (ref 1.7–2.4)

## 2018-01-08 MED ORDER — NOREPINEPHRINE BITARTRATE 1 MG/ML IV SOLN
0.0000 ug/min | INTRAVENOUS | Status: DC
  Filled 2018-01-08: qty 4

## 2018-01-08 MED ORDER — DEXMEDETOMIDINE HCL IN NACL 400 MCG/100ML IV SOLN
0.4000 ug/kg/h | INTRAVENOUS | Status: DC
  Administered 2018-01-08: 0.6 ug/kg/h via INTRAVENOUS
  Administered 2018-01-08: 0.5 ug/kg/h via INTRAVENOUS
  Administered 2018-01-09: 0.4 ug/kg/h via INTRAVENOUS
  Administered 2018-01-10 – 2018-01-11 (×2): 0.3 ug/kg/h via INTRAVENOUS
  Filled 2018-01-08 (×5): qty 100

## 2018-01-08 MED ORDER — CHLORHEXIDINE GLUCONATE CLOTH 2 % EX PADS
6.0000 | MEDICATED_PAD | Freq: Every day | CUTANEOUS | Status: DC
  Administered 2018-01-08 – 2018-01-11 (×3): 6 via TOPICAL

## 2018-01-08 MED ORDER — DIGOXIN 0.25 MG/ML IJ SOLN: 0.1250 mg | Freq: Every day | INTRAMUSCULAR | Status: DC

## 2018-01-08 MED ORDER — VITAL HIGH PROTEIN PO LIQD
1000.0000 mL | ORAL | Status: DC
  Administered 2018-01-08 – 2018-01-10 (×3): 1000 mL
  Filled 2018-01-08 (×3): qty 1000

## 2018-01-08 NOTE — Procedures (Signed)
Bedside Bronchoscopy Procedure Note KYOKO ELSEA [id number] July 05, 1941  Procedure: Bronchoscopy Indications: Diagnostic evaluation of the airways  Procedure Details: ET Tube Size: ET Tube secured at lip (cm): Bite block in place: No In preparation for procedure, Patient hyper-oxygenated with 100 % FiO2 Airway entered and the following bronchi were examined: RUL, RML, RLL, LUL and LLL.   Bronchoscope removed.    Evaluation BP (!) 156/56   Pulse 82   Temp 99.4 F (37.4 C) (Oral)   Resp 14   Ht 5\' 1"  (1.549 m)   Wt 126 lb 8.7 oz (57.4 kg)   SpO2 100%   BMI 23.91 kg/m  Breath Sounds:Rhonch O2 sats: stable throughout Patient's Current Condition: stable Specimens:  Sent purulent fluid Complications: No apparent complications Patient did tolerate procedure well.   Mcneil Sober 01/08/2018, 3:05 PM

## 2018-01-08 NOTE — Procedures (Signed)
Bronchoscopy Procedure Note WARREN KUGELMAN [id number] 03/07/1941  Procedure: Bronchoscopy Indications: Obtain specimens for culture and/or other diagnostic studies and Remove secretions  Procedure Details Consent: Risks of procedure as well as the alternatives and risks of each were explained to the (patient/caregiver).  Consent for procedure obtained. Time Out: Verified patient identification, verified procedure, site/side was marked, verified correct patient position, special equipment/implants available, medications/allergies/relevent history reviewed, required imaging and test results available.  Performed  In preparation for procedure, patient was given 100% FiO2 and bronchoscope lubricated. Sedation: Etomidate  Airway entered and the following bronchi were examined: RUL, RML, RLL, LUL, LLL and Bronchi.   BAL performed at the RLL primarily where most purulence was coming from.  Sample sent for culture and cytology. Bronchoscope removed.  , Patient placed back on 100% FiO2 at conclusion of procedure.    Evaluation Hemodynamic Status: BP stable throughout; O2 sats: stable throughout Patient's Current Condition: stable Specimens:  Sent purulent fluid Complications: No apparent complications Patient did tolerate procedure well.   Jennet Maduro 01/08/2018

## 2018-01-08 NOTE — Progress Notes (Signed)
PULMONARY / CRITICAL CARE MEDICINE   Name: Melinda Schroeder MRN: [id number] DOB: 06-03-1941    ADMISSION DATE:  12/25/2017  CHIEF COMPLAINT:  Respiratory distress, CVA, question of seizure  HISTORY OF PRESENT ILLNESS:   77 year old female who presented to [facility] 4/17 via EMS with reports of respiratory distress.  She carries a medical history of CAD, HTN, HLD, GERD, diverticulitis, esophageal dysmotility, prior esophageal dilatation, tobacco abuse, COPD.  On 4/4 she had an upper endoscopy by Dr. 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  Per prior documentation, EMS was called for respiratory distress.  Upon EMS arrival she was in such extremis that she was tripoding.  The patient vomited with concern for aspiration.  She had been having significant reflux in the days prior.  They administered albuterol at which point she reportedly became agonal with questionable seizure activity.  EMS administered Versed for possible seizure.  The family reported that the patient complained of wheezing on 4/16.  She has been seen by her primary care provider on Monday 4/15 with a reported unremarkable visit.  The patient was intubated upon arrival to the emergency room (1 attempt documented, MAC 4 blade).  Multiple attempts at central venous access placement noted including right femoral left IJ and right IJ without success.  She ultimately had a PICC line placed in the right upper extremity.  In addition she underwent lumbar puncture in the setting of fever to 104 with reportedly clear CSF removed.  Chest x-ray post ET tube placement showed hyperinflation per radiology read with no active disease and ET tube 8 cm above the carina.  CT of the head without contrast showed an old right occipital infarct, no acute intracranial abnormality.  CT angiogram of the  chest demonstrated no PE, mild interstitial edema, focal atelectasis in the right lower lobe with question of an infected bleb in the right lower lobe, aortic atherosclerosis and emphysema, left ventricular hypertrophy.  Patient initially required levophed for hypotension.  She was given multiple fluid boluses.  Lactic acid cleared.  Notes reflect that the patient had agitated delirium upon wake up assessments.  She was treated with IV steroids and nebulized bronchodilators.  She had ongoing agitation and altered mental status (not clear what sedation pt was on at the time) and repeat CT imaging of the head was obtained on 4/20 which showed an interval development of acute early subacute small left PCA territory infarct involving the left occipital pole.  No associated hemorrhage or mass-effect.  This was new from the prior study on 4/17.  Chronic right PCA territory infarct stable. 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 Notes from 4/20 raise concern of possible aspiration PNA.  Daughter-in-law reports NGT was displaced and the patient was gurgling on the vent / TF held.  Additionally, she had documented atrial fibrillation treated with cardizem. She was seen by Tele-Neuro.  Decision was made for transfer to [facility] for further evaluation 4/24.  She is deeply sedated on a combination of propofol and fentanyl this morning and is not at all interactive.  She has been intermittently in atrial fibrillation.  I asked her husband whether there are risks involved  with type coagulation, specifically whether her GI problems were diagnosed after a GI bleed.  He is not aware that she has had any blood loss from her GI tract.  He does report that she seems to neck and bleed easily and that when they withdrew a line from her on at the previous hospital she had prolonged blood loss.  MRI 4/25 has shown a new acute left occipital infarct. Afternoon of 4/25 she was having intermittent atrial fibrillation for which she is now on Cardizem.  She is subsequently reverted to normal sinus rhythm.   She failed extubation 4/26 largely due to an inability to protect her airway.  He continues to show hemodynamic lability either being tachycardic and hypertensive for rate controlled and hypotensive depending upon her level of sedation.  She is not at all interactive during my examination.  She has less liquid stool today.   PAST MEDICAL HISTORY :  She  has a past medical history of Anginal pain ([place]), Anxiety, Bronchitis, allergic, COPD (chronic obstructive pulmonary disease) ([place]), Coronary artery disease, DDD (degenerative disc disease), Diverticulitis, Esophageal dysmotilities, Gastritis, GERD (gastroesophageal reflux disease), Hypercholesterolemia, Hypertension, IBS (irritable bowel syndrome), Restless legs, Sepsis ([place]) (02/2016), and Shortness of breath dyspnea.  PAST SURGICAL HISTORY: She  has a past surgical history that includes Bladder surgery; breast mass right excision; Hernia repair; Coronary stent placement; Appendectomy; shoulder sugery (Left); Anterior cervical decomp/discectomy fusion (N/A, 06/08/2013); Abdominal hysterectomy; EUS (N/A, 10/17/2014); EUS (N/A, 12/04/2015); Laminectomy (Left, 02/11/2016); and EUS (N/A, 12/15/2017).  Allergies  Allergen Reactions  . Ace Inhibitors Cough  . Sulfa Antibiotics Nausea Only    No current facility-administered medications on file prior to encounter.    Current Outpatient Medications on File Prior to Encounter  Medication Sig   . albuterol (PROVENTIL HFA;VENTOLIN HFA) 108 (90 Base) MCG/ACT inhaler Inhale 2 puffs into the lungs every 6 (six) hours as needed for wheezing or shortness of breath.  . ALPRAZolam (XANAX) 0.25 MG tablet Take 0.25 mg by mouth 3 (three) times daily as needed for anxiety.  [name] aspirin 81 MG chewable tablet Chew 1 tablet (81 mg total) by mouth daily.  . cyanocobalamin (,VITAMIN B-12,) 1000 MCG/ML injection Inject 1,000 mcg into the muscle every 30 (thirty) days.  [name] HYDROcodone-acetaminophen (NORCO) 10-325 MG tablet Take 1 tablet by mouth 3 (three) times daily as needed for severe pain.  [name] losartan (COZAAR) 100 MG tablet Take 100 mg by mouth daily.  . magnesium oxide (MAG-OX) 400 MG tablet Take 400 mg by mouth daily.  . metoprolol succinate (TOPROL-XL) 100 MG 24 hr tablet Take 100 mg by mouth 2 (two) times daily. Take with or immediately following a meal.  . mirtazapine (REMERON) 15 MG tablet Take 15 mg by mouth at bedtime.  . nitroGLYCERIN (NITROSTAT) 0.4 MG SL tablet Place 0.4 mg under the tongue every 5 (five) minutes as needed for chest pain.  . Nutritional Supplements (ESTROVEN PO) Take 1 tablet by mouth at bedtime.  [name] omeprazole (PRILOSEC) 20 MG capsule Take 20 mg by mouth 2 (two) times daily before a meal.   . PARoxetine (PAXIL) 20 MG tablet Take 20 mg by mouth every morning.   . potassium chloride (K-DUR,KLOR-CON) 10 MEQ tablet Take 10 mEq by mouth 2 (two) times daily.  [name]  promethazine (PHENERGAN) 25 MG tablet Take 25 mg by mouth every 4 (four) hours as needed for nausea or vomiting.  . simvastatin (ZOCOR) 40 MG tablet Take 40 mg by mouth at bedtime.     FAMILY HISTORY:  Her indicated that the status of her mother is unknown. She indicated that the status of her father is unknown. She indicated that the status of her brother is unknown.   SOCIAL HISTORY: She  reports that she has been smoking cigarettes.  She has a 50.00 pack-year smoking history. She has never used smokeless tobacco. She  reports that she does not drink alcohol or use drugs.  REVIEW OF SYSTEMS:   Unobtainable  SUBJECTIVE:  Unobtainable  VITAL SIGNS: BP (!) 178/66   Pulse (!) 136   Temp 98 F (36.7 C) (Oral)   Resp 20   Ht 5' 1"  (1.549 m)   Wt 126 lb 8.7 oz (57.4 kg)   SpO2 97%   BMI 23.91 kg/m   HEMODYNAMICS:    VENTILATOR SETTINGS: Vent Mode: PRVC FiO2 (%):  [30 %] 30 % Set Rate:  [16 bmp] 16 bmp Vt Set:  [450 mL] 450 mL PEEP:  [5 cmH20] 5 cmH20 Plateau Pressure:  [18 cmH20-21 cmH20] 18 cmH20  INTAKE / OUTPUT: I/O last 3 completed shifts: In: 3846.6 [I.V.:2546.6; NG/GT:1000; IV Piggyback:300] Out: 2145 [Urine:2145]  PHYSICAL EXAMINATION: General: Elderly and chronically ill-appearing appearing, orally intubated and mechanically ventilated    Neuro: She is randomly moving all fours but not spontaneously opening her eyes.  She does not respond to voice, pupils are equal  Cardiovascular: S1 and S2 are rapid and irregularly irregular without murmur or gallop  Lungs: She is orally intubated, there is symmetric air movement some scattered rhonchi and no wheezes at present.     Abdomen: The abdomen is obese and soft without any organomegaly masses or tenderness.   Skin: Again is extraordinarily friable   She has diffuse scattered ecchymoses.  LABS:  BMET Recent Labs  Lab 01/05/18 0412 01/05/18 1221 01/07/18 0510 01/07/18 1459 01/08/18 0500  NA 136 140 141  --  139  K 3.3* 2.8* 5.0 5.1 4.6  CL 94* 107 104  --  98*  CO2 30  --  30  --  31  BUN 27* 16 27*  --  29*  CREATININE 0.83 0.40* 0.69  --  0.76  GLUCOSE 189* 120* 183*  --  169*    Electrolytes Recent Labs  Lab 01/05/18 0412 01/07/18 0510 01/08/18 0500  CALCIUM 8.0* 8.7* 9.5  MG 1.8 1.5* 1.6*  PHOS 2.7  --   --     CBC Recent Labs  Lab 01/06/18 0944 01/07/18 0510 01/08/18 0500  WBC 18.8* 14.8* 18.3*  HGB 9.7* 8.5* 8.4*  HCT 31.5* 27.3* 27.1*  PLT 316 251 263    Coag's Recent Labs  Lab 01/05/18 1200   APTT 24  INR 1.17    Sepsis Markers Recent Labs  Lab 01/07/18 0510 01/07/18 1000 01/08/18 0500  PROCALCITON 0.41 0.39 0.51    ABG Recent Labs  Lab 01/06/18 1323 01/07/18 1511 01/08/18 0438  PHART 7.366 7.440 7.483*  PCO2ART 53.7* 50.1* 41.8  PO2ART 150.0* 91.0 84.0    Liver Enzymes Recent Labs  Lab 12/23/2017 1523  AST 17  ALT 22  ALKPHOS 45  BILITOT 0.9  ALBUMIN 2.9*    Cardiac Enzymes No results for input(s): TROPONINI, PROBNP in the last 168 hours.  Glucose Recent Labs  Lab 01/07/18  1143 01/07/18 1538 01/07/18 2003 01/07/18 2348 01/08/18 0424 01/08/18 0842  GLUCAP 165* 140* 182* 160* 163* 164*    Imaging No results found.   ANTIBIOTICS: Zosyn started 4/24 for presumed aspiration.  LINES/TUBES:        DISCUSSION: ASSESSMENT / PLAN:  PULMONARY A: This is a 77 year old who presented with respiratory extremis.  She has a history of COPD.  CT scan of the chest obtained here on 4/25 shows bullous disease and a very dense consolidation in the right lower lobe.  I do not appreciate an air-fluid level in the bullae.  Cultures remain negative except for normal respiratory flora and we will be obtaining a bronchoscopy in the near future to rule out obstruction.  In the interim I have no plans for attempting to wean this patient.  CARDIOVASCULAR A: He is hemodynamically labile with her lability dependent on her level of sedation.  Think the best approach to insurance stability here is to increase her fentanyl until we have settled the issues with her lungs and are able again to attempt a spontaneous breathing trial.  I reviewed her echocardiogram and note that she has severe LVH and I have therefore discontinued dig as an AV blocking agent and we will continue her on Cardizem.  If this proves to be an adequate once she is more deeply sedated I will add a beta-blocker.  RENAL A: Creatinine this morning is normal.     GASTROINTESTINAL A: Prophylaxis is  with Pepcid.  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     HEMATOLOGIC A: Prophylactic  dose heparin is in place.  Pertinent to her diffuse bleeding and anemia coags were normal.    INFECTIOUS A: Pneumonia.  CT scan on 4/26 shows a very dense dependent infiltrate in the Right lower lobe.  Continuing empiric Zosyn pending bronchoscopy.    ENDOCRINE A: Sliding scale insulin is in place with only marginal glycemic control.  I have a dose of long-acting insulin  NEUROLOGIC A: Distant history of right occipital infarct with new subacute left occipital infarct Beginning statin for secondary prevention, full dose heparin for atrial fibrillation not be initiated due to her guaiac positive stool.    Greater than 32 minutes was spent in the care of this acutely ill patient with life-threatening illness.   Lars Masson, MD Critical Care Medicine [facility] Pager: [phone]  01/08/2018, 9:29 AM

## 2018-01-08 NOTE — Progress Notes (Signed)
Pharmacy Antibiotic Note  Melinda Schroeder is a 77 y.o. female admitted on 12/29/2017 with aspiration PNA.  Pharmacy dosing Zosyn for pneumonia. On D#4 of abx. WBC trending up to 18.3. Afebrile. Remains culture negative except positive toxigenic C.diff PCR   Plan: Continue Zosyn 3.375 gm IV Q 8 hours (EI infusion)  Monitor clinical progress, c/s, renal function F/u de-escalation plan/LOT  Height: 5\' 1"  (154.9 cm) Weight: 126 lb 8.7 oz (57.4 kg) IBW/kg (Calculated) : 47.8  Temp (24hrs), Avg:98.7 F (37.1 C), Min:98 F (36.7 C), Max:99.4 F (37.4 C)  Recent Labs  Lab 01/10/2018 1200 12/23/2017 1523 01/05/18 0412 01/05/18 1221 01/06/18 0944 01/07/18 0510 01/08/18 0500  WBC 13.6*  --  18.5*  --  18.8* 14.8* 18.3*  CREATININE  --  0.86 0.83 0.40*  --  0.69 0.76    Estimated Creatinine Clearance: 49.5 mL/min (by C-G formula based on SCr of 0.76 mg/dL).    Allergies  Allergen Reactions  . Ace Inhibitors Cough  . Sulfa Antibiotics Nausea Only    Albertina Parr, PharmD., BCPS Clinical Pharmacist Clinical phone for 01/08/18 until 3:30pm: [phone] If after 3:30pm, please call main pharmacy at: [phone]

## 2018-01-08 NOTE — Plan of Care (Signed)
Pt currently does not have loose stools.

## 2018-01-09 LAB — GLUCOSE, CAPILLARY
GLUCOSE-CAPILLARY: 153 mg/dL — AB (ref 65–99)
GLUCOSE-CAPILLARY: 167 mg/dL — AB (ref 65–99)
GLUCOSE-CAPILLARY: 181 mg/dL — AB (ref 65–99)
Glucose-Capillary: 138 mg/dL — ABNORMAL HIGH (ref 65–99)
Glucose-Capillary: 197 mg/dL — ABNORMAL HIGH (ref 65–99)
Glucose-Capillary: 164 mg/dL — ABNORMAL HIGH (ref 65–99)

## 2018-01-09 LAB — CBC WITH DIFFERENTIAL/PLATELET
BASOS ABS: 0 10*3/uL (ref 0.0–0.1)
Basophils Relative: 0 %
EOS ABS: 0.2 10*3/uL (ref 0.0–0.7)
Eosinophils Relative: 1 %
HCT: 25.9 % — ABNORMAL LOW (ref 36.0–46.0)
Hemoglobin: 7.9 g/dL — ABNORMAL LOW (ref 12.0–15.0)
LYMPHS PCT: 5 %
Lymphs Abs: 0.9 10*3/uL (ref 0.7–4.0)
MCH: 29.3 pg (ref 26.0–34.0)
MCHC: 30.5 g/dL (ref 30.0–36.0)
MCV: 95.9 fL (ref 78.0–100.0)
Monocytes Absolute: 0.5 10*3/uL (ref 0.1–1.0)
Monocytes Relative: 3 %
NEUTROS PCT: 91 %
Neutro Abs: 15.7 10*3/uL — ABNORMAL HIGH (ref 1.7–7.7)
PLATELETS: 283 10*3/uL (ref 150–400)
RBC: 2.7 MIL/uL — AB (ref 3.87–5.11)
RDW: 16.1 % — ABNORMAL HIGH (ref 11.5–15.5)
SMEAR REVIEW: ADEQUATE
WBC: 17.3 10*3/uL — AB (ref 4.0–10.5)

## 2018-01-09 LAB — BASIC METABOLIC PANEL
ANION GAP: 8 (ref 5–15)
BUN: 33 mg/dL — ABNORMAL HIGH (ref 6–20)
CALCIUM: 9.3 mg/dL (ref 8.9–10.3)
CO2: 32 mmol/L (ref 22–32)
Chloride: 101 mmol/L (ref 101–111)
Creatinine, Ser: 0.72 mg/dL (ref 0.44–1.00)
GFR calc Af Amer: >60 mL/min (ref >60)
GLUCOSE: 153 mg/dL — AB (ref 65–99)
POTASSIUM: 4.2 mmol/L (ref 3.5–5.1)
SODIUM: 141 mmol/L (ref 135–145)

## 2018-01-09 LAB — CULTURE, BLOOD (ROUTINE X 2)
CULTURE: NO GROWTH
Culture: NO GROWTH
Special Requests: ADEQUATE
Special Requests: ADEQUATE

## 2018-01-09 LAB — BLOOD GAS, ARTERIAL
ACID-BASE EXCESS: 7.2 mmol/L — AB (ref 0.0–2.0)
Bicarbonate: 32 mmol/L — ABNORMAL HIGH (ref 20.0–28.0)
DRAWN BY: 41977
FIO2: 30
O2 Saturation: 94.4 %
PATIENT TEMPERATURE: 98.6
PCO2 ART: 53.1 mmHg — AB (ref 32.0–48.0)
PEEP: 5 cmH2O
PH ART: 7.397 (ref 7.350–7.450)
PO2 ART: 74.8 mmHg — AB (ref 83.0–108.0)
RATE: 16 resp/min
VT: 450 mL

## 2018-01-09 LAB — MAGNESIUM: Magnesium: 1.5 mg/dL — ABNORMAL LOW (ref 1.7–2.4)

## 2018-01-09 LAB — PROCALCITONIN: PROCALCITONIN: 0.34 ng/mL

## 2018-01-09 MED ORDER — POTASSIUM CHLORIDE 20 MEQ/15ML (10%) PO SOLN
20.0000 meq | Freq: Once | ORAL | Status: AC
  Administered 2018-01-09: 20 meq
  Filled 2018-01-09: qty 15

## 2018-01-09 MED ORDER — MAGNESIUM SULFATE 2 GM/50ML IV SOLN
2.0000 g | Freq: Once | INTRAVENOUS | Status: AC
  Administered 2018-01-09: 2 g via INTRAVENOUS
  Filled 2018-01-09: qty 50

## 2018-01-09 MED ORDER — DILTIAZEM 12 MG/ML ORAL SUSPENSION
60.0000 mg | Freq: Four times a day (QID) | ORAL | Status: DC
  Administered 2018-01-09 – 2018-01-13 (×17): 60 mg
  Filled 2018-01-09 (×18): qty 6

## 2018-01-09 MED ORDER — DILTIAZEM HCL 100 MG IV SOLR
5.0000 mg/h | INTRAVENOUS | Status: DC
  Filled 2018-01-09: qty 100

## 2018-01-09 MED ORDER — FUROSEMIDE 10 MG/ML IJ SOLN
40.0000 mg | Freq: Once | INTRAMUSCULAR | Status: AC
  Administered 2018-01-09: 40 mg via INTRAVENOUS
  Filled 2018-01-09: qty 4

## 2018-01-09 NOTE — Progress Notes (Signed)
PULMONARY / CRITICAL CARE MEDICINE   Name: Melinda Schroeder MRN: [id number] DOB: 12/17/1940    ADMISSION DATE:  12/15/2017  CHIEF COMPLAINT:  Respiratory distress, CVA, question of seizure  BRIEF SUMMARY:   77 year old female who presented to [facility] 4/17 via EMS with reports of respiratory distress.  Admitted from 4/17-4/24 at [facility] with acute respiratory failure in setting of suspected aspiration (RLL, NOS).  Hospital course notable for negative LP, negative CTA chest for PE, hypo/hypertension, AFwRVR, prolonged respiratory failure on vent, agitated delirium, new small left PCA territory infarct and possible aspiration while on vent.  Tx to [facility] on 4/24 for Neurology evaluation.    PMH -CAD, HTN, HLD, GERD, diverticulitis, esophageal dysmotility, prior esophageal dilatation, tobacco abuse, COPD.  On 4/4 she had an upper endoscopy by Dr. 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  SUBJECTIVE:  RN reports pt remains on cardizem gtt, tolerating lopressor PT.  Concerned femoral aline / site moist in groin.  No diarrhea for 72 hours.  Afebrile.  5.5L positive for admit.   VITAL SIGNS: BP (!) 156/72   Pulse (!) 138   Temp 97.6 F (36.4 C) (Axillary)   Resp 17   Ht _0  (1.549 m)   Wt 153 lb 7 oz (69.6 kg)   SpO2 96%   BMI 28.99 kg/m   HEMODYNAMICS:    VENTILATOR SETTINGS: Vent Mode: PRVC FiO2 (%):  [30 %] 30 % Set Rate:  [16 bmp] 16 bmp Vt Set:  [450 mL] 450 mL PEEP:  [5 cmH20] 5 cmH20 Plateau Pressure:  [17 cmH20-25 cmH20] 17 cmH20  INTAKE / OUTPUT: I/O last 3 completed shifts: In: 2772.8 [I.V.:2004.8; NG/GT:518; IV Piggyback:250] Out: 1850 [OACZY:6063]  PHYSICAL EXAMINATION: General:  Ill appearing female in NAD on vent HEENT: MM pink/moist, ETT Neuro: opens eyes to voice, follows commands, drifts back to sleep   CV: s1s2 rrr, no m/r/g PULM: even/non-labored, lungs bilaterally clear KZ:SWFU, non-tender, bsx4 active  Extremities: warm/dry, anasarca edema  Skin: thin, fragile skin, multiple areas of ecchymosis, skin tears  LABS:  BMET Recent Labs  Lab 01/07/18 0510 01/07/18 1459 01/08/18 0500 01/09/18 0604  NA 141  --  139 141  K 5.0 5.1 4.6 4.2  CL 104  --  98* 101  CO2 30  --  31 32  BUN 27*  --  29* 33*  CREATININE 0.69  --  0.76 0.72  GLUCOSE 183*  --  169* 153*    Electrolytes Recent Labs  Lab 01/05/18 0412 01/07/18 0510 01/08/18 0500 01/09/18 0604  CALCIUM 8.0* 8.7* 9.5 9.3  MG 1.8 1.5* 1.6* 1.5*  PHOS 2.7  --   --   --     CBC Recent Labs  Lab 01/07/18 0510 01/08/18 0500 01/09/18 0604  WBC 14.8* 18.3* 17.3*  HGB 8.5* 8.4* 7.9*  HCT 27.3* 27.1* 25.9*  PLT 251 263 283    Coag's Recent Labs  Lab 01/05/18 1200  APTT 24  INR 1.17    Sepsis Markers Recent Labs  Lab 01/07/18 1000 01/08/18 0500 01/09/18 0604  PROCALCITON 0.39 0.51 0.34    ABG Recent Labs  Lab 01/07/18 1511 01/08/18 0438 01/09/18 0340  PHART 7.440 7.483* 7.397  PCO2ART 50.1* 41.8 53.1*  PO2ART 91.0 84.0 74.8*    Liver Enzymes Recent Labs  Lab 12/21/2017 1523  AST 17  ALT  22  ALKPHOS 45  BILITOT 0.9  ALBUMIN 2.9*    Cardiac Enzymes No results for input(s): TROPONINI, PROBNP in the last 168 hours.  Glucose Recent Labs  Lab 01/08/18 1525 01/08/18 1943 01/08/18 2337 01/09/18 0340 01/09/18 0809 01/09/18 1126  GLUCAP 146* 159* 159* 138* 153* 197*    Imaging No results found.  STUDIES:  CT Head w/o 4/17 >> old right occipital infarct, no acute intracranial abnormality.   CTA Chest 4/17 >> no PE, mild interstitial edema, focal atelectasis in the right lower lobe with question of an infected bleb in the right lower lobe, aortic atherosclerosis and emphysema, left ventricular hypertrophy.   CT Head 4/20 >> interval development of acute early subacute small left PCA  territory infarct involving the left occipital pole.  No associated hemorrhage or mass-effect.  This was new from the prior study on 4/17.  Chronic right PCA territory infarct, stable. 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  CT Chest 4/25 >> increased small bilateral effusions, consolidation in RLL, mild interstitial edema, small volume perihepatic ascites CTA Head/Neck 4/25 >> no emergent arterial findings, small left occipital infarct is enhancing consistent with subacute timing, advanced / extensive atherosclerosis, advanced narrowing of the proximal right PCA, remote right occipital infarct, emphysema   CULTURES:  [facility] (confirmed 4/24) CSF 4/17 >> rare WBC, no organisms, negative culture HSV CSF 4/17 >> negative  BCx2 4/17 >> negative  UC 4/17 >> negative  Flu 4/17 >> negative  MRSA PCR 4/17 >> positive  Tracheal Aspirate 4/22 >> normal flora    ........................ BCx2 4/24 >> negative Sputum 4/24 >>  UC 4/24 >>  C-Diff PCR 4/26 >> antigen + PCR positive, toxin negative  BAL 4/28 >>   ANTIBIOTICS: Vanco 4/17 >> 4/24 Zosyn 4/17 >> 4/29 Vanco (PO for equivocal C-diff PCR) 4/26 >>   LINES ETT 4/17 >> 4/26 (failed, poor secretion clearance) ETT 4/26 >>    SIGNIFICANT EVENTS: 4/17  Admit to [facility]   DISCUSSION: 77 y/o F with underlying CAD, emphysema, tobacco abuse, gastric mass and recent candidal esophageal infection admitted on 4/17 to [facility] with respiratory distress, concern for aspiration and possible seizure.  Intubated.  Course complicated by AKI, AF, hypo/hypertension and difficulty  weaning.  Tx to [facility] 4/24.   ASSESSMENT / PLAN:  PULMONARY A: Acute Hypoxic Respiratory Failure  RLL Aspiration PNA Bilateral Pleural Effusions - small Tobacco Abuse - 60+ years, heaviest 1.5 ppd, currently 0.5 ppd COPD / Emphysema - noted on CT  P:   PRVC 8 cc/kg   Wean PEEP / FiO2 for sats 88-94% ABG reviewed 4/29  Follow intermittent CXR  Daily SBT / WUA  Duoneb Q6  Nicotine patch Guaifenesin BID   CARDIOVASCULAR A:  AF with RVR - currently ST, reportedly sensitive to albuterol  Prolonged QT - noted on EKG on admit, 499 CAD s/p Stent - noted on CT imaging  Aortic Aneurysm - noted on CT imaging  P:  ICU monitoring  Lopressor 50 mg BID  Monitor QTc ASA QD  Begin oral cardizem, wean gtt to off per protocol  Lasix 40 mg IV x1 + 20 mEq KCL KVO IVF   RENAL  A:   AKI - in setting of contrast, diuretics, hypo/hypertension Hypomagnesemia  P:   Trend BMP / urinary output Replace electrolytes as indicated, Mg 4/29 Avoid nephrotoxic agents, ensure adequate renal perfusion LR to KVO Lasix as above    GASTROINTESTINAL A:   At Risk Malnutrition  Equivocal C-Diff PCR  Hx GERD, Diverticulosis, Gastric Mass (EGD 4/4), Candidal Esophageal Infection  P:   NPO / OGT  TF per Nutrition  Pepcid for SUP  See ID   HEMATOLOGIC A:   Anemia - suspect element of chronic disease, phlebotomy  P:  Trend CBC  SCD's + heparin SQ for DVT prophylaxis   INFECTIOUS A:   RLL Aspiration PNA  Equivocal C-Diff Testing - treatment initiated 4/26 P:   Discontinue zosyn  Follow cultures  Oral vanco for 10 days total for equivocal c-diff > diarrhea slowed, PCT negative, ? Active infection vs sensitive PCR (more likely) Follow WBC trend/ fever curve   ENDOCRINE A:   Hyperglycemia    P:   SSI, sensitive scale   NEUROLOGIC A: Concern for L PCA Infarct - small on CT, MRI pending Hx R PCA CVA   Hx RLS, Anxiety, DDD Home Benzo Use  P:   RASS goal: 0 to -1  Precedex /  Fentanyl gtt for pain / sedation  Continue home paxil  Attempt to wean gtt's to off   FAMILY  - Updates:  No family at bedside 4/29.    4/24 update - Husband, Son, Daughter-in-law, brother updated at bedside in full.  Daughter-in-law is an Therapist, sports and works in [employer], site.  Family asking about code status. They want to continue aggressive support if opportunity for recovery.  However, they would not want extraordinary measures if she suffered a cardiac arrest.  No trach or facility.  LCB.   CC Time: 30 minutes  Noe Gens, NP-C [facility] Pgr: [phone] or if no answer [phone] 01/09/2018, 12:15 PM

## 2018-01-10 ENCOUNTER — Inpatient Hospital Stay (HOSPITAL_COMMUNITY): Payer: Medicare HMO

## 2018-01-10 LAB — GLUCOSE, CAPILLARY
GLUCOSE-CAPILLARY: 176 mg/dL — AB (ref 65–99)
GLUCOSE-CAPILLARY: 168 mg/dL — AB (ref 65–99)
Glucose-Capillary: 140 mg/dL — ABNORMAL HIGH (ref 65–99)
Glucose-Capillary: 154 mg/dL — ABNORMAL HIGH (ref 65–99)
Glucose-Capillary: 161 mg/dL — ABNORMAL HIGH (ref 65–99)
Glucose-Capillary: 168 mg/dL — ABNORMAL HIGH (ref 65–99)

## 2018-01-10 LAB — CBC
HEMATOCRIT: 25.9 % — AB (ref 36.0–46.0)
HEMOGLOBIN: 7.9 g/dL — AB (ref 12.0–15.0)
MCH: 29.3 pg (ref 26.0–34.0)
MCHC: 30.5 g/dL (ref 30.0–36.0)
MCV: 95.9 fL (ref 78.0–100.0)
PLATELETS: 279 10*3/uL (ref 150–400)
RBC: 2.7 MIL/uL — AB (ref 3.87–5.11)
RDW: 16.1 % — ABNORMAL HIGH (ref 11.5–15.5)
WBC: 15.5 10*3/uL — ABNORMAL HIGH (ref 4.0–10.5)

## 2018-01-10 LAB — BASIC METABOLIC PANEL
ANION GAP: 6 (ref 5–15)
BUN: 40 mg/dL — ABNORMAL HIGH (ref 6–20)
CHLORIDE: 103 mmol/L (ref 101–111)
CO2: 32 mmol/L (ref 22–32)
Calcium: 9.5 mg/dL (ref 8.9–10.3)
Creatinine, Ser: 0.72 mg/dL (ref 0.44–1.00)
GFR calc Af Amer: >60 mL/min (ref >60)
GLUCOSE: 164 mg/dL — AB (ref 65–99)
POTASSIUM: 4.3 mmol/L (ref 3.5–5.1)
Sodium: 141 mmol/L (ref 135–145)

## 2018-01-10 LAB — PROCALCITONIN: Procalcitonin: 0.35 ng/mL

## 2018-01-10 MED ORDER — QUETIAPINE FUMARATE 25 MG PO TABS
25.0000 mg | ORAL_TABLET | Freq: Every day | ORAL | Status: AC
Start: 2018-01-10 — End: 2018-01-10
  Administered 2018-01-10: 25 mg
  Filled 2018-01-10: qty 1

## 2018-01-10 MED ORDER — SODIUM CHLORIDE 0.9 % IV BOLUS
250.0000 mL | Freq: Once | INTRAVENOUS | Status: AC
  Administered 2018-01-10: 250 mL via INTRAVENOUS

## 2018-01-10 NOTE — Plan of Care (Signed)
Pt voiding freely with use of external catheter. Pt skin frail with multiple skin tears and bruises; all being monitored tended to with foam or gauze wrap.

## 2018-01-10 NOTE — Progress Notes (Signed)
PULMONARY / CRITICAL CARE MEDICINE   Name: RAENA PAU MRN: [id number] DOB: 07/25/1941    ADMISSION DATE:  12/12/2017  CHIEF COMPLAINT:  Respiratory distress, CVA, question of seizure  BRIEF SUMMARY:   77 year old female who presented to [facility] 4/17 via EMS with reports of respiratory distress.  Admitted from 4/17-4/24 at [facility] with acute respiratory failure in setting of suspected aspiration (RLL, NOS).  Hospital course notable for negative LP, negative CTA chest for PE, hypo/hypertension, AFwRVR, prolonged respiratory failure on vent, agitated delirium, new small left PCA territory infarct and possible aspiration while on vent.  Tx to [facility] on 4/24 for Neurology evaluation.    PMH -CAD, HTN, HLD, GERD, diverticulitis, esophageal dysmotility, prior esophageal dilatation, tobacco abuse, COPD.  On 4/4 she had an upper endoscopy by Dr. 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  SUBJECTIVE:   She is still sedated on a combination of fentanyl and Precedex this morning and is not at all interactive with me.  She is requiring no pressors.  She remains intubated and mechanically ventilated.  VITAL SIGNS: BP (!) 145/50   Pulse 83   Temp 97.7 F (36.5 C) (Axillary)   Resp (!) 22   Ht _0  (1.549 m)   Wt 121 lb 4.1 oz (55 kg)   SpO2 94%   BMI 22.91 kg/m   HEMODYNAMICS:    VENTILATOR SETTINGS: Vent Mode: PCV FiO2 (%):  [30 %] 30 % Set Rate:  [10 bmp-16 bmp] 10 bmp Vt Set:  [450 mL] 450 mL PEEP:  [5 cmH20] 5 cmH20 Plateau Pressure:  [17 cmH20-24 cmH20] 20 cmH20  INTAKE / OUTPUT: I/O last 3 completed shifts: In: 2993.7 [I.V.:1933.7; NG/GT:960; IV Piggyback:100] Out: 1925 [Urine:1925]  PHYSICAL EXAMINATION: General:  Ill appearing female in NAD on vent HEENT: MM pink/moist, ETT Neuro: opens eyes to voice, follows  commands, drifts back to sleep  CV: s1s2 rrr, no m/r/g PULM: even/non-labored, lungs bilaterally clear QI:HKVQ, non-tender, bsx4 active  Extremities: warm/dry, anasarca edema  Skin: thin, fragile skin, multiple areas of ecchymosis, skin tears  LABS:  BMET Recent Labs  Lab 01/08/18 0500 01/09/18 0604 01/10/18 0529  NA 139 141 141  K 4.6 4.2 4.3  CL 98* 101 103  CO2 31 32 32  BUN 29* 33* 40*  CREATININE 0.76 0.72 0.72  GLUCOSE 169* 153* 164*    Electrolytes Recent Labs  Lab 01/05/18 0412 01/07/18 0510 01/08/18 0500 01/09/18 0604 01/10/18 0529  CALCIUM 8.0* 8.7* 9.5 9.3 9.5  MG 1.8 1.5* 1.6* 1.5*  --   PHOS 2.7  --   --   --   --     CBC Recent Labs  Lab 01/08/18 0500 01/09/18 0604 01/10/18 0529  WBC 18.3* 17.3* 15.5*  HGB 8.4* 7.9* 7.9*  HCT 27.1* 25.9* 25.9*  PLT 263 283 279    Coag's Recent Labs  Lab 01/05/18 1200  APTT 24  INR 1.17    Sepsis Markers Recent Labs  Lab 01/07/18 1000 01/08/18 0500 01/09/18 0604  PROCALCITON 0.39 0.51 0.34    ABG Recent Labs  Lab 01/07/18 1511 01/08/18 0438 01/09/18 0340  PHART 7.440 7.483* 7.397  PCO2ART 50.1* 41.8 53.1*  PO2ART 91.0 84.0 74.8*    Liver Enzymes Recent Labs  Lab 01/03/2018 1523  AST 17  ALT 22  ALKPHOS 45  BILITOT 0.9  ALBUMIN  2.9*    Cardiac Enzymes No results for input(s): TROPONINI, PROBNP in the last 168 hours.  Glucose Recent Labs  Lab 01/09/18 2027 01/09/18 2036 01/09/18 2335 01/10/18 0347 01/10/18 0841 01/10/18 1137  GLUCAP >600* 167* 181* 140* 176* 161*    Imaging Dg Chest Port 1 View  Result Date: 01/10/2018 CLINICAL DATA:  Acute respiratory failure with hypoxia EXAM: PORTABLE CHEST 1 VIEW COMPARISON:  01/08/2018 FINDINGS: Endotracheal tube 1 cm above the carina. NG tube in the stomach. Central venous catheter tip at the cavoatrial junction unchanged. Progression of bibasilar airspace disease and bilateral pleural effusions since the prior study. No  pneumothorax. IMPRESSION: Endotracheal tube approximately 1 cm above the carina. Progression of bibasilar airspace disease and bilateral pleural effusions. Electronically Signed   By: Franchot Gallo M.D.   On: 01/10/2018 09:42    STUDIES:  CT Head w/o 4/17 >> old right occipital infarct, no acute intracranial abnormality.   CTA Chest 4/17 >> no PE, mild interstitial edema, focal atelectasis in the right lower lobe with question of an infected bleb in the right lower lobe, aortic atherosclerosis and emphysema, left ventricular hypertrophy.   CT Head 4/20 >> interval development of acute early subacute small left PCA territory infarct involving the left occipital pole.  No associated hemorrhage or mass-effect.  This was new from the prior study on 4/17.  Chronic right PCA territory infarct, stable. 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  CT Chest 4/25 >> increased small bilateral effusions, consolidation in RLL, mild interstitial edema, small volume perihepatic ascites CTA Head/Neck 4/25 >> no emergent arterial findings, small left occipital infarct is enhancing consistent with subacute timing, advanced / extensive atherosclerosis, advanced narrowing of the proximal right PCA, remote right occipital infarct, emphysema   CULTURES:  [facility] (confirmed 4/24) CSF 4/17 >> rare WBC, no organisms, negative culture HSV CSF 4/17 >> negative  BCx2 4/17 >> negative  UC 4/17 >> negative  Flu 4/17 >> negative  MRSA PCR 4/17 >> positive  Tracheal Aspirate 4/22 >> normal flora    ........................ BCx2 4/24 >> negative Sputum 4/24 >>  UC 4/24 >>   C-Diff PCR 4/26 >> antigen + PCR positive, toxin negative  BAL 4/28 >>   ANTIBIOTICS: Vanco 4/17 >> 4/24 Zosyn 4/17 >> 4/29 Vanco (PO for equivocal C-diff PCR) 4/26 >>   LINES ETT 4/17 >> 4/26 (failed, poor secretion clearance) ETT 4/26 >>    SIGNIFICANT EVENTS: 4/17  Admit to [facility]   DISCUSSION: 77 y/o F with underlying CAD, emphysema, tobacco abuse, gastric mass and recent candidal esophageal infection admitted on 4/17 to [facility] with respiratory distress, concern for aspiration and possible seizure.  Intubated.  Course complicated by AKI, AF, hypo/hypertension and difficulty weaning.  Tx to [facility] 4/24.   ASSESSMENT / PLAN:  PULMONARY A: Acute Hypoxic Respiratory Failure  RLL Aspiration PNA Bilateral Pleural Effusions - small Tobacco Abuse - 60+ years, heaviest 1.5 ppd, currently 0.5 ppd COPD / Emphysema - noted on CT  P:   She is a very low minute volume requirement and is not at all bronchospastic today.  I am again attempting to transfer work of breathing to the patient.  I switched her initially to pressure control ventilation with a delta P of only 12 and although she is maintaining volumes on that small amount of support she is demonstrating a Cheyne-Stokes breathing pattern.  I am concerned that she has had 2 prior posterior circulation infarcts, that her poor mental status and breathing pattern may represent yet another.  An MRI is pending before I proceed further with attempts to separate her from mechanical ventilation.  In addition she has had a great deal of hemodynamic lability when we attempt to decrease her sedation to separate her from mechanical ventilation and I have added a dose of Seroquel today hoping to be able to wean her off of the fentanyl.  CARDIOVASCULAR A:  AF with RVR - currently ST, reportedly sensitive to albuterol  Prolonged QT - noted on EKG on admit, 499 CAD s/p Stent - noted on CT imaging  Aortic Aneurysm - noted on  CT imaging  P:  As noted she has a great deal of hemodynamic lability.  It is not clear to me whether this is secondary to agitation/delirium or CNS process.  As noted a repeat MRI is pending and I am attempting to optimize her delirium control. Lopressor 50 mg BID  Monitor QTc She appears to have CAD, she continues on a beta-blocker and aspirin and she continues on her statin.    Begin oral cardizem, wean gtt to off per protocol  Lasix 40 mg IV x1 + 20 mEq KCL KVO IVF   RENAL A:   AKI - in setting of contrast, diuretics, hypo/hypertension Hypomagnesemia  P:   Trend BMP / urinary output Replace electrolytes as indicated, Mg 4/29 Avoid nephrotoxic agents, ensure adequate renal perfusion LR to KVO Lasix as above    GASTROINTESTINAL A:   At Risk Malnutrition  Equivocal C-Diff PCR  Hx GERD, Diverticulosis, Gastric Mass (EGD 4/4), Candidal Esophageal Infection  P:   NPO / OGT  TF per Nutrition  Pepcid for SUP  See ID   HEMATOLOGIC A:   Anemia - suspect element of chronic disease, phlebotomy  P:  Trend CBC  SCD's + heparin SQ for DVT prophylaxis   INFECTIOUS A:   RLL Aspiration PNA  Equivocal C-Diff Testing - treatment initiated 4/26 P:   She is off of all antibiotics other than oral vancomycin which is in place due to the finding of a C. difficile there is not a toxin producer.  She has a persistent leukocytosis and I very much doubt C. difficile is the issue.  The culture from her BAL has not yet been finalized.  I have repeated blood cultures and urine culture today and ordered repeat pro calcitonin's.  If the provocation for her leukocytosis remains enigmatic, I will consider repeating her abdominal CT scan. Oral vanco for 10 days total for equivocal c-diff > diarrhea slowed, PCT negative, ? Active infection vs sensitive PCR (more likely)    ENDOCRINE A:   Hyperglycemia    P:   SSI, sensitive scale   NEUROLOGIC A: She has a distant right PCA infarct and  recent left PCA infarct.  She is demonstrating Cheyne-Stokes respirations today and I am concerned that she is suffered from yet another posterior circulation infarct.  A repeat MRI has been ordered.  She continues on aspirin and statin.  I have also ordered an ionized calcium TSH and liver function studies in order to ensure that we have not missed a metabolic provocation for her very poor mental status  greater than 32 minutes was spent in the care of this unstable patient today  Lars Masson, MD [place] [facility] Pgr: [phone] or if no answer [phone] 01/10/2018, 12:26 PM

## 2018-01-11 LAB — COMPREHENSIVE METABOLIC PANEL
ALBUMIN: 2.1 g/dL — AB (ref 3.5–5.0)
ALT: 105 U/L — ABNORMAL HIGH (ref 14–54)
ANION GAP: 9 (ref 5–15)
AST: 41 U/L (ref 15–41)
Alkaline Phosphatase: 53 U/L (ref 38–126)
BILIRUBIN TOTAL: 0.9 mg/dL (ref 0.3–1.2)
BUN: 37 mg/dL — ABNORMAL HIGH (ref 6–20)
CHLORIDE: 103 mmol/L (ref 101–111)
CO2: 33 mmol/L — ABNORMAL HIGH (ref 22–32)
Calcium: 9.5 mg/dL (ref 8.9–10.3)
Creatinine, Ser: 0.64 mg/dL (ref 0.44–1.00)
GFR calc Af Amer: >60 mL/min (ref >60)
GFR calc non Af Amer: >60 mL/min (ref >60)
Glucose, Bld: 191 mg/dL — ABNORMAL HIGH (ref 65–99)
POTASSIUM: 4.4 mmol/L (ref 3.5–5.1)
Sodium: 145 mmol/L (ref 135–145)
TOTAL PROTEIN: 5.4 g/dL — AB (ref 6.5–8.1)

## 2018-01-11 LAB — MAGNESIUM: MAGNESIUM: 1.8 mg/dL (ref 1.7–2.4)

## 2018-01-11 LAB — CULTURE, BAL-QUANTITATIVE: SPECIAL REQUESTS: NORMAL

## 2018-01-11 LAB — CBC WITH DIFFERENTIAL/PLATELET
BASOS ABS: 0 10*3/uL (ref 0.0–0.1)
BASOS PCT: 0 %
Eosinophils Absolute: 0 10*3/uL (ref 0.0–0.7)
Eosinophils Relative: 0 %
HCT: 25.4 % — ABNORMAL LOW (ref 36.0–46.0)
HEMOGLOBIN: 7.5 g/dL — AB (ref 12.0–15.0)
LYMPHS PCT: 11 %
Lymphs Abs: 1.7 10*3/uL (ref 0.7–4.0)
MCH: 29 pg (ref 26.0–34.0)
MCHC: 29.5 g/dL — ABNORMAL LOW (ref 30.0–36.0)
MCV: 98.1 fL (ref 78.0–100.0)
Monocytes Absolute: 0.8 10*3/uL (ref 0.1–1.0)
Monocytes Relative: 5 %
NEUTROS PCT: 84 %
Neutro Abs: 13.1 10*3/uL — ABNORMAL HIGH (ref 1.7–7.7)
Platelets: 286 10*3/uL (ref 150–400)
RBC: 2.59 MIL/uL — ABNORMAL LOW (ref 3.87–5.11)
RDW: 16.7 % — ABNORMAL HIGH (ref 11.5–15.5)
WBC: 15.6 10*3/uL — ABNORMAL HIGH (ref 4.0–10.5)

## 2018-01-11 LAB — TSH: TSH: 4.569 u[IU]/mL — ABNORMAL HIGH (ref 0.350–4.500)

## 2018-01-11 LAB — CULTURE, BAL-QUANTITATIVE W GRAM STAIN: Culture: 5000 — AB

## 2018-01-11 LAB — GLUCOSE, CAPILLARY
GLUCOSE-CAPILLARY: 158 mg/dL — AB (ref 65–99)
GLUCOSE-CAPILLARY: 163 mg/dL — AB (ref 65–99)
GLUCOSE-CAPILLARY: 165 mg/dL — AB (ref 65–99)
Glucose-Capillary: 137 mg/dL — ABNORMAL HIGH (ref 65–99)
Glucose-Capillary: 160 mg/dL — ABNORMAL HIGH (ref 65–99)
Glucose-Capillary: 192 mg/dL — ABNORMAL HIGH (ref 65–99)

## 2018-01-11 LAB — PROCALCITONIN: PROCALCITONIN: 0.15 ng/mL

## 2018-01-11 LAB — URINE CULTURE
Culture: NO GROWTH
SPECIAL REQUESTS: NORMAL

## 2018-01-11 MED ORDER — METOPROLOL TARTRATE 50 MG PO TABS
50.0000 mg | ORAL_TABLET | Freq: Three times a day (TID) | ORAL | Status: DC
  Administered 2018-01-11 – 2018-01-13 (×6): 50 mg via ORAL
  Filled 2018-01-11 (×6): qty 1

## 2018-01-11 MED ORDER — VITAL AF 1.2 CAL PO LIQD
1000.0000 mL | ORAL | Status: DC
  Administered 2018-01-11: 1000 mL

## 2018-01-11 MED ORDER — HEPARIN SODIUM (PORCINE) 5000 UNIT/ML IJ SOLN
5000.0000 [IU] | Freq: Two times a day (BID) | INTRAMUSCULAR | Status: DC
  Administered 2018-01-11 – 2018-01-13 (×4): 5000 [IU] via SUBCUTANEOUS
  Filled 2018-01-11 (×4): qty 1

## 2018-01-11 MED ORDER — SENNA 8.6 MG PO TABS
1.0000 | ORAL_TABLET | Freq: Every day | ORAL | Status: DC
  Administered 2018-01-11 – 2018-01-13 (×3): 8.6 mg via ORAL
  Filled 2018-01-11 (×3): qty 1

## 2018-01-11 MED ORDER — DOCUSATE SODIUM 50 MG/5ML PO LIQD
100.0000 mg | Freq: Two times a day (BID) | ORAL | Status: DC
  Administered 2018-01-11 – 2018-01-13 (×4): 100 mg via ORAL
  Filled 2018-01-11 (×5): qty 10

## 2018-01-11 MED ORDER — FUROSEMIDE 10 MG/ML IJ SOLN
40.0000 mg | Freq: Once | INTRAMUSCULAR | Status: AC
  Administered 2018-01-11: 40 mg via INTRAVENOUS
  Filled 2018-01-11: qty 4

## 2018-01-11 MED ORDER — PRO-STAT SUGAR FREE PO LIQD
30.0000 mL | Freq: Three times a day (TID) | ORAL | Status: DC
  Administered 2018-01-11 – 2018-01-13 (×6): 30 mL
  Filled 2018-01-11 (×6): qty 30

## 2018-01-11 MED ORDER — POLYETHYLENE GLYCOL 3350 17 G PO PACK
17.0000 g | PACK | Freq: Two times a day (BID) | ORAL | Status: DC | PRN
  Filled 2018-01-11: qty 1

## 2018-01-11 NOTE — Progress Notes (Signed)
Wasted 5,000 mcg (100 mL bag) of fentanyl in the sink verified by [place], RN.

## 2018-01-11 NOTE — Progress Notes (Signed)
PULMONARY / CRITICAL CARE MEDICINE   Name: Melinda Schroeder MRN: [id number] DOB: [birth year]    ADMISSION DATE:  12/28/2017  CHIEF COMPLAINT:  Respiratory distress, CVA, question of seizure  BRIEF SUMMARY:   77 year old female who presented to [facility] 4/17 via EMS with reports of respiratory distress.  Admitted from 4/17-4/24 at [facility] with acute respiratory failure in setting of suspected aspiration (RLL, NOS).  Hospital course notable for negative LP, negative CTA chest for PE, hypo/hypertension, AFwRVR, prolonged respiratory failure on vent, agitated delirium, new small left PCA territory infarct and possible aspiration while on vent.  Tx to [facility] on 4/24 for Neurology evaluation.    PMH -CAD, HTN, HLD, GERD, diverticulitis, esophageal dysmotility, prior esophageal dilatation, tobacco abuse, COPD.  On 4/4 she had an upper endoscopy by Dr. 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  SUBJECTIVE:   She is still sedated on a combination of fentanyl and Precedex this morning and is not at all interactive with me.  She is requiring no pressors.  She remains intubated and mechanically ventilated.  VITAL SIGNS: BP 120/73   Pulse 85   Temp 99.2 F (37.3 C) (Axillary)   Resp (!) 29   Ht _0  (1.549 m)   Wt 115 lb 8.3 oz (52.4 kg)   SpO2 100%   BMI 21.83 kg/m   HEMODYNAMICS:    VENTILATOR SETTINGS: Vent Mode: PCV FiO2 (%):  [30 %-40 %] 40 % Set Rate:  [10 bmp] 10 bmp PEEP:  [5 cmH20] 5 cmH20 Plateau Pressure:  [14 cmH20-16 cmH20] 15 cmH20  INTAKE / OUTPUT: I/O last 3 completed shifts: In: 2402.5 [I.V.:692.5; NG/GT:1710] Out: 2050 [Urine:2050]  PHYSICAL EXAMINATION: General:  Ill appearing female in NAD on vent HEENT: MM pink/moist, ETT Neuro: opens eyes to voice, follows commands, drifts back to sleep  CV: s1s2 rrr, no  m/r/g PULM: respiration appears labored but no adventitial sounds.  Bronchial breathing at R base. QT:MAUQ, non-tender, bsx4 active  Extremities: warm/dry, anasarca edema  Skin: thin, fragile skin, multiple areas of ecchymosis, skin tears  LABS:  BMET Recent Labs  Lab 01/09/18 0604 01/10/18 0529 01/11/18 0533  NA 141 141 145  K 4.2 4.3 4.4  CL 101 103 103  CO2 32 32 33*  BUN 33* 40* 37*  CREATININE 0.72 0.72 0.64  GLUCOSE 153* 164* 191*    Electrolytes Recent Labs  Lab 01/05/18 0412  01/08/18 0500 01/09/18 0604 01/10/18 0529 01/11/18 0533  CALCIUM 8.0*   < > 9.5 9.3 9.5 9.5  MG 1.8   < > 1.6* 1.5*  --  1.8  PHOS 2.7  --   --   --   --   --    < > = values in this interval not displayed.    CBC Recent Labs  Lab 01/09/18 0604 01/10/18 0529 01/11/18 0533  WBC 17.3* 15.5* 15.6*  HGB 7.9* 7.9* 7.5*  HCT 25.9* 25.9* 25.4*  PLT 283 279 286    Coag's Recent Labs  Lab 01/05/18 1200  APTT 24  INR 1.17    Sepsis Markers Recent Labs  Lab 01/09/18 0604 01/10/18 1250 01/11/18 0533  PROCALCITON 0.34 0.35 0.15    ABG Recent Labs  Lab 01/07/18 1511 01/08/18 0438 01/09/18 0340  PHART 7.440 7.483* 7.397  PCO2ART 50.1* 41.8 53.1*  PO2ART 91.0 84.0 74.8*  Liver Enzymes Recent Labs  Lab 12/24/2017 1523 01/11/18 0533  AST 17 41  ALT 22 105*  ALKPHOS 45 53  BILITOT 0.9 0.9  ALBUMIN 2.9* 2.1*    Cardiac Enzymes No results for input(s): TROPONINI, PROBNP in the last 168 hours.  Glucose Recent Labs  Lab 01/10/18 1137 01/10/18 1605 01/10/18 1957 01/10/18 2341 01/11/18 0339 01/11/18 0844  GLUCAP 161* 168* 168* 154* 158* 192*    Imaging Mr Brain Wo Contrast  Addendum Date: 01/10/2018   ADDENDUM REPORT: 01/10/2018 22:41 ADDENDUM: Correction to the above sinus findings. There is a small amount of fluid within both mastoids. Moderate sphenoid sinus mucosal thickening. Electronically Signed   By: Ulyses Jarred M.D.   On: 01/10/2018 22:41    Result Date: 01/10/2018 CLINICAL DATA:  Altered mental status EXAM: MRI HEAD WITHOUT CONTRAST TECHNIQUE: Multiplanar, multiecho pulse sequences of the brain and surrounding structures were obtained without intravenous contrast. COMPARISON:  Brain MRI 12/18/2017 FINDINGS: BRAIN: The midline structures are normal. Persistent diffusion reduction in the left occipital lobe, in unchanged pattern from the prior examination. No new area of scheme E a. old right occipital lobe infarct associated encephalomalacia. No mass lesion, hydrocephalus, dural abnormality or extra-axial collection. Multifocal white matter hyperintensity, most commonly due to chronic ischemic microangiopathy. No age-advanced or lobar predominant atrophy. No chronic microhemorrhage or superficial siderosis. VASCULAR: Major intracranial arterial and venous sinus flow voids are preserved. SKULL AND UPPER CERVICAL SPINE: The visualized skull base, calvarium, upper cervical spine and extracranial soft tissues are normal. SINUSES/ORBITS: No fluid levels or advanced mucosal thickening. No mastoid or middle ear effusion. Normal orbits. IMPRESSION: Unchanged appearance of left occipital lobe subacute infarct. No hemorrhage or mass effect. No new site of ischemia. Electronically Signed: By: Ulyses Jarred M.D. On: 01/10/2018 22:23    STUDIES:  CT Head w/o 4/17 >> old right occipital infarct, no acute intracranial abnormality.   CTA Chest 4/17 >> no PE, mild interstitial edema, focal atelectasis in the right lower lobe with question of an infected bleb in the right lower lobe, aortic atherosclerosis and emphysema, left ventricular hypertrophy.   CT Head 4/20 >> interval development of acute early subacute small left PCA territory infarct involving the left occipital pole.  No associated hemorrhage or mass-effect.  This was new from the prior study on 4/17.  Chronic right PCA territory infarct, stable. 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  CT Chest 4/25 >> increased small bilateral effusions, consolidation in RLL, mild interstitial edema, small volume perihepatic ascites CTA Head/Neck 4/25 >> no emergent arterial findings, small left occipital infarct is enhancing consistent with subacute timing, advanced / extensive atherosclerosis, advanced narrowing of the proximal right PCA, remote right occipital infarct, emphysema   CULTURES:  [facility] (confirmed 4/24) CSF 4/17 >> rare WBC, no organisms, negative culture HSV CSF 4/17 >> negative  BCx2 4/17 >> negative  UC 4/17 >> negative  Flu 4/17 >> negative  MRSA PCR 4/17 >> positive  Tracheal Aspirate 4/22 >> normal flora    ........................ BCx2 4/24 >> negative Sputum 4/24 >>  UC 4/24 >>  C-Diff PCR 4/26 >> antigen + PCR positive, toxin negative  BAL 4/28 >>  ANTIBIOTICS: Vanco 4/17 >> 4/24 Zosyn 4/17 >> 4/29 Vanco (PO for equivocal C-diff PCR) 4/26 >>   LINES ETT 4/17 >> 4/26 (failed, poor secretion clearance) ETT 4/26 >>    SIGNIFICANT EVENTS: 4/17  Admit to [facility]   DISCUSSION: 77 y/o F with underlying CAD, emphysema, tobacco abuse, gastric mass and recent candidal esophageal infection admitted on 4/17 to [facility] with respiratory distress, concern for aspiration and possible seizure.  Intubated.  Course complicated by AKI, AF, hypo/hypertension and difficulty weaning.  Tx to [facility] 4/24.   ASSESSMENT / PLAN:  PULMONARY A: Acute Hypoxic Respiratory Failure  RLL Aspiration PNA Bilateral Pleural Effusions - small Tobacco Abuse - 60+ years, heaviest 1.5 ppd, currently 0.5 ppd COPD /  Emphysema - noted on CT  P:   Overbreathing ventilator on PRVC. While respiratory pattern is abnormal, suspect that this is largely due to delirium.  - Continue current ventilator strategy. - CPT to help clear RLL. - Hold bronchodilators given tachycardia.  CARDIOVASCULAR A:  AF with RVR - currently SR, reportedly sensitive to albuterol  Prolonged QT - noted on EKG on admit, 499 CAD s/p Stent - noted on CT imaging  Aortic Aneurysm - noted on CT imaging  P:  As noted she has a great deal of hemodynamic lability. Some of this is related to agitation..   - Continue oral diltiazem and increase metoprolol to tid. Continue to diurese.   RENAL A:  AKI has improved. + fluid balance P:   - Aim for negative fluid balance.  GASTROINTESTINAL A:   At Risk Malnutrition  Equivocal C-Diff PCR  Hx GERD, Diverticulosis, Gastric Mass (EGD 4/4), Candidal Esophageal Infection  P:   NPO / OGT  TF per Nutrition  Pepcid for SUP  See ID   HEMATOLOGIC A:   Anemia - suspect element of chronic disease, phlebotomy  P:  Trend CBC   heparin SQ for DVT prophylaxis   INFECTIOUS A:   RLL Aspiration PNA  Equivocal C-Diff Testing - treatment initiated 4/26 P:   She is off of all antibiotics other than oral vancomycin which is in place due to the finding of a C. difficile there is not a toxin producer.  She has a persistent leukocytosis and I very much doubt C. difficile is the issue.  The culture from her BAL has not yet been finalized.  I have repeated blood cultures and urine culture today and ordered repeat pro calcitonin's.  If the provocation for her leukocytosis remains enigmatic, I will consider repeating her abdominal CT scan. Oral vanco for 10 days total for equivocal c-diff > diarrhea slowed, PCT negative, ? Active infection vs sensitive PCR (more likely). Suspect that she has been appropriately treated for pneumonia which was the inciting event.   ENDOCRINE A:   Hyperglycemia    P:    SSI, sensitive scale   NEUROLOGIC A: Delirium in context of critical illness from pneumonia with prolonged use of sedation and prior cerebrovascular disease. She has a distant right PCA infarct and recent left PCA infarct. P: Stop all sedative infusions and tolerate some respiratory discomfort as sedation clears.  CODE STATUS: discussed the patient's condition with son Gershon Mussel who indicated that the patient would not want a prolonged hospitalization with risk of nursing home placement at the end. I have indicated that her condition at the end of this week should inform decision to proceed with tracheostomy or to pursue a comfort care strategy.  This patient is critically ill  due to acute respiratory failure requiring mechanical ventilation and remains at risk for life threatening decompensation. Services included multidisciplinary rounding, data review and supervision of all care provided. Total critical care time 40 min excluding separately billable procedures.  Kipp Brood, MD [place] [facility] Pgr: [phone] or if no answer [phone] 01/11/2018, 11:15 AM

## 2018-01-11 NOTE — Progress Notes (Signed)
Nutrition Follow-up  DOCUMENTATION CODES:   Not applicable  INTERVENTION:   D/C Vital High Protein  Vital AF 1.2 @ 40 ml/hr (960 ml/day) 30 ml Prostat TID  Provides: 1452 kcal, 117 grams protein, and 778 ml free water.    NUTRITION DIAGNOSIS:   Inadequate oral intake related to inability to eat as evidenced by NPO status. Ongoing.   GOAL:   Patient will meet greater than or equal to 90% of their needs Progressing.   MONITOR:   TF tolerance, Vent status, I & O's  ASSESSMENT:   Pt with PMH of COPD, IBS, esophageal dysmotility who was transferred to [facility] for MRI.   Pt discussed during ICU rounds and with RN.  Per notes pt with recent EGD 4/4 with gastric mass who is recommended to have surgery, pt also found to have candidal esophageal infection Pt continues to have delirium from critical illness/PNA, now off all sedatives per MD and will monitor to determine [place].  Patient is currently intubated on ventilator support MV: 10 L/min Temp (24hrs), Avg:98.9 F (37.2 C), Min:97.5 F (36.4 C), Max:99.7 F (37.6 C)  Propofol: off   Medications reviewed and include: vitamin C 500 mg BID, pepcid, SSI, MVI CBG (last 3)  Recent Labs    01/10/18 2341 01/11/18 0339 01/11/18 0844  GLUCAP 154* 158* 192*   TF: Vital High Protein @ 40 ml/hr (960 ml/day) 30 ml Prostat daily Provides: 1060 kcal, 99 grams protein, and 802 ml free water.   NUTRITION - FOCUSED PHYSICAL EXAM:    Most Recent Value  Orbital Region  No depletion  Upper Arm Region  No depletion  Thoracic and Lumbar Region  No depletion  Buccal Region  Unable to assess  Temple Region  Mild depletion  Clavicle Bone Region  No depletion  Clavicle and Acromion Bone Region  No depletion  Scapular Bone Region  Unable to assess  Dorsal Hand  No depletion  Patellar Region  No depletion  Anterior Thigh Region  No depletion  Posterior Calf Region  No depletion  Edema (RD Assessment)  Moderate  Hair  Reviewed  Eyes   Unable to assess  Mouth  Unable to assess  Skin  Reviewed [bruising and skin tears]  Nails  Reviewed       Diet Order:   Diet Order           Diet NPO time specified  Diet effective now          EDUCATION NEEDS:   No education needs have been identified at this time  Skin:  Skin Assessment: Reviewed RN Assessment(MASD: perineum, skin very fragile )  Last BM:  4/27 type 7/small  Height:   Ht Readings from Last 1 Encounters:  12/24/2017 5\' 1"  (1.549 m)    Weight:   Wt Readings from Last 1 Encounters:  01/11/18 115 lb 8.3 oz (52.4 kg)    Ideal Body Weight:  47.7 kg  BMI:  Body mass index is 21.83 kg/m.  Estimated Nutritional Needs:   Kcal:  1411  Protein:  95-110 grams  Fluid:  > 1.5 L/day  Maylon Peppers RD, LDN, CNSC [phone] Pager [phone] After Hours Pager

## 2018-01-11 DEATH — deceased

## 2018-01-12 ENCOUNTER — Inpatient Hospital Stay (HOSPITAL_COMMUNITY): Payer: Medicare HMO

## 2018-01-12 LAB — GLUCOSE, CAPILLARY
GLUCOSE-CAPILLARY: 119 mg/dL — AB (ref 65–99)
GLUCOSE-CAPILLARY: 125 mg/dL — AB (ref 65–99)
GLUCOSE-CAPILLARY: 105 mg/dL — AB (ref 65–99)
Glucose-Capillary: 127 mg/dL — ABNORMAL HIGH (ref 65–99)
Glucose-Capillary: 140 mg/dL — ABNORMAL HIGH (ref 65–99)

## 2018-01-12 LAB — PROCALCITONIN: Procalcitonin: 0.16 ng/mL

## 2018-01-12 LAB — CALCIUM, IONIZED: Calcium, Ionized, Serum: 5.4 mg/dL (ref 4.5–5.6)

## 2018-01-12 MED ORDER — CHLORHEXIDINE GLUCONATE CLOTH 2 % EX PADS
6.0000 | MEDICATED_PAD | Freq: Every day | CUTANEOUS | Status: AC
Start: 2018-01-12 — End: 2018-01-12
  Administered 2018-01-12: 6 via TOPICAL

## 2018-01-12 MED ORDER — VITAL AF 1.2 CAL PO LIQD
1000.0000 mL | ORAL | Status: DC
  Administered 2018-01-12: 1000 mL

## 2018-01-12 MED ORDER — FUROSEMIDE 10 MG/ML IJ SOLN
40.0000 mg | Freq: Once | INTRAMUSCULAR | Status: AC
  Administered 2018-01-12: 40 mg via INTRAVENOUS
  Filled 2018-01-12: qty 4

## 2018-01-12 NOTE — Progress Notes (Signed)
eLink Physician-Brief Progress Note Patient Name: Melinda Schroeder DOB: August 16, 1941 MRN: [id number]   Date of Service  01/12/2018  HPI/Events of Note  Called by RT re: low Vt and high RR  eICU Interventions  Vent settings adjusted     Intervention Category Major Interventions: Respiratory failure - evaluation and management  Wilhelmina Mcardle 01/12/2018, 12:35 AM

## 2018-01-12 NOTE — Progress Notes (Signed)
PULMONARY / CRITICAL CARE MEDICINE   Name: Melinda Schroeder MRN: [id number] DOB: 1941/05/26    ADMISSION DATE:  12/15/2017  CHIEF COMPLAINT:  Respiratory distress, CVA, question of seizure  BRIEF SUMMARY:   77 year old female who presented to [facility] 4/17 via EMS with reports of respiratory distress.  Admitted from 4/17-4/24 at [facility] with acute respiratory failure in setting of suspected aspiration (RLL, NOS).  Hospital course notable for negative LP, negative CTA chest for PE, hypo/hypertension, AFwRVR, prolonged respiratory failure on vent, agitated delirium, new small left PCA territory infarct and possible aspiration while on vent.  Tx to [facility] on 4/24 for Neurology evaluation.    PMH -CAD, HTN, HLD, GERD, diverticulitis, esophageal dysmotility, prior esophageal dilatation, tobacco abuse, COPD.  On 4/4 she had an upper endoscopy by Dr. 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  SUBJECTIVE:   Now off all continuous sedation.  Did receive 4 doses of fentanyl in past 24h.  Episode of self terminating RVR.  Emesis last pm, tube feeds are on hold.  VITAL SIGNS: BP 126/77   Pulse (!) 140   Temp 98.3 F (36.8 C) (Axillary)   Resp 16   Ht _0  (1.549 m)   Wt 117 lb 8.1 oz (53.3 kg)   SpO2 98%   BMI 22.20 kg/m   HEMODYNAMICS:  On no vasopressors.   VENTILATOR SETTINGS: Vent Mode: PCV FiO2 (%):  [40 %] 40 % Set Rate:  [10 bmp-15 bmp] 15 bmp Vt Set:  [305 mL] 305 mL PEEP:  [5 cmH20] 5 cmH20 Plateau Pressure:  [14 cmH20-27 cmH20] 23 cmH20  INTAKE / OUTPUT: I/O last 3 completed shifts: In: 1799.3 [I.V.:429.3; NG/GT:1370] Out: 1925 [PPIRJ:1884]  PHYSICAL EXAMINATION: General: Appears frail. In moderate distress. HEENT: MM pink/moist, ETT in place. Neuro: opens eyes to voice, follows commands minimally, but unable to  maintain attention.  CV: s1s2 rrr, no m/r/g PULM: respiration appears labored but no adventitial sounds.  Bronchial breathing at R base. GI: tense with decreased bowel sounds. Tender to lower quadrants bilaterally. Extremities: warm/dry, anasarca edema  Skin: thin, fragile skin, multiple areas of ecchymosis, skin tears  LABS:  BMET Recent Labs  Lab 01/09/18 0604 01/10/18 0529 01/11/18 0533  NA 141 141 145  K 4.2 4.3 4.4  CL 101 103 103  CO2 32 32 33*  BUN 33* 40* 37*  CREATININE 0.72 0.72 0.64  GLUCOSE 153* 164* 191*    Electrolytes Recent Labs  Lab 01/08/18 0500 01/09/18 0604 01/10/18 0529 01/11/18 0533  CALCIUM 9.5 9.3 9.5 9.5  MG 1.6* 1.5*  --  1.8    CBC Recent Labs  Lab 01/09/18 0604 01/10/18 0529 01/11/18 0533  WBC 17.3* 15.5* 15.6*  HGB 7.9* 7.9* 7.5*  HCT 25.9* 25.9* 25.4*  PLT 283 279 286    Coag's Recent Labs  Lab 01/05/18 1200  APTT 24  INR 1.17    Sepsis Markers Recent Labs  Lab 01/10/18 1250 01/11/18 0533 01/12/18 0500  PROCALCITON 0.35 0.15 0.16    ABG Recent Labs  Lab 01/07/18 1511 01/08/18 0438 01/09/18 0340  PHART 7.440 7.483* 7.397  PCO2ART 50.1* 41.8 53.1*  PO2ART 91.0 84.0 74.8*    Liver Enzymes Recent Labs  Lab 01/11/18 0533  AST 41  ALT 105*  ALKPHOS 53  BILITOT 0.9  ALBUMIN 2.1*    Cardiac Enzymes No  results for input(s): TROPONINI, PROBNP in the last 168 hours.  Glucose Recent Labs  Lab 01/11/18 1218 01/11/18 1545 01/11/18 1949 01/11/18 2349 01/12/18 0342 01/12/18 0730  GLUCAP 163* 165* 160* 137* 127* 119*    Imaging No results found.  STUDIES:  CT Head w/o 4/17 >> old right occipital infarct, no acute intracranial abnormality.   CTA Chest 4/17 >> no PE, mild interstitial edema, focal atelectasis in the right lower lobe with question of an infected bleb in the right lower lobe, aortic atherosclerosis and emphysema, left ventricular hypertrophy.   CT Head 4/20 >> interval development of  acute early subacute small left PCA territory infarct involving the left occipital pole.  No associated hemorrhage or mass-effect.  This was new from the prior study on 4/17.  Chronic right PCA territory infarct, stable. 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  CT Chest 4/25 >> increased small bilateral effusions, consolidation in RLL, mild interstitial edema, small volume perihepatic ascites CTA Head/Neck 4/25 >> no emergent arterial findings, small left occipital infarct is enhancing consistent with subacute timing, advanced / extensive atherosclerosis, advanced narrowing of the proximal right PCA, remote right occipital infarct, emphysema   CULTURES:  [facility] (confirmed 4/24) CSF 4/17 >> rare WBC, no organisms, negative culture HSV CSF 4/17 >> negative  BCx2 4/17 >> negative  UC 4/17 >> negative  Flu 4/17 >> negative  MRSA PCR 4/17 >> positive  Tracheal Aspirate 4/22 >> normal flora    ........................ BCx2 4/24 >> negative Sputum 4/24 >>  UC 4/24 >>  C-Diff PCR 4/26 >> antigen + PCR positive, toxin negative  BAL 4/28 >>   ANTIBIOTICS: Vanco 4/17 >> 4/24 Zosyn 4/17 >> 4/29 Vanco (PO for equivocal C-diff PCR) 4/26 >>   LINES ETT 4/17 >> 4/26 (failed, poor secretion clearance) ETT 4/26 >>    SIGNIFICANT EVENTS: 4/17  Admit to [facility]   DISCUSSION: 77 y/o F with underlying CAD, emphysema, tobacco abuse, gastric mass and recent candidal esophageal infection admitted on 4/17 to [facility] with respiratory distress, concern for aspiration and possible seizure.  Intubated.  Course complicated by AKI, AF,  hypo/hypertension and difficulty weaning.  Tx to [facility] 4/24.   ASSESSMENT / PLAN:  PULMONARY A: Acute Hypoxic Respiratory Failure  RLL Aspiration PNA Bilateral Pleural Effusions - small Tobacco Abuse - 60+ years, heaviest 1.5 ppd, currently 0.5 ppd COPD / Emphysema - noted on CT  P:   Overbreathing ventilator on PRVC. While respiratory pattern is abnormal, suspect that this is largely due to delirium.  - Continue current ventilator strategy. - CPT to help clear RLL. - Hold bronchodilators given tachycardia. - Discussed situation again with son and daughter in law. Have planned for one-way extubation tomorrow.  CARDIOVASCULAR A:  AF with RVR - currently SR, reportedly sensitive to albuterol  Prolonged QT - noted on EKG on admit, 499 CAD s/p Stent - noted on CT imaging  Aortic Aneurysm - noted on CT imaging  P:  As noted she has a great deal of hemodynamic lability. Some of this is related to agitation..   - Continue oral diltiazem and increase metoprolol to tid. Continue to diurese.   RENAL A:  AKI has improved. + fluid balance P:   - Aim for negative fluid balance - diurese again today.  GASTROINTESTINAL A:   At Risk Malnutrition  Equivocal C-Diff PCR  Hx GERD, Diverticulosis, Gastric Mass (EGD 4/4), Candidal Esophageal Infection  Abdominal pain and vomiting, likely due to constipation. P:   NPO / OGT  KUB to rule out obstruction and evaluate stool burden.   HEMATOLOGIC A:   Anemia - suspect element of chronic disease, phlebotomy  P:  Trend CBC   heparin SQ for DVT prophylaxis   INFECTIOUS A:   RLL Aspiration PNA  Equivocal C-Diff Testing - treatment initiated 4/26 P:   She is off of all antibiotics other than oral vancomycin which is in place due to the finding of a C. difficile there is not a toxin producer.  She has a persistent leukocytosis and I very much doubt C. difficile is the issue.  The culture from her BAL has not yet been finalized.  I have  repeated blood cultures and urine culture today and ordered repeat pro calcitonin's.  If the provocation for her leukocytosis remains enigmatic, I will consider repeating her abdominal CT scan. Oral vanco for 10 days total for equivocal c-diff > diarrhea slowed, PCT negative, ? Active infection vs sensitive PCR (more likely). Suspect that she has been appropriately treated for pneumonia which was the inciting event.   ENDOCRINE A:   Hyperglycemia    P:   SSI, sensitive scale   NEUROLOGIC A: Delirium in context of critical illness from pneumonia with prolonged use of sedation and prior cerebrovascular disease. She has a distant right PCA infarct and recent left PCA infarct. P: Stop all sedative infusions and tolerate some respiratory discomfort as sedation clears. However, at this point, lack of neurological recovery portends a worse prognosis irrespective of cause.  CODE STATUS: discussed the patient's condition with son Gershon Mussel who indicated that the patient would not want a prolonged hospitalization with risk of nursing home placement at the end. I have indicated that her condition at the end of this week should inform decision to proceed with tracheostomy or to pursue a comfort care strategy.  This patient is critically ill due to acute respiratory failure requiring mechanical ventilation and remains at risk for life threatening decompensation. Services included multidisciplinary rounding, data review and supervision of all care provided. Total critical care time 40 min excluding separately billable procedures.  Kipp Brood, MD [facility] Pgr: [phone] or if no answer [phone] 01/12/2018, 10:22 AM

## 2018-01-12 NOTE — Progress Notes (Signed)
RT held off CPT d/t Pt unstable heart rhythm and rate.

## 2018-01-13 LAB — GLUCOSE, CAPILLARY
GLUCOSE-CAPILLARY: 115 mg/dL — AB (ref 65–99)
GLUCOSE-CAPILLARY: 141 mg/dL — AB (ref 65–99)
GLUCOSE-CAPILLARY: 178 mg/dL — AB (ref 65–99)
Glucose-Capillary: 136 mg/dL — ABNORMAL HIGH (ref 65–99)

## 2018-01-13 MED ORDER — LORAZEPAM 2 MG/ML IJ SOLN
2.0000 mg | INTRAMUSCULAR | Status: DC | PRN
  Administered 2018-01-14 – 2018-01-15 (×3): 2 mg via INTRAVENOUS
  Filled 2018-01-13 (×3): qty 1

## 2018-01-13 MED ORDER — MORPHINE SULFATE (PF) 4 MG/ML IV SOLN
2.0000 mg | INTRAVENOUS | Status: DC | PRN
  Administered 2018-01-13 – 2018-01-15 (×7): 2 mg via INTRAVENOUS
  Filled 2018-01-13 (×8): qty 1

## 2018-01-13 MED ORDER — MORPHINE SULFATE (PF) 4 MG/ML IV SOLN
INTRAVENOUS | Status: AC
  Administered 2018-01-13: 2 mg
  Filled 2018-01-13: qty 1

## 2018-01-13 NOTE — Progress Notes (Signed)
PULMONARY / CRITICAL CARE MEDICINE   Name: Melinda Schroeder MRN: [id number] DOB: 07-28-1941    ADMISSION DATE:  01/06/2018  CHIEF COMPLAINT:  Respiratory distress, CVA, question of seizure  BRIEF SUMMARY:   77 year old female who presented to [facility] 4/17 via EMS with reports of respiratory distress.  Admitted from 4/17-4/24 at [facility] with acute respiratory failure in setting of suspected aspiration (RLL, NOS).  Hospital course notable for negative LP, negative CTA chest for PE, hypo/hypertension, AFwRVR, prolonged respiratory failure on vent, agitated delirium, new small left PCA territory infarct and possible aspiration while on vent.  Tx to [facility] on 4/24 for Neurology evaluation.    PMH -CAD, HTN, HLD, GERD, diverticulitis, esophageal dysmotility, prior esophageal dilatation, tobacco abuse, COPD.  On 4/4 she had an upper endoscopy by Dr. 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  SUBJECTIVE:   Now off all continuous sedation.  More comfortable today following large bowel movement with enema yesterday. Denies pain and has only required once dose of fentanyl.  VITAL SIGNS: BP 116/85   Pulse 70   Temp (!) 97.4 F (36.3 C) (Oral)   Resp 17   Ht _0  (1.549 m)   Wt 123 lb 0.3 oz (55.8 kg)   SpO2 100%   BMI 23.24 kg/m   HEMODYNAMICS:  On no vasopressors.   VENTILATOR SETTINGS: Vent Mode: PCV FiO2 (%):  [40 %] 40 % Set Rate:  [15 bmp] 15 bmp PEEP:  [5 cmH20] 5 cmH20 Plateau Pressure:  [17 cmH20-22 cmH20] 18 cmH20  INTAKE / OUTPUT: I/O last 3 completed shifts: In: 1008.7 [I.V.:360; NG/GT:648.7] Out: 2625 [Urine:2625]  PHYSICAL EXAMINATION: General: Appears frail. In no distress today. HEENT: MM pink/moist, ETT in place. Neuro: opens eyes to voice, follows commands, better able to maintain attention.  CV: s1s2 rrr, no  m/r/g PULM: respiration unlabored. RSBI 70 on 5/5. Chest clear.  GI: tense with decreased bowel sounds. Tender to lower quadrants bilaterally. Extremities: warm/dry, anasarca edema  Skin: thin, fragile skin, multiple areas of ecchymosis, skin tears  LABS:  BMET Recent Labs  Lab 01/09/18 0604 01/10/18 0529 01/11/18 0533  NA 141 141 145  K 4.2 4.3 4.4  CL 101 103 103  CO2 32 32 33*  BUN 33* 40* 37*  CREATININE 0.72 0.72 0.64  GLUCOSE 153* 164* 191*    Electrolytes Recent Labs  Lab 01/08/18 0500 01/09/18 0604 01/10/18 0529 01/11/18 0533  CALCIUM 9.5 9.3 9.5 9.5  MG 1.6* 1.5*  --  1.8    CBC Recent Labs  Lab 01/09/18 0604 01/10/18 0529 01/11/18 0533  WBC 17.3* 15.5* 15.6*  HGB 7.9* 7.9* 7.5*  HCT 25.9* 25.9* 25.4*  PLT 283 279 286    Coag's No results for input(s): APTT, INR in the last 168 hours.  Sepsis Markers Recent Labs  Lab 01/10/18 1250 01/11/18 0533 01/12/18 0500  PROCALCITON 0.35 0.15 0.16    ABG Recent Labs  Lab 01/07/18 1511 01/08/18 0438 01/09/18 0340  PHART 7.440 7.483* 7.397  PCO2ART 50.1* 41.8 53.1*  PO2ART 91.0 84.0 74.8*    Liver Enzymes Recent Labs  Lab 01/11/18 0533  AST 41  ALT 105*  ALKPHOS 53  BILITOT 0.9  ALBUMIN 2.1*    Cardiac Enzymes No results for input(s): TROPONINI, PROBNP in the last 168 hours.  Glucose Recent Labs  Lab 01/12/18 1515 01/12/18  2014 01/13/18 0010 01/13/18 0412 01/13/18 0726 01/13/18 1211  GLUCAP 125* 105* 141* 115* 136* 178*    Imaging No results found.  STUDIES:  CT Head w/o 4/17 >> old right occipital infarct, no acute intracranial abnormality.   CTA Chest 4/17 >> no PE, mild interstitial edema, focal atelectasis in the right lower lobe with question of an infected bleb in the right lower lobe, aortic atherosclerosis and emphysema, left ventricular hypertrophy.   CT Head 4/20 >> interval development of acute early subacute small left PCA territory infarct involving the left  occipital pole.  No associated hemorrhage or mass-effect.  This was new from the prior study on 4/17.  Chronic right PCA territory infarct, stable. 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  CT Chest 4/25 >> increased small bilateral effusions, consolidation in RLL, mild interstitial edema, small volume perihepatic ascites CTA Head/Neck 4/25 >> no emergent arterial findings, small left occipital infarct is enhancing consistent with subacute timing, advanced / extensive atherosclerosis, advanced narrowing of the proximal right PCA, remote right occipital infarct, emphysema   CULTURES:  [facility] (confirmed 4/24) CSF 4/17 >> rare WBC, no organisms, negative culture HSV CSF 4/17 >> negative  BCx2 4/17 >> negative  UC 4/17 >> negative  Flu 4/17 >> negative  MRSA PCR 4/17 >> positive  Tracheal Aspirate 4/22 >> normal flora    ........................ BCx2 4/24 >> negative Sputum 4/24 >>  UC 4/24 >>  C-Diff PCR 4/26 >> antigen + PCR positive, toxin negative  BAL 4/28 >>   ANTIBIOTICS: Vanco 4/17 >> 4/24 Zosyn 4/17 >> 4/29 Vanco (PO for equivocal C-diff PCR) 4/26 >>   LINES ETT 4/17 >> 4/26 (failed, poor secretion clearance) ETT 4/26 >>    SIGNIFICANT EVENTS: 4/17  Admit to [facility]   DISCUSSION: 77 y/o F with underlying CAD, emphysema, tobacco abuse, gastric mass and recent candidal esophageal infection admitted on 4/17 to [facility] with respiratory distress, concern for aspiration and possible seizure.  Intubated.  Course complicated by AKI, AF, hypo/hypertension and difficulty weaning.  Tx to [facility] 4/24.   ASSESSMENT  / PLAN:  PULMONARY A: Acute Hypoxic Respiratory Failure  RLL Aspiration PNA Bilateral Pleural Effusions - small Tobacco Abuse - 60+ years, heaviest 1.5 ppd, currently 0.5 ppd COPD / Emphysema - noted on CT  P:   - Discussed situation with son and daughter in law. For planned on-way extubation today.   Actually looks the best that she has and now may best time to attempt extubation.  CARDIOVASCULAR A:  AF with RVR - currently SR, reportedly sensitive to albuterol  Prolonged QT - noted on EKG on admit, 499 CAD s/p Stent - noted on CT imaging  Aortic Aneurysm - noted on CT imaging  P:  As noted she has a great deal of hemodynamic lability. Some of this is related to agitation..   - Continue oral diltiazem and increase metoprolol to tid.  RENAL A:  AKI has improved. + fluid balance.  Mild metabolic alkalosis - possibly due to contraction.  P:  Allow auto-regulation.   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 P:   NPO / OGT  KUB to rule out obstruction and evaluate stool burden.   HEMATOLOGIC A:   Anemia - suspect element of chronic disease, phlebotomy  P:  Trend CBC   heparin SQ for DVT prophylaxis   INFECTIOUS A:   RLL Aspiration PNA  Equivocal C-Diff Testing - treatment initiated 4/26 P:   She is off of all antibiotics other than oral vancomycin which is in place due to the finding of a C. difficile there is not a toxin producer.  She has a persistent leukocytosis and I very much doubt C. difficile is the issue.  The culture from her BAL has not yet been finalized.  I have repeated blood cultures and urine culture today and ordered repeat pro calcitonin's.  If the provocation for her leukocytosis remains enigmatic, I will consider repeating her abdominal CT scan. Oral vanco for 10 days total for equivocal  c-diff > diarrhea slowed, PCT negative, ? Active infection vs sensitive PCR (more likely). Suspect that she has been appropriately treated for pneumonia which was the inciting event.   ENDOCRINE A:   Hyperglycemia    P:   SSI, sensitive scale   NEUROLOGIC A: Delirium in context of critical illness from pneumonia with prolonged use of sedation and prior cerebrovascular disease. She has a distant right PCA infarct and recent left PCA infarct. P: Neurologically more awake. Strength improving.  CODE STATUS: discussed the patient's condition with son Gershon Mussel who indicated that the patient would not want a prolonged hospitalization with risk of nursing home placement at the end.Will proceed with extubation today with understanding that we will not re-intubate in case of failure.   This patient is critically ill due to acute respiratory failure requiring mechanical ventilation and remains at risk for life threatening decompensation. Services included multidisciplinary rounding, data review and supervision of all care provided. Total critical care time 40 min excluding separately billable procedures.  Kipp Brood, MD  Pulmonary & Critical Care Pgr: [phone] or if no answer [phone] 01/13/2018, 1:24 PM

## 2018-01-13 NOTE — Procedures (Signed)
Extubation Procedure Note  Patient Details:   Name: Melinda Schroeder DOB: [birth year] MRN: [id number]   Airway Documentation:   Positive cuff leak prior to extubation.  Vent end date: 01/13/18 Vent end time: 1350   Evaluation  O2 sats: stable throughout Complications: No apparent complications Patient did tolerate procedure well. Bilateral Breath Sounds: Diminished   Yes- pt has clear ability to speak.   Pt extubated to 4L [place] with RN at bedside. No signs of distress or stridor noted.   Elwin Mocha 01/13/2018, 1:54 PM

## 2018-01-13 NOTE — Progress Notes (Signed)
   01/13/18 1500  Clinical Encounter Type  Visited With Patient and family together  Visit Type Patient actively dying  Referral From Nurse  Consult/Referral To Chaplain  Spiritual Encounters  Spiritual Needs Prayer   Responded to a SCC for EOL.  Patient had been extubated and family was present.  Patient was semi alert and we had prayer together.  The family pastor has visited many times.  Will follow and support as needed. Chaplain Katherene Ponto

## 2018-01-14 MED ORDER — MORPHINE 100MG IN NS 100ML (1MG/ML) PREMIX INFUSION
2.0000 mg/h | INTRAVENOUS | Status: DC
  Administered 2018-01-14: 1 mg/h via INTRAVENOUS
  Filled 2018-01-14: qty 100

## 2018-01-14 NOTE — Progress Notes (Signed)
Pt transferred to 6n8 with morphine drip. Assessment stable at this time

## 2018-01-14 NOTE — Progress Notes (Signed)
PULMONARY / CRITICAL CARE MEDICINE   Name: Melinda Schroeder MRN: [id number] DOB: Jun 09, 1941    ADMISSION DATE:  12/16/2017  CHIEF COMPLAINT:  Respiratory distress, CVA, question of seizure  BRIEF SUMMARY:   77 year old female who presented to [facility] 4/17 via EMS with reports of respiratory distress.  Admitted from 4/17-4/24 at [facility] with acute respiratory failure in setting of suspected aspiration (RLL, NOS).  Hospital course notable for negative LP, negative CTA chest for PE, hypo/hypertension, AFwRVR, prolonged respiratory failure on vent, agitated delirium, new small left PCA territory infarct and possible aspiration while on vent.  Tx to [facility] on 4/24 for Neurology evaluation.    PMH -CAD, HTN, HLD, GERD, diverticulitis, esophageal dysmotility, prior esophageal dilatation, tobacco abuse, COPD.  On 4/4 she had an upper endoscopy by Dr. 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  SUBJECTIVE:   Patient has been extubated.  Patient has been started on morphine drip palliative  VITAL SIGNS: BP (!) 110/48   Pulse 93   Temp (!) 97.4 F (36.3 C) (Oral)   Resp 17   Ht _0  (1.549 m)   Wt 141 lb 15.6 oz (64.4 kg)   SpO2 100%   BMI 26.83 kg/m   HEMODYNAMICS:  On no vasopressors.   VENTILATOR SETTINGS:    INTAKE / OUTPUT: I/O last 3 completed shifts: In: 30 [I.V.:130; NG/GT:360] Out: 2425 [Urine:2425]  PHYSICAL EXAMINATION: General: Appears frail. In no distress today. HEENT: MM pink/moist, ETT in place. Neuro: Opens eyes to painful and verbal stimuli.  Lethargic.  Not follow commands CV: s1s2 rrr, no m/r/g PULM: Extubated.  Not in respiratory distress at this time appears comfortable GI: tense with decreased bowel sounds. Tender to lower quadrants bilaterally. Extremities: warm/dry, anasarca edema  Skin: thin, fragile  skin, multiple areas of ecchymosis, skin tears  LABS:  BMET Recent Labs  Lab 01/09/18 0604 01/10/18 0529 01/11/18 0533  NA 141 141 145  K 4.2 4.3 4.4  CL 101 103 103  CO2 32 32 33*  BUN 33* 40* 37*  CREATININE 0.72 0.72 0.64  GLUCOSE 153* 164* 191*    Electrolytes Recent Labs  Lab 01/08/18 0500 01/09/18 0604 01/10/18 0529 01/11/18 0533  CALCIUM 9.5 9.3 9.5 9.5  MG 1.6* 1.5*  --  1.8    CBC Recent Labs  Lab 01/09/18 0604 01/10/18 0529 01/11/18 0533  WBC 17.3* 15.5* 15.6*  HGB 7.9* 7.9* 7.5*  HCT 25.9* 25.9* 25.4*  PLT 283 279 286    Coag's No results for input(s): APTT, INR in the last 168 hours.  Sepsis Markers Recent Labs  Lab 01/10/18 1250 01/11/18 0533 01/12/18 0500  PROCALCITON 0.35 0.15 0.16    ABG Recent Labs  Lab 01/08/18 0438 01/09/18 0340  PHART 7.483* 7.397  PCO2ART 41.8 53.1*  PO2ART 84.0 74.8*    Liver Enzymes Recent Labs  Lab 01/11/18 0533  AST 41  ALT 105*  ALKPHOS 53  BILITOT 0.9  ALBUMIN 2.1*    Cardiac Enzymes No results for input(s): TROPONINI, PROBNP in the last 168 hours.  Glucose Recent Labs  Lab 01/12/18 1515 01/12/18 2014 01/13/18 0010 01/13/18 0412 01/13/18 0726 01/13/18 1211  GLUCAP 125* 105* 141* 115* 136* 178*    Imaging No results found.  STUDIES:  CT Head w/o 4/17 >> old right occipital infarct, no acute intracranial abnormality.   CTA  Chest 4/17 >> no PE, mild interstitial edema, focal atelectasis in the right lower lobe with question of an infected bleb in the right lower lobe, aortic atherosclerosis and emphysema, left ventricular hypertrophy.   CT Head 4/20 >> interval development of acute early subacute small left PCA territory infarct involving the left occipital pole.  No associated hemorrhage or mass-effect.  This was new from the prior study on 4/17.  Chronic right PCA territory infarct, stable. CT Abd/Pelvis 4/20 >> trace bilateral pleural effusions, partial consolidation of the  right lower lung, nonobstructing right renal stones measuring up to 5 mm, diffuse to diverticulosis along the sigmoid colon without evidence of diverticulitis, aneurysmal dilatation of the infrarenal abdominal aorta to 3.3 cm new from 2013, trace ascites noted about the liver, mild soft tissue edema.  CT Chest 4/25 >> increased small bilateral effusions, consolidation in RLL, mild interstitial edema, small volume perihepatic ascites CTA Head/Neck 4/25 >> no emergent arterial findings, small left occipital infarct is enhancing consistent with subacute timing, advanced / extensive atherosclerosis, advanced narrowing of the proximal right PCA, remote right occipital infarct, emphysema   CULTURES:  [facility] (confirmed 4/24) CSF 4/17 >> rare WBC, no organisms, negative culture HSV CSF 4/17 >> negative  BCx2 4/17 >> negative  UC 4/17 >> negative  Flu 4/17 >> negative  MRSA PCR 4/17 >> positive  Tracheal Aspirate 4/22 >> normal flora    ........................ BCx2 4/24 >> negative Sputum 4/24 >>  UC 4/24 >>  C-Diff PCR 4/26 >> antigen + PCR positive, toxin negative  BAL 4/28 >>   ANTIBIOTICS: Vanco 4/17 >> 4/24 Zosyn 4/17 >> 4/29 Vanco (PO for equivocal C-diff PCR) 4/26 >>   LINES ETT 4/17 >> 4/26 (failed, poor secretion clearance) ETT 4/26 >>    SIGNIFICANT EVENTS: 4/17  Admit to [facility]   DISCUSSION: 77 y/o F with underlying CAD, emphysema, tobacco abuse, gastric mass and recent candidal esophageal infection admitted on 4/17 to [facility] with respiratory distress, concern for aspiration and possible seizure.  Intubated.  Course complicated by AKI, AF, hypo/hypertension and difficulty weaning.  Tx to [facility] 4/24.   ASSESSMENT / PLAN:  PULMONARY A: Acute Hypoxic Respiratory Failure  RLL Aspiration PNA Bilateral Pleural Effusions - small Tobacco Abuse - 60+ years, heaviest 1.5 ppd, currently 0.5 ppd COPD / Emphysema - noted on CT  P:   Patient has  been extubated on 01/13/2018.  Patient is not in respiratory distress at this time.  Patient has been started on morphine drip for comfort.  CARDIOVASCULAR A:  AF with RVR - currently SR, reportedly sensitive to albuterol  Prolonged QT - noted on EKG on admit, 499 CAD s/p Stent - noted on CT imaging  Aortic Aneurysm - noted on CT imaging  P:  All medications have been discontinued.  RENAL A:  AKI has improved. + fluid balance.  Mild metabolic alkalosis - possibly due to contraction.    GASTROINTESTINAL A:   At Risk Malnutrition  Equivocal C-Diff PCR  Hx GERD, Diverticulosis, Gastric Mass (EGD 4/4), Candidal Esophageal Infection  Abdominal pain and vomiting, likely due to constipation. P:      HEMATOLOGIC A:   Anemia - suspect element of chronic disease, phlebotomy  P:  No more blood draws.  Patient is hospice  INFECTIOUS A:   RLL Aspiration PNA  Equivocal C-Diff Testing - treatment initiated 4/26 P:   All antibiotics have been discontinued   ENDOCRINE A:   Hyperglycemia    P:  NEUROLOGIC A: Delirium in context of critical illness from pneumonia with prolonged use of sedation and prior cerebrovascular disease. She has a distant right PCA infarct and recent left PCA infarct. P: Patient is a poor mental status at this time  CODE STATUS: Patient extubated yesterday.  Patient will not be reintubated.  Patient family is having a difficult time with hospice.  We will order palliative eval.  Patient on a morphine drip for comfort.  Patient does not seem to be in any acute distress at this time.  Patient will be transferred to palliative floor.  Rojelio Brenner Pulmonary Critical Care Pager: [phone]  01/14/2018, 6:38 PM

## 2018-01-15 DIAGNOSIS — Z515 Encounter for palliative care: Secondary | ICD-10-CM

## 2018-01-15 DIAGNOSIS — I63412 Cerebral infarction due to embolism of left middle cerebral artery: Principal | ICD-10-CM

## 2018-01-15 DIAGNOSIS — Z7189 Other specified counseling: Secondary | ICD-10-CM

## 2018-01-15 DIAGNOSIS — G9340 Encephalopathy, unspecified: Secondary | ICD-10-CM

## 2018-01-15 LAB — CULTURE, BLOOD (ROUTINE X 2)
Culture: NO GROWTH
Culture: NO GROWTH

## 2018-01-15 MED ORDER — SODIUM CHLORIDE 0.9% FLUSH: 10.0000 mL | INTRAVENOUS | Status: DC | PRN

## 2018-01-15 MED ORDER — MORPHINE 100MG IN NS 100ML (1MG/ML) PREMIX INFUSION
2.0000 mg/h | INTRAVENOUS | Status: DC
Start: 2018-01-15 — End: 2018-01-15
  Filled 2018-01-15: qty 100

## 2018-01-18 ENCOUNTER — Telehealth: Payer: Self-pay

## 2018-01-18 NOTE — Telephone Encounter (Signed)
On 01/18/18 I received a d/c from [facility] (original). The d/c is for burial.  The patient is a patient of Doctor McQuaid.   The d/c will be taken to Pulmonary Unit for signature.  On 01/18/18 I received the d/c back from Doctor McQuiad.  I got the d/c ready and called the funeral home to let them know the d/c was mailed to vital records per the funeral home request.

## 2018-02-11 NOTE — Progress Notes (Signed)
Wasted 30ml of Morphine drip in sink. Witnessed by Valentino Saxon.

## 2018-02-11 NOTE — Consult Note (Signed)
Consultation Note Date: 2018-02-10   Patient Name: Melinda Schroeder  DOB: 09-12-1941  MRN: [id number]  Age / Sex: 77 y.o., female  PCP: Lowella Dandy, NP Referring Physician: Sampson Goon, MD  Reason for Consultation: Establishing goals of care  HPI/Patient Profile: 77 y.o. female  with past medical history of CAD s/p stenting, HTN, HLD, GERD, diverticulitis, esophageal dysmotility s/p dilation, COPD, and tobacco abuse. She was admitted to [facility] on 4/16 with respiratory failure and agonal breathing requiring intubation. There initially was concern for seizures. Head CT revealed L. PCA infarct. Patient was transferred to [facility] on 12/17/2017 for MRI, which showed acute L. Occipital lobe infarct. Her hospitalization has been complicated by prolonged VDRF, aspiration PNA, and C. Difficile colitis. She was transitioned to comfort care after failing extubation on 01/13/18.  Clinical Assessment and Goals of Care: Patient is on morphine drip at '1mg'$ /hr and just received prn bolus dose of '2mg'$ . Patient appears to be uncomfortable with labored breathing and restlessness.   I met with patient's husband, son, and multiple other members of the family. All confirm plan for comfort and end of life care. They ask about transfer to the hospice facility in [facility] and we discussed the risks vs benefits of this. At this point, I would not feel that patient would be stable enough for a 45 minute ride by ambulance. Family would like to keep her here for now and try to more aggressively manage her symptoms. They would be willing to reconsider transfer to hospice if patient's status changed.    Emotional support provided to family. Chaplain offered but declined. Case discussed with nursing.   SUMMARY OF RECOMMENDATIONS   1. Increase morphine infusion to '2mg'$ /hr. Continue bolus dosing prn for comfort.  2. Continue comfort care  and emotional support for family 3. DNR     Primary Diagnoses: Present on Admission: . Acute encephalopathy   I have reviewed the medical record, interviewed the patient and family, and examined the patient. The following aspects are pertinent.  Past Medical History:  Diagnosis Date  . Anginal pain ([place])    years ago  . Anxiety   . Bronchitis, allergic    11-27-15 MD visit -2 injections given , oral Levaquin at present.  [name] COPD (chronic obstructive pulmonary disease) ([place])   . Coronary artery disease    stent placed 15 years ago Dr Lia Foyer. Dr. Steward Ros seen 2 yrs ago- no recent problems or follow up cardiology visits.  . DDD (degenerative disc disease)   . Diverticulitis   . Esophageal dysmotilities   . Gastritis   . GERD (gastroesophageal reflux disease)   . Hypercholesterolemia   . Hypertension   . IBS (irritable bowel syndrome)   . Restless legs   . Sepsis ([place]) 02/2016  . Shortness of breath dyspnea    Social History   Socioeconomic History  . Marital status: Married    Spouse name: Not on file  . Number of children: 1  . Years of education: Not on  file  . Highest education level: Not on file  Occupational History  . Occupation: hair stylist  Social Needs  . Financial resource strain: Not on file  . Food insecurity:    Worry: Not on file    Inability: Not on file  . Transportation needs:    Medical: Not on file    Non-medical: Not on file  Tobacco Use  . Smoking status: Current Every Day Smoker    Packs/day: 1.00    Years: 50.00    Pack years: 50.00    Types: Cigarettes  . Smokeless tobacco: Never Used  Substance and Sexual Activity  . Alcohol use: No  . Drug use: No  . Sexual activity: Not on file  Lifestyle  . Physical activity:    Days per week: Not on file    Minutes per session: Not on file  . Stress: Not on file  Relationships  . Social connections:    Talks on phone: Not on file    Gets together: Not on file    Attends religious  service: Not on file    Active member of club or organization: Not on file    Attends meetings of clubs or organizations: Not on file    Relationship status: Not on file  Other Topics Concern  . Not on file  Social History Narrative  . Not on file   Family History  Problem Relation Age of Onset  . Emphysema Father   . Stomach cancer Brother   . Stroke Mother    Scheduled Meds: . nicotine  14 mg Transdermal Daily   Continuous Infusions: . sodium chloride Stopped (01/10/18 0900)  . morphine 1 mg/hr (01/14/18 1634)   PRN Meds:.sodium chloride, LORazepam, morphine injection, sodium chloride flush Medications Prior to Admission:  Prior to Admission medications   Medication Sig Start Date End Date Taking? Authorizing Provider  albuterol (PROVENTIL HFA;VENTOLIN HFA) 108 (90 Base) MCG/ACT inhaler Inhale 2 puffs into the lungs every 6 (six) hours as needed for wheezing or shortness of breath.    [provider]  ALPRAZolam Duanne Moron) 0.25 MG tablet Take 0.25 mg by mouth 3 (three) times daily as needed for anxiety.    [provider]  aspirin 81 MG chewable tablet Chew 1 tablet (81 mg total) by mouth daily. 02/27/16   Dhungel, Nishant, MD  cyanocobalamin (,VITAMIN B-12,) 1000 MCG/ML injection Inject 1,000 mcg into the muscle every 30 (thirty) days.    [provider]  HYDROcodone-acetaminophen (NORCO) 10-325 MG tablet Take 1 tablet by mouth 3 (three) times daily as needed for severe pain.    [provider]  losartan (COZAAR) 100 MG tablet Take 100 mg by mouth daily. 10/28/17   [provider]  magnesium oxide (MAG-OX) 400 MG tablet Take 400 mg by mouth daily.    [provider]  metoprolol succinate (TOPROL-XL) 100 MG 24 hr tablet Take 100 mg by mouth 2 (two) times daily. Take with or immediately following a meal.    [provider]  mirtazapine (REMERON) 15 MG tablet Take 15 mg by mouth at bedtime.    [provider]    nitroGLYCERIN (NITROSTAT) 0.4 MG SL tablet Place 0.4 mg under the tongue every 5 (five) minutes as needed for chest pain.    [provider]  Nutritional Supplements (ESTROVEN PO) Take 1 tablet by mouth at bedtime.    [provider]  omeprazole (PRILOSEC) 20 MG capsule Take 20 mg by mouth 2 (two) times  daily before a meal.     [provider]  PARoxetine (PAXIL) 20 MG tablet Take 20 mg by mouth every morning.     [provider]  potassium chloride (K-DUR,KLOR-CON) 10 MEQ tablet Take 10 mEq by mouth 2 (two) times daily.    [provider]  promethazine (PHENERGAN) 25 MG tablet Take 25 mg by mouth every 4 (four) hours as needed for nausea or vomiting. 12/01/17   [provider]  simvastatin (ZOCOR) 40 MG tablet Take 40 mg by mouth at bedtime.     [provider]   Allergies  Allergen Reactions  . Ace Inhibitors Cough  . Sulfa Antibiotics Nausea Only   Review of Systems  Unable to perform ROS   Physical Exam  Constitutional:  Critically ill appearing, appears uncomfortable  Pulmonary/Chest:  Breathing labored  Neurological:  Unresponsive  Skin:  Edematous and weeping UEs    Vital Signs: BP (!) 110/48   Pulse (!) 127   Temp 98.2 F (36.8 C) (Axillary)   Resp 19   Ht '5\' 1"'$  (1.549 m)   Wt 65.6 kg (144 lb 10 oz)   SpO2 (!) 85%   BMI 27.33 kg/m  Pain Scale: CPOT   Pain Score: Asleep   SpO2: SpO2: (!) 85 % O2 Device:SpO2: (!) 85 % O2 Flow Rate: .O2 Flow Rate (L/min): 3 L/min  IO: Intake/output summary:   Intake/Output Summary (Last 24 hours) at 2018/01/20 0920 Last data filed at 20-Jan-2018 0630 Gross per 24 hour  Intake 13.43 ml  Output 900 ml  Net -886.57 ml    LBM: Last BM Date: 01/12/18 Baseline Weight: Weight: 68 kg (149 lb 14.6 oz) Most recent weight: Weight: 65.6 kg (144 lb 10 oz)     Palliative Assessment/Data:   Flowsheet Rows     Most Recent Value  Intake Tab  Referral Department  Critical  care  Unit at Time of Referral  Med/Surg Unit  Palliative Care Primary Diagnosis  Sepsis/Infectious Disease  Date Notified  01/14/18  Palliative Care Type  New Palliative care  Reason for referral  Clarify Goals of Care  Date of Admission  12/15/2017  Date first seen by Palliative Care  01/20/2018  # of days Palliative referral response time  1 Day(s)  # of days IP prior to Palliative referral  10  Clinical Assessment  Palliative Performance Scale Score  10%  Pain Max last 24 hours  Not able to report  Pain Min Last 24 hours  Not able to report  Dyspnea Max Last 24 Hours  Not able to report  Dyspnea Min Last 24 hours  Not able to report  Nausea Max Last 24 Hours  Not able to report  Nausea Min Last 24 Hours  Not able to report  Anxiety Max Last 24 Hours  Not able to report  Anxiety Min Last 24 Hours  Not able to report  Other Max Last 24 Hours  Not able to report  Psychosocial & Spiritual Assessment  Palliative Care Outcomes  Patient/Family meeting held?  Yes  Who was at the meeting?  husband, son, multiple step children  Palliative Care Outcomes  Clarified goals of care, Improved pain interventions      Time In: 0800 Time Out: 0900 Time Total: 60 minutes Greater than 50%  of this time was spent counseling and coordinating care related to the above assessment and plan.  Signed by: Irean Hong, NP   Please contact Palliative Medicine Team phone  at [phone] for questions and concerns.  For individual provider: See Shea Evans

## 2018-02-11 NOTE — Plan of Care (Signed)
  Problem: Education: Goal: Knowledge of General Education information will improve Outcome: Progressing Note:  POC reviewed with family; pt. unresponsive/ nonverbal; pt. palliative care.

## 2018-02-11 NOTE — Progress Notes (Signed)
[place] PCCM Progress   Brief Summary: 77 year old female who presented to [facility] 4/17 via EMS with reports of respiratory distress.  Admitted from 4/17-4/24 at [facility] with acute respiratory failure in setting of suspected aspiration (RLL, NOS).  Hospital course notable for negative LP, negative CTA chest for PE, hypo/hypertension, AFwRVR, prolonged respiratory failure on vent, agitated delirium, new small left PCA territory infarct and possible aspiration while on vent.  Tx to [facility] on 4/24 for Neurology evaluation.  Transitioned to comfort care 5/3.     S:  Family at bedside.  Concerned about her breathing, asking to remove O2  O: Blood pressure (!) 110/48, pulse (!) 127, temperature 98.2 F (36.8 C), temperature source Axillary, resp. rate 19, height 5\' 1"  (1.549 m), weight 144 lb 10 oz (65.6 kg), SpO2 (!) 85 %.  General: frail elderly female in NAD  HEENT: MM pink/moist Neuro: no response to voice  CV: s1s2 rrr, no m/r/g PULM: shallow, tachypnea, mild abd accessory muscle use,  lungs bilaterally clear LE:XNTZ, non-tender, bsx4 active  Extremities: warm/dry, generalized 1+ edema, scattered bruises  Skin: no rashes or lesions   A: Acute Hypoxic Respiratory Failure Aspiration PNA AF with RVR  L PCA Territory CVA Agitated Delirium   P: Full comfort measures Adjust morphine gtt to allow RN to titrate for normal respiratory rate  PRN ativan  Family support  Ok to remove O2 when husband arrives / family asking to remove  Anticipate in hospital death  Noe Gens, NP-C [place] [facility] Pgr: [phone] or if no answer [phone] 01-23-18, 11:12 AM

## 2018-02-11 NOTE — Progress Notes (Signed)
1329 Patient passed at 1335 with family at bedside. MD notified. Family comforted and expressed their thanks to all the staff at [facility] during this time.

## 2018-02-11 NOTE — Death Summary Note (Signed)
DEATH SUMMARY   Patient Details  Name: Melinda Schroeder MRN: [id number] DOB: Nov 30, 1940  Admission/Discharge Information   Admit Date:  January 05, 2018  Date of Death: Date of Death: 2018/01/16  Time of Death: Time of Death: October 27, 1333  Length of Stay: 10-25-22  Referring Physician: Lowella Dandy, NP   Reason(s) for Hospitalization  Dyspnea  Diagnoses  Preliminary cause of death: Cerebellar infarction [facility]) Secondary Diagnoses (including complications and co-morbidities):  Active Problems:   Acute encephalopathy   Cerebral embolism with cerebral infarction   HCAP (healthcare-associated pneumonia)   Acute respiratory failure with hypoxemia [facility])   Palliative care encounter   Goals of care, counseling/discussion   Brief Hospital Course (including significant findings, care, treatment, and services provided and events leading to death)  Melinda Schroeder is a 77 y.o. year old female who was initially admitted to [facility] on April 17 after complaining of shortness of breath.  Not long after arrival she was noted to have a new small left PCA territory infarct.  She was transferred to [facility] on 01-06-75-2024 for further evaluation.  She had respiratory failure requiring mechanical ventilation.  She suffered from aspiration pneumonia.  She was noted to have centrilobular emphysema on CT scanning of the chest.  She required mechanical ventilation for several days.  Goals of care conversations were held with the patient's family who stated that the patient would not want prolonged heroic efforts.  Mechanical life support was withdrawn and comfort measures were initiated.  The patient died peacefully with family at the bedside.    Pertinent Labs and Studies  Significant Diagnostic Studies Ct Angio Head W Or Wo Contrast  Result Date: 01/05/2018 CLINICAL DATA:  Stroke follow-up EXAM: CT ANGIOGRAPHY HEAD AND NECK TECHNIQUE: Multidetector CT imaging of the head and neck was performed using the standard  protocol during bolus administration of intravenous contrast. Multiplanar CT image reconstructions and MIPs were obtained to evaluate the vascular anatomy. Carotid stenosis measurements (when applicable) are obtained utilizing NASCET criteria, using the distal internal carotid diameter as the denominator. CONTRAST:  15m ISOVUE-370 IOPAMIDOL (ISOVUE-370) INJECTION 76% COMPARISON:  Brain MRI from yesterday FINDINGS: CT HEAD FINDINGS 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 right occipital pole infarct. No hemorrhage, hydrocephalus, or collection. Vascular: Atherosclerotic calcification. Skull: Negative Sinuses: Mild mucosal thickening in the paranasal sinuses. Orbits: Negative Review of the MIP images confirms the above findings CTA NECK FINDINGS Aortic arch: Extensive atherosclerotic and irregular plaque. Three vessel branching. Right carotid system: Diffuse atheromatous wall thickening in the brachiocephalic to the ICA bulb. No flow limiting stenosis or ulceration. Negative for beading or dissection. Left carotid system: Atheromatous plaque causes mild narrowing of the proximal and distal common carotid. Diffusely patent ICA. No beading or dissection. Vertebral arteries: Extensive atherosclerosis along the proximal left subclavian without flow limiting stenosis. Atherosclerotic irregularity of bilateral V4 segments without flow limiting stenosis. No ulcerated finding. Skeleton: No acute finding. C4-5 ACDF with solid arthrodesis. C5-6 posterior fusion hardware without visible bony fusion. Other neck: Endotracheal and orogastric tubes in place. No acute finding. Upper chest: Emphysema and airway thickening. Small pleural effusions where seen. Review of the MIP images confirms the above findings CTA HEAD FINDINGS Anterior circulation: Mild atherosclerotic plaque on the carotid siphons. No branch occlusion or flow limiting stenosis. Posterior circulation: The left PCA is fetal type; no  visible left P1 segment. Dominant left vertebral artery with most of right vertebral flow into the PICA. There is advanced  narrowing of the proximal right P2 segment with some downstream flow visible. Venous sinuses: Patent Anatomic variants: As above Delay hip ed phase: There is enhancement at the left occipital infarct compatible with subacute timing. Review of the MIP images confirms the above findings IMPRESSION: 1. No emergent arterial finding. 2. The small left occipital infarct is enhancing consistent with subacute timing. The left PCA is fetal type. No flow limiting stenosis or definite embolic source seen in the left carotid circulation. 3. Advanced and extensive atherosclerosis with extensive irregular plaque in the visualized aorta. 4. Advanced narrowing of the proximal right PCA. Remote right occipital infarct. 5. Emphysema (ICD10-J43.9). Electronically Signed   By: Monte Fantasia M.D.   On: 01/05/2018 11:58   Dg Abd 1 View  Result Date: 01/12/2018 CLINICAL DATA:  Abdominal pain EXAM: ABDOMEN - 1 VIEW COMPARISON:  01/06/2018 FINDINGS: Nasogastric tube tip and side port at the level of the stomach. Central line with tip at the upper cavoatrial junction. Stable hazy appearance of the lower chest that is likely atelectasis and layering pleural fluid. Diffuse gaseous distention of the colon. Gas and stool reaches the rectum. No small bowel dilatation. IMPRESSION: Generalized gaseous distention of colon, favor mild colonic ileus. Nasogastric tube remains in good position. Electronically Signed   By: Monte Fantasia M.D.   On: 01/12/2018 12:02   Ct Angio Neck W Or Wo Contrast  Result Date: 01/05/2018 CLINICAL DATA:  Stroke follow-up EXAM: CT ANGIOGRAPHY HEAD AND NECK TECHNIQUE: Multidetector CT imaging of the head and neck was performed using the standard protocol during bolus administration of intravenous contrast. Multiplanar CT image reconstructions and MIPs were obtained to evaluate the vascular  anatomy. Carotid stenosis measurements (when applicable) are obtained utilizing NASCET criteria, using the distal internal carotid diameter as the denominator. CONTRAST:  8m ISOVUE-370 IOPAMIDOL (ISOVUE-370) INJECTION 76% COMPARISON:  Brain MRI from yesterday FINDINGS: CT HEAD FINDINGS 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 right occipital pole infarct. No hemorrhage, hydrocephalus, or collection. Vascular: Atherosclerotic calcification. Skull: Negative Sinuses: Mild mucosal thickening in the paranasal sinuses. Orbits: Negative Review of the MIP images confirms the above findings CTA NECK FINDINGS Aortic arch: Extensive atherosclerotic and irregular plaque. Three vessel branching. Right carotid system: Diffuse atheromatous wall thickening in the brachiocephalic to the ICA bulb. No flow limiting stenosis or ulceration. Negative for beading or dissection. Left carotid system: Atheromatous plaque causes mild narrowing of the proximal and distal common carotid. Diffusely patent ICA. No beading or dissection. Vertebral arteries: Extensive atherosclerosis along the proximal left subclavian without flow limiting stenosis. Atherosclerotic irregularity of bilateral V4 segments without flow limiting stenosis. No ulcerated finding. Skeleton: No acute finding. C4-5 ACDF with solid arthrodesis. C5-6 posterior fusion hardware without visible bony fusion. Other neck: Endotracheal and orogastric tubes in place. No acute finding. Upper chest: Emphysema and airway thickening. Small pleural effusions where seen. Review of the MIP images confirms the above findings CTA HEAD FINDINGS Anterior circulation: Mild atherosclerotic plaque on the carotid siphons. No branch occlusion or flow limiting stenosis. Posterior circulation: The left PCA is fetal type; no visible left P1 segment. Dominant left vertebral artery with most of right vertebral flow into the PICA. There is advanced narrowing of the proximal  right P2 segment with some downstream flow visible. Venous sinuses: Patent Anatomic variants: As above Delay hip ed phase: There is enhancement at the left occipital infarct compatible with subacute timing. Review of the MIP images confirms the above findings IMPRESSION: 1. No  emergent arterial finding. 2. The small left occipital infarct is enhancing consistent with subacute timing. The left PCA is fetal type. No flow limiting stenosis or definite embolic source seen in the left carotid circulation. 3. Advanced and extensive atherosclerosis with extensive irregular plaque in the visualized aorta. 4. Advanced narrowing of the proximal right PCA. Remote right occipital infarct. 5. Emphysema (ICD10-J43.9). Electronically Signed   By: Monte Fantasia M.D.   On: 01/05/2018 11:58   Ct Chest Wo Contrast  Result Date: 01/05/2018 CLINICAL DATA:  77 y/o  F; acute respiratory illness. EXAM: CT CHEST WITHOUT CONTRAST TECHNIQUE: Multidetector CT imaging of the chest was performed following the standard protocol without IV contrast. COMPARISON:  12/28/2017 CT chest. FINDINGS: Cardiovascular: Right PICC line tip in the right atrium. Dense mitral annular calcification. Mild to moderate coronary artery calcification. Normal heart size. No pericardial effusion. Normal caliber thoracic aorta and main pulmonary artery. Severe calcific atherosclerosis of the thoracic aorta. Mediastinum/Nodes: No enlarged mediastinal or axillary lymph nodes. Thyroid gland, trachea, and esophagus demonstrate no significant findings. Enteric tube tip in the distal esophagus. Stable position of endotracheal tube in mid trachea. Lungs/Pleura: Small bilateral pleural effusions. Dependent consolidation in the right lower lobe. Patchy ground-glass opacities in the left upper lobe. Centrilobular emphysema in the lung apices. Interval improvement in interstitial edema with minimal residual interlobular septal thickening. Upper Abdomen: Small volume of  perihepatic ascites. Musculoskeletal: No fracture is seen. IMPRESSION: 1. Increased small bilateral effusions and consolidation in the dependent right lower lobe which may represent associated atelectasis or pneumonia. 2. Interval improvement in interstitial edema mild residual interlobular septal thickening. 3. Small volume perihepatic ascites. 4. Coronary and aortic calcific atherosclerosis. 5. Enteric tube tip in lower esophagus, advancement recommended. Electronically Signed   By: Kristine Garbe M.D.   On: 01/05/2018 18:52   Mr Brain Wo Contrast  Addendum Date: 01/10/2018   ADDENDUM REPORT: 01/10/2018 22:41 ADDENDUM: Correction to the above sinus findings. There is a small amount of fluid within both mastoids. Moderate sphenoid sinus mucosal thickening. Electronically Signed   By: Ulyses Jarred M.D.   On: 01/10/2018 22:41   Result Date: 01/10/2018 CLINICAL DATA:  Altered mental status EXAM: MRI HEAD WITHOUT CONTRAST TECHNIQUE: Multiplanar, multiecho pulse sequences of the brain and surrounding structures were obtained without intravenous contrast. COMPARISON:  Brain MRI 12/24/2017 FINDINGS: BRAIN: The midline structures are normal. Persistent diffusion reduction in the left occipital lobe, in unchanged pattern from the prior examination. No new area of scheme E a. old right occipital lobe infarct associated encephalomalacia. No mass lesion, hydrocephalus, dural abnormality or extra-axial collection. Multifocal white matter hyperintensity, most commonly due to chronic ischemic microangiopathy. No age-advanced or lobar predominant atrophy. No chronic microhemorrhage or superficial siderosis. VASCULAR: Major intracranial arterial and venous sinus flow voids are preserved. SKULL AND UPPER CERVICAL SPINE: The visualized skull base, calvarium, upper cervical spine and extracranial soft tissues are normal. SINUSES/ORBITS: No fluid levels or advanced mucosal thickening. No mastoid or middle ear effusion.  Normal orbits. IMPRESSION: Unchanged appearance of left occipital lobe subacute infarct. No hemorrhage or mass effect. No new site of ischemia. Electronically Signed: By: Ulyses Jarred M.D. On: 01/10/2018 22:23   Mr Brain Wo Contrast  Result Date: 01/09/2018 CLINICAL DATA:  77 y/o F; respiratory failure and sedation, possible left occipital infarction. EXAM: MRI HEAD WITHOUT CONTRAST TECHNIQUE: Multiplanar, multiecho pulse sequences of the brain and surrounding structures were obtained without intravenous contrast. COMPARISON:  12/31/2017 CT head.  02/25/2016 MRI of the  head. FINDINGS: Brain: 15 mm focus of reduced diffusion in the left occipital lobe corresponding to lesion on CT compatible with acute/early subacute infarction. No associated hemorrhage or mass effect. No additional evidence for acute infarction. No abnormal susceptibility hypointensity to indicate intracranial hemorrhage. No extra-axial collection, hydrocephalus, focal mass effect, or effacement of basilar cisterns. Mild chronic microvascular ischemic changes and parenchymal volume loss of the brain for age. Chronic right occipital lobe infarction. Vascular: Normal flow voids. Skull and upper cervical spine: Normal marrow signal. Sinuses/Orbits: Small mastoid effusions and mucosal thickening of the paranasal sinuses, probably due to intubation. Other: None. IMPRESSION: 1. 15 mm acute/early subacute infarction within the left occipital lobe corresponding to abnormality on CT. No associated hemorrhage or mass effect. 2. Stable mild chronic microvascular ischemic changes and parenchymal volume loss of the brain for age. Stable chronic infarction in right occipital lobe. These results will be called to the ordering clinician or representative by the Radiologist Assistant, and communication documented in the PACS or zVision Dashboard. Electronically Signed   By: Kristine Garbe M.D.   On: 01/09/2018 19:03   Dg Chest Port 1 View  Result  Date: 01/10/2018 CLINICAL DATA:  Acute respiratory failure with hypoxia EXAM: PORTABLE CHEST 1 VIEW COMPARISON:  01/08/2018 FINDINGS: Endotracheal tube 1 cm above the carina. NG tube in the stomach. Central venous catheter tip at the cavoatrial junction unchanged. Progression of bibasilar airspace disease and bilateral pleural effusions since the prior study. No pneumothorax. IMPRESSION: Endotracheal tube approximately 1 cm above the carina. Progression of bibasilar airspace disease and bilateral pleural effusions. Electronically Signed   By: Franchot Gallo M.D.   On: 01/10/2018 09:42   Dg Chest Port 1 View  Result Date: 01/08/2018 CLINICAL DATA:  Atelectasis. EXAM: PORTABLE CHEST 1 VIEW COMPARISON:  January 06, 2018 FINDINGS: Stable ETT and right PICC line. The NG tube terminates below today's film. No pneumothorax. Layering effusions with underlying atelectasis, right greater than left, stable in the interval. Stable cardiomediastinal silhouette. No other acute abnormalities. IMPRESSION: 1. Support apparatus as above. 2. Right greater than left layering effusions with underlying atelectasis. 3. No other changes. Electronically Signed   By: Dorise Bullion III M.D   On: 01/08/2018 09:58   Dg Chest Port 1 View  Result Date: 01/06/2018 CLINICAL DATA:  Status post endotracheal tube placement. EXAM: PORTABLE CHEST 1 VIEW COMPARISON:  Radiograph of January 05, 2018. FINDINGS: Endotracheal and nasogastric tubes are in grossly good position. Right-sided PICC line is noted with distal tip in expected position of the SVC. No pneumothorax is noted. Minimal bibasilar subsegmental atelectasis and minimal pleural effusions are noted. Bony thorax is unremarkable. IMPRESSION: Stable support apparatus. Minimal bibasilar subsegmental atelectasis is noted with minimal pleural effusions. Electronically Signed   By: Marijo Conception, M.D.   On: 01/06/2018 11:29   Dg Chest Port 1 View  Result Date: 01/05/2018 CLINICAL DATA:   Respiratory insufficiency.  CVA. EXAM: PORTABLE CHEST 1 VIEW COMPARISON:  12/14/2017. FINDINGS: Endotracheal tube and NG tube in stable position. Right IJ line in stable position, tip in the right atrium. Heart size normal. Mild right base and effusion again noted. No interim change. No pneumothorax. Prior cervical spine fusion. IMPRESSION: 1. Lines and tubes in stable position. Right PICC line in unchanged position with tip in right atrium. 2. Right base infiltrate and small right pleural effusion again noted without interim change. Electronically Signed   By: [place]   On: 01/05/2018 09:51   Dg Chest [facility]  1 View  Result Date: 12/16/2017 CLINICAL DATA:  Acute respiratory insufficiency EXAM: PORTABLE CHEST 1 VIEW COMPARISON:  01/02/2018 FINDINGS: Right lower lobe opacity, likely reflecting a small right pleural effusion with associated atelectasis, although pneumonia is not excluded. Left lung is clear. No pneumothorax. The heart is normal in size. Endotracheal tube terminates 5.5 cm above the carina. Enteric tube courses into the stomach. Right arm PICC terminates at the cavoatrial junction. IMPRESSION: Endotracheal tube terminates 5.5 cm above the carina. Additional support apparatus as above. Right lower lobe opacity, likely reflecting a small right pleural effusion with associated atelectasis, although pneumonia is not excluded. Electronically Signed   By: Julian Hy M.D.   On: 12/13/2017 11:14   Dg Abd Portable 1v  Result Date: 01/06/2018 CLINICAL DATA:  Post tube placement. EXAM: PORTABLE ABDOMEN - 1 VIEW COMPARISON:  01/05/2018. FINDINGS: Orogastric tube tip lies within the stomach lying along the distal aspect of the greater curvature. Bowel gas pattern appears nonspecific. IMPRESSION: Orogastric tube in the stomach. Electronically Signed   By: Staci Righter M.D.   On: 01/06/2018 11:43   Dg Abd Portable 1v  Result Date: 01/05/2018 CLINICAL DATA:  Orogastric tube placement EXAM:  PORTABLE ABDOMEN - 1 VIEW COMPARISON:  CT 01/08/2018 FINDINGS: Targeted exam for gastric tube position. The tip and side port of a gastric tube are seen below the left hemidiaphragm in the expected location of the proximal stomach. Central line catheter tip terminates at the cavoatrial junction. There is cardiomegaly. Probable small right effusion. IMPRESSION: Tip and side port of a gastric tube are seen within the expected location of the proximal stomach in the left upper quadrant of the abdomen. Electronically Signed   By: Ashley Royalty M.D.   On: 01/05/2018 23:23    Microbiology Recent Results (from the past 240 hour(s))  Culture, bal-quantitative     Status: Abnormal   Collection Time: 01/08/18  3:17 PM  Result Value Ref Range Status   Specimen Description BRONCHIAL ALVEOLAR LAVAGE  Final   Special Requests Normal  Final   Gram Stain   Final    MODERATE WBC PRESENT, PREDOMINANTLY PMN RARE SQUAMOUS EPITHELIAL CELLS PRESENT NO ORGANISMS SEEN Performed at [facility], [address] [place], [place] [address]    Culture 5,000 COLONIES/mL CANDIDA TROPICALIS (A)  Final   Report Status 01/11/2018 FINAL  Final  Culture, Urine     Status: None   Collection Time: 01/10/18  2:03 PM  Result Value Ref Range Status   Specimen Description URINE, CATHETERIZED  Final   Special Requests Normal  Final   Culture   Final    NO GROWTH Performed at [facility], [place] [address] [place], [place] [address]    Report Status 01/11/2018 FINAL  Final  Culture, blood (routine x 2)     Status: None   Collection Time: 01/10/18  5:03 PM  Result Value Ref Range Status   Specimen Description BLOOD LEFT ANTECUBITAL  Final   Special Requests   Final    BOTTLES DRAWN AEROBIC ONLY Blood Culture results may not be optimal due to an inadequate volume of blood received in culture bottles   Culture   Final    NO GROWTH 5 DAYS Performed at [facility], [place] [address] [place], [place] [address]     Report Status 01-23-2018 FINAL  Final  Culture, blood (routine x 2)     Status: None   Collection Time: 01/10/18  5:10  PM  Result Value Ref Range Status   Specimen Description BLOOD LEFT ANTECUBITAL  Final   Special Requests   Final    BOTTLES DRAWN AEROBIC ONLY Blood Culture results may not be optimal due to an inadequate volume of blood received in culture bottles   Culture   Final    NO GROWTH 5 DAYS Performed at [place] [address], [place], [place] [address]    Report Status 02-07-18 FINAL  Final    Lab Basic Metabolic Panel: No results for input(s): NA, K, CL, CO2, GLUCOSE, BUN, CREATININE, CALCIUM, MG, PHOS in the last 168 hours. Liver Function Tests: No results for input(s): AST, ALT, ALKPHOS, BILITOT, PROT, ALBUMIN in the last 168 hours. No results for input(s): LIPASE, AMYLASE in the last 168 hours. No results for input(s): AMMONIA in the last 168 hours. CBC: No results for input(s): WBC, NEUTROABS, HGB, HCT, MCV, PLT in the last 168 hours. Cardiac Enzymes: No results for input(s): CKTOTAL, CKMB, CKMBINDEX, TROPONINI in the last 168 hours. Sepsis Labs: Recent Labs  Lab 01/12/18 0500  PROCALCITON 0.16    Procedures/Operations     Simonne Maffucci 01/18/2018, 3:09 PM

## 2018-02-11 DEATH — deceased

## 2019-05-05 IMAGING — CT CT CHEST W/O CM
2 of 3 series · 15 of 36 positions shown, 18 images · non-contrast
Comparison: 12/28/2017 CT chest.

CLINICAL DATA: 76 y/o  F; acute respiratory illness.

EXAM:
CT CHEST WITHOUT CONTRAST
TECHNIQUE: Multidetector CT imaging of the chest was performed following the
standard protocol without IV contrast.

[Series 3: chest w/o 2mm st · axial · non-contrast · 0.76mm/px · z∈[+995,+1287]mm · 12 of 172 slices shown, 15 images]
[im 13/172  mediastinal]
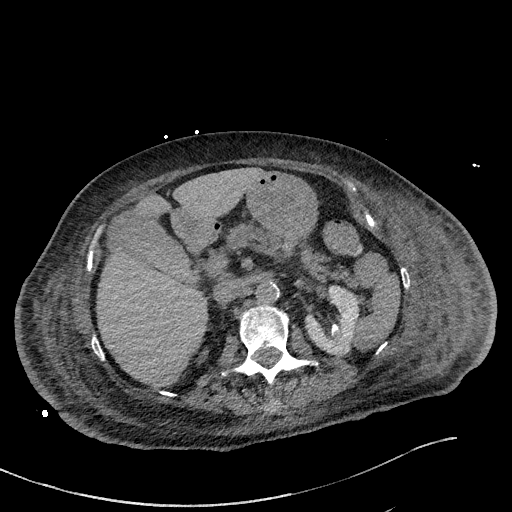
[im 13/172  lung]
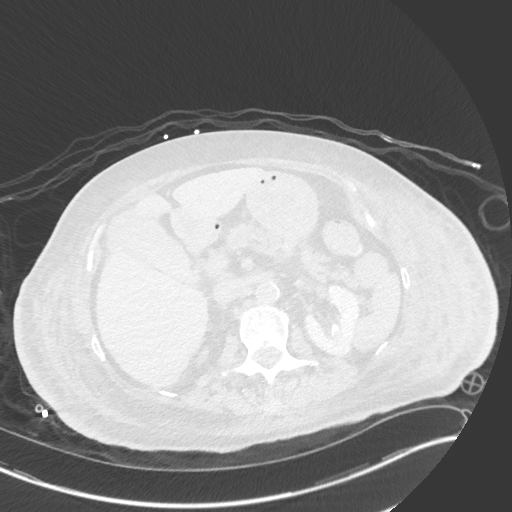
[im 26/172  lung]
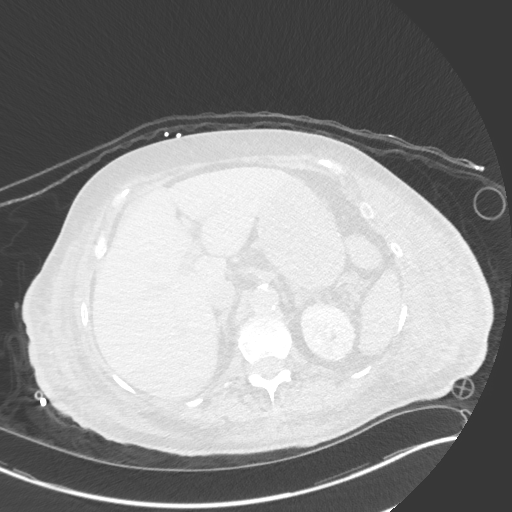
[im 39/172  lung]
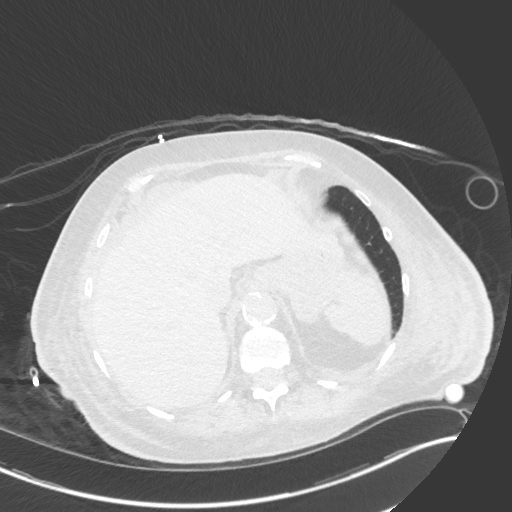
[im 51/172  lung]
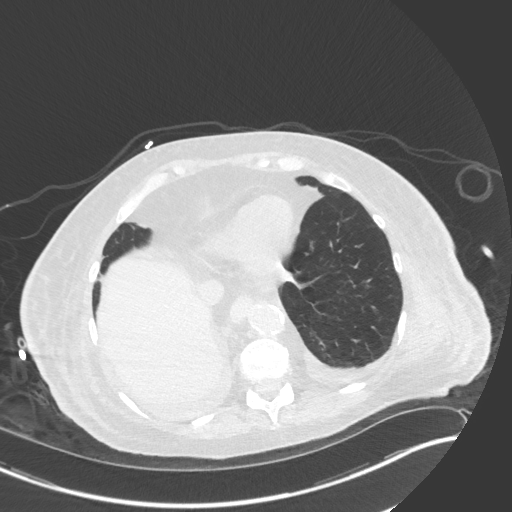
[im 64/172  mediastinal]
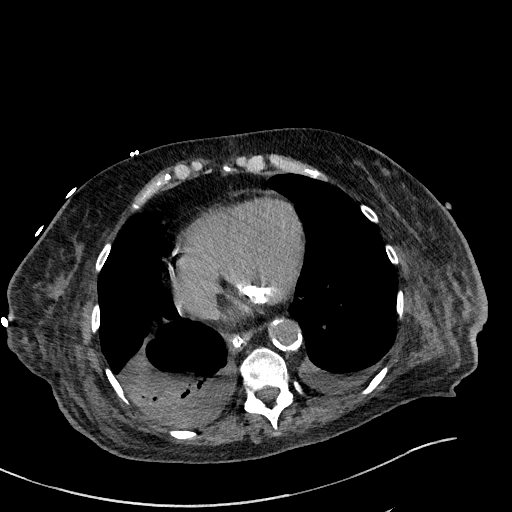
[im 64/172  lung]
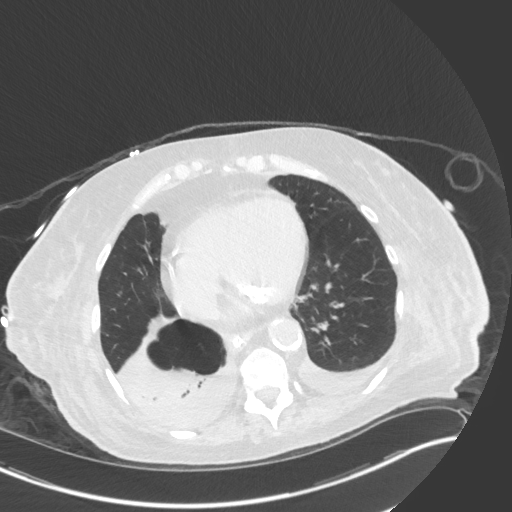
[im 77/172  lung]
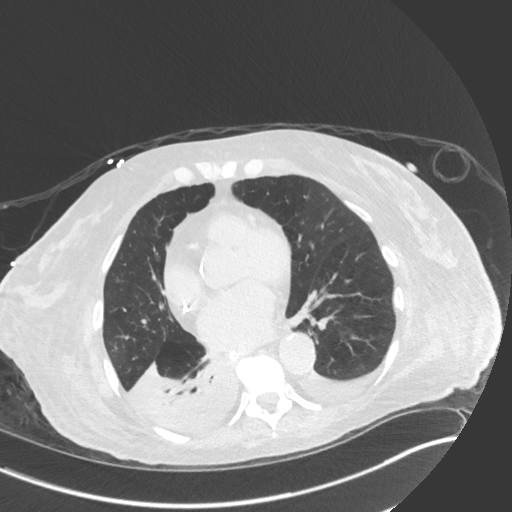
[im 96/172  lung]
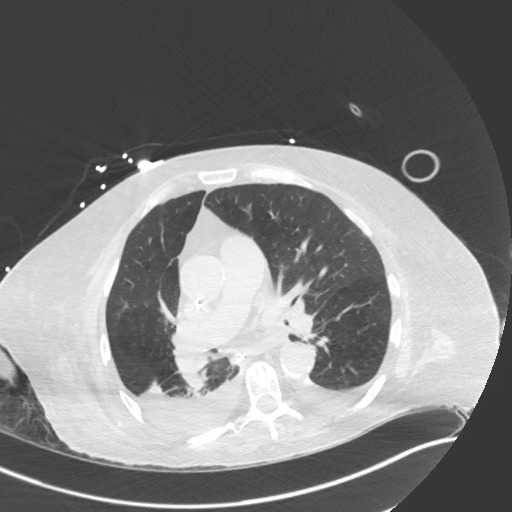
[im 108/172  lung]
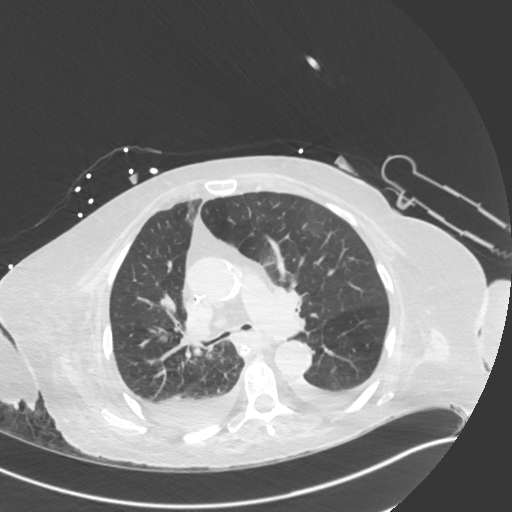
[im 121/172  mediastinal]
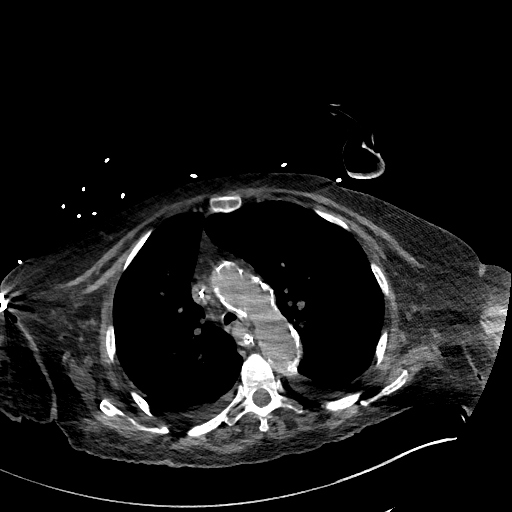
[im 121/172  lung]
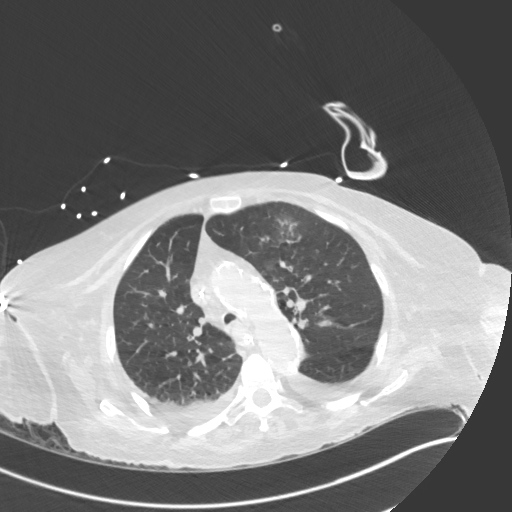
[im 134/172  lung]
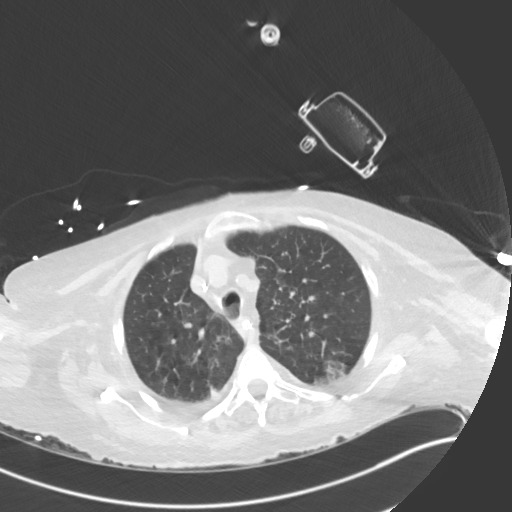
[im 146/172  lung]
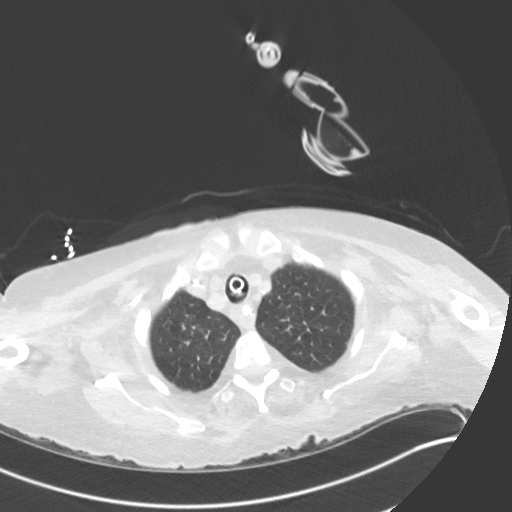
[im 159/172  lung]
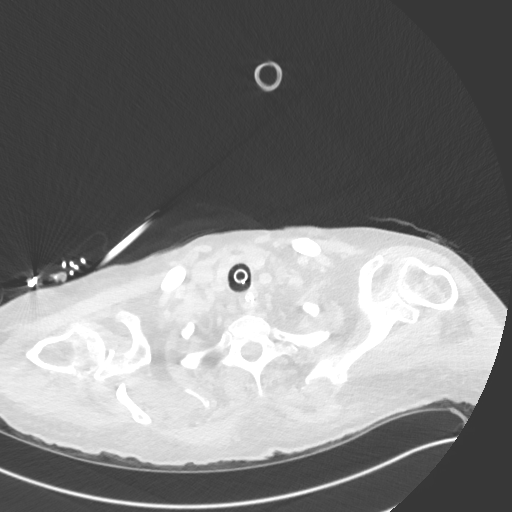

[Series 5: chest w/o 3mm st cor · coronal · non-contrast · 0.67mm/px · 3 of 100 slices shown]
[im 20/100  lung]
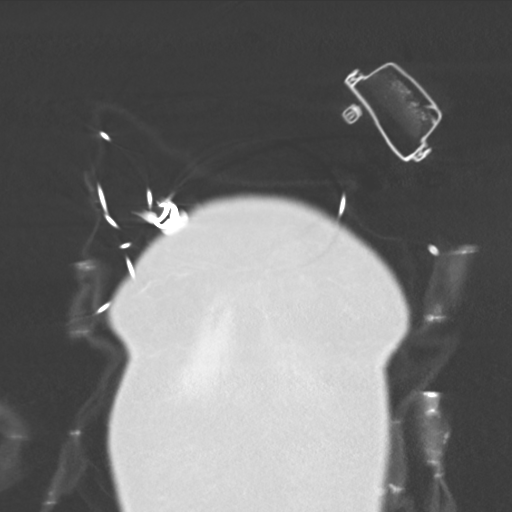
[im 40/100  lung]
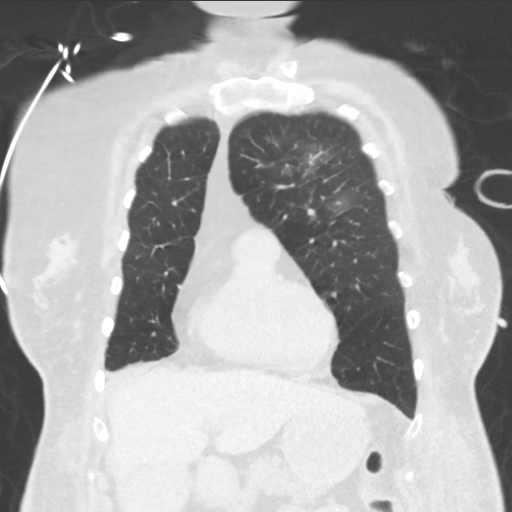
[im 60/100  lung]
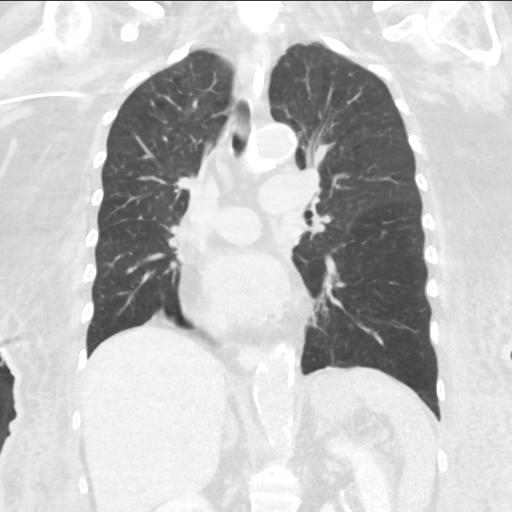

[15 of 36 positions shown; findings below may reference images not displayed]

FINDINGS: Cardiovascular: Right PICC line tip in the right atrium. Dense
mitral annular calcification. Mild to moderate coronary artery
calcification. Normal heart size. No pericardial effusion. Normal
caliber thoracic aorta and main pulmonary artery. Severe calcific
atherosclerosis of the thoracic aorta.

Mediastinum/Nodes: No enlarged mediastinal or axillary lymph nodes.
Thyroid gland, trachea, and esophagus demonstrate no significant
findings. Enteric tube tip in the distal esophagus. Stable position
of endotracheal tube in mid trachea.

Lungs/Pleura: Small bilateral pleural effusions. Dependent
consolidation in the right lower lobe. Patchy ground-glass opacities
in the left upper lobe. Centrilobular emphysema in the lung apices.
Interval improvement in interstitial edema with minimal residual
interlobular septal thickening.

Upper Abdomen: Small volume of perihepatic ascites.

Musculoskeletal: No fracture is seen.
IMPRESSION: 1. Increased small bilateral effusions and consolidation in the
dependent right lower lobe which may represent associated
atelectasis or pneumonia.
2. Interval improvement in interstitial edema mild residual
interlobular septal thickening.
3. Small volume perihepatic ascites.
4. Coronary and aortic calcific atherosclerosis.
5. Enteric tube tip in lower esophagus, advancement recommended.

By: Mirzalem Emer M.D.

## 2019-05-05 IMAGING — CT CT ANGIO HEAD
1 of 12 series · 5 of 47 positions shown · IV contrast (OMNI)
Comparison: Brain MRI from yesterday

CLINICAL DATA: Stroke follow-up

EXAM:
CT ANGIOGRAPHY HEAD AND NECK
TECHNIQUE: Multidetector CT imaging of the head and neck was performed using
the standard protocol during bolus administration of intravenous
contrast. Multiplanar CT image reconstructions and MIPs were
obtained to evaluate the vascular anatomy. Carotid stenosis
measurements (when applicable) are obtained utilizing NASCET
criteria, using the distal internal carotid diameter as the
denominator.
CONTRAST:  50mL ZQYQ61-21L IOPAMIDOL (ZQYQ61-21L) INJECTION 76%

[Series 6: carotid/brain 2.0 i30f 3 · axial · 0.40mm/px · z∈[-291,-67]mm · 5 of 169 slices shown]
[im 29/169  brain]
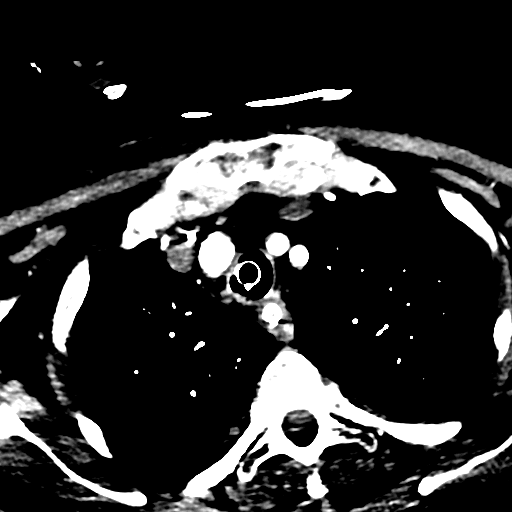
[im 57/169  bone]
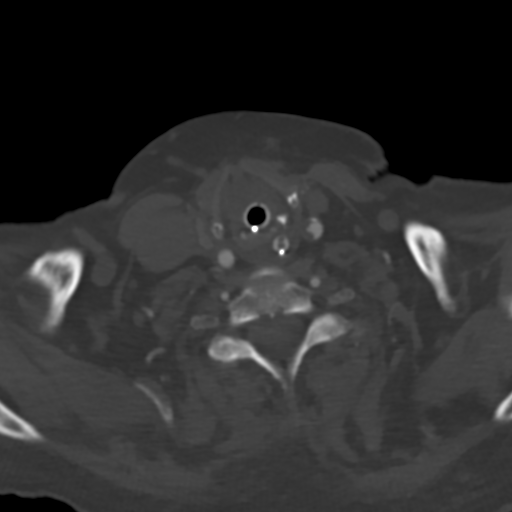
[im 85/169  brain]
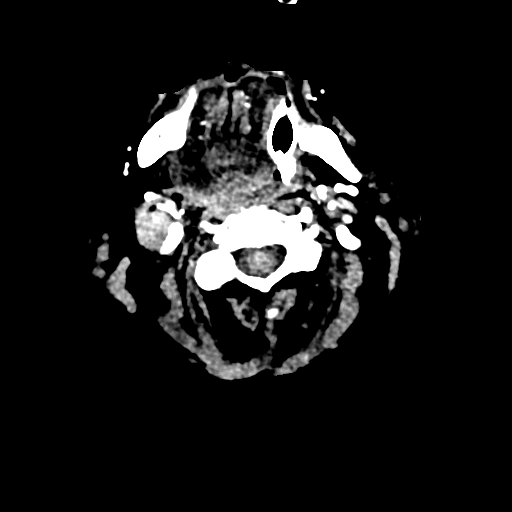
[im 113/169  bone]
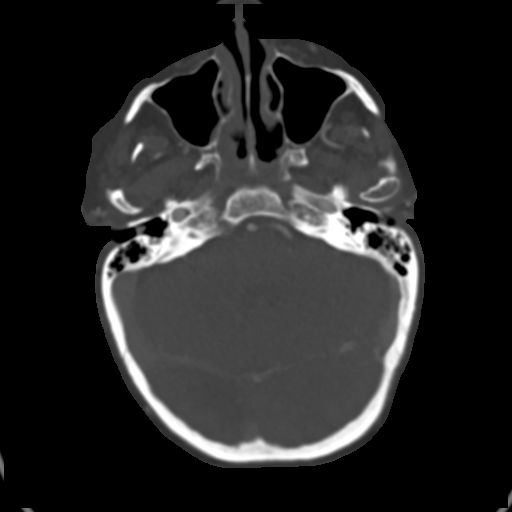
[im 141/169  brain]
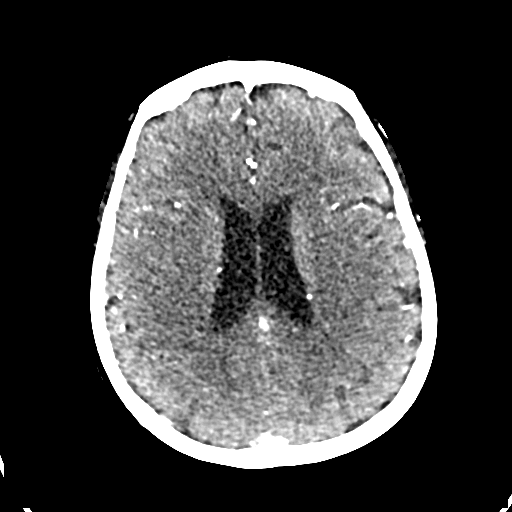

[5 of 47 positions shown; findings below may reference images not displayed]

FINDINGS: CT HEAD FINDINGS

Brain: Known recent infarct in the left occipital pole. Known remote
right occipital pole infarct. No hemorrhage, hydrocephalus, or
collection.

Vascular: Atherosclerotic calcification.

Skull: Negative

Sinuses: Mild mucosal thickening in the paranasal sinuses.

Orbits: Negative

Review of the MIP images confirms the above findings

CTA NECK FINDINGS

Aortic arch: Extensive atherosclerotic and irregular plaque. Three
vessel branching.

Right carotid system: Diffuse atheromatous wall thickening in the
brachiocephalic to the ICA bulb. No flow limiting stenosis or
ulceration. Negative for beading or dissection.

Left carotid system: Atheromatous plaque causes mild narrowing of
the proximal and distal common carotid. Diffusely patent ICA. No
beading or dissection.

Vertebral arteries: Extensive atherosclerosis along the proximal
left subclavian without flow limiting stenosis. Atherosclerotic
irregularity of bilateral V4 segments without flow limiting
stenosis. No ulcerated finding.

Skeleton: No acute finding. C4-5 ACDF with solid arthrodesis. C5-6
posterior fusion hardware without visible bony fusion.

Other neck: Endotracheal and orogastric tubes in place. No acute
finding.

Upper chest: Emphysema and airway thickening. Small pleural
effusions where seen.

Review of the MIP images confirms the above findings

CTA HEAD FINDINGS

Anterior circulation: Mild atherosclerotic plaque on the carotid
siphons. No branch occlusion or flow limiting stenosis.

Posterior circulation: The left PCA is fetal type; no visible left
P1 segment. Dominant left vertebral artery with most of right
vertebral flow into the PICA. There is advanced narrowing of the
proximal right P2 segment with some downstream flow visible.

Venous sinuses: Patent

Anatomic variants: As above

Delay hip ed phase: There is enhancement at the left occipital
infarct compatible with subacute timing.

Review of the MIP images confirms the above findings
IMPRESSION: 1. No emergent arterial finding.
2. The small left occipital infarct is enhancing consistent with
subacute timing. The left PCA is fetal type. No flow limiting
stenosis or definite embolic source seen in the left carotid
circulation.
3. Advanced and extensive atherosclerosis with extensive irregular
plaque in the visualized aorta.
4. Advanced narrowing of the proximal right PCA. Remote right
occipital infarct.
5. Emphysema ([id number]).

## 2019-05-06 IMAGING — DX DG ABD PORTABLE 1V
1 series · 1 of 1 positions shown · non-contrast
Comparison: 01/05/2018.

CLINICAL DATA: Post tube placement.

EXAM:
PORTABLE ABDOMEN - 1 VIEW

[abdomen kub]
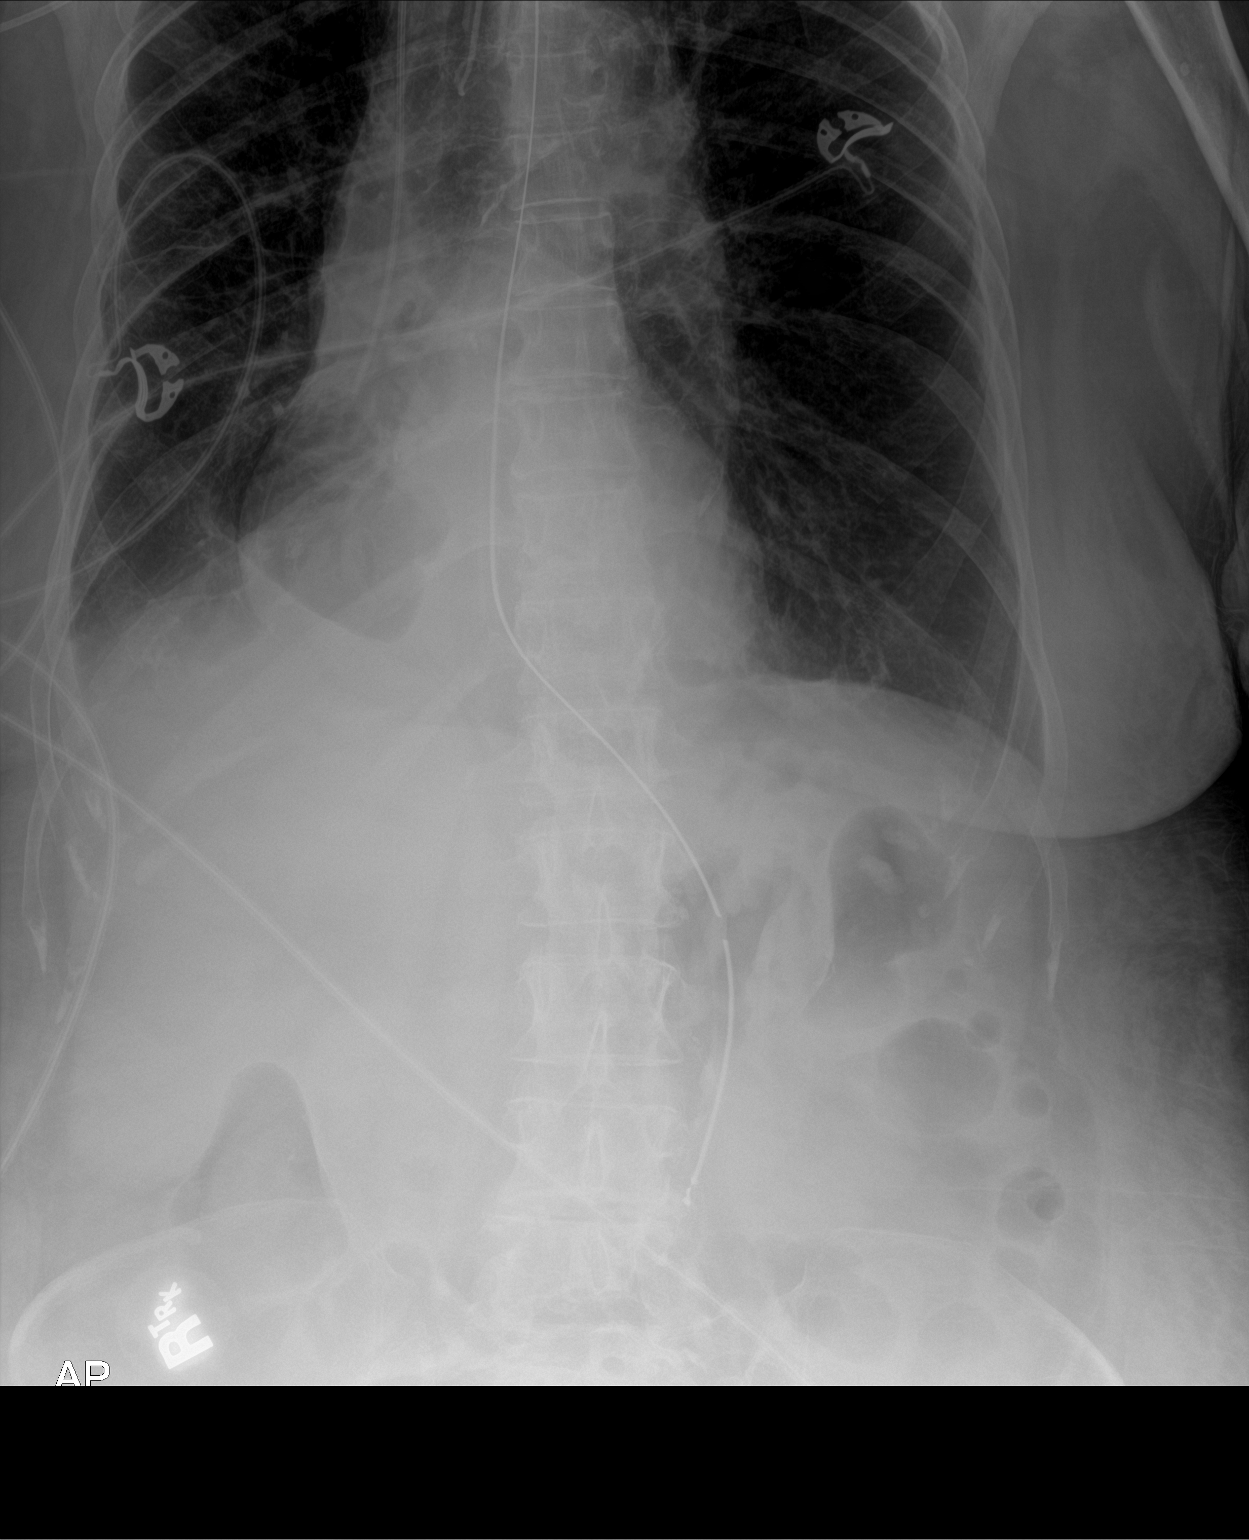

[1 of 1 positions shown; findings below may reference images not displayed]

FINDINGS: Orogastric tube tip lies within the stomach lying along the distal
aspect of the greater curvature. Bowel gas pattern appears
nonspecific.
IMPRESSION: Orogastric tube in the stomach.
